# Patient Record
Sex: Female | Born: 1960 | Race: Black or African American | Hispanic: No | Marital: Single | Smoking: Never smoker
Health system: Southern US, Community
[De-identification: ages and names within clinical notes are randomized; demographics above are authoritative.]

## PROBLEM LIST (undated history)

## (undated) DIAGNOSIS — T4145XA Adverse effect of unspecified anesthetic, initial encounter: Secondary | ICD-10-CM

## (undated) DIAGNOSIS — R064 Hyperventilation: Secondary | ICD-10-CM

## (undated) DIAGNOSIS — J45909 Unspecified asthma, uncomplicated: Secondary | ICD-10-CM

## (undated) DIAGNOSIS — R059 Cough, unspecified: Secondary | ICD-10-CM

## (undated) DIAGNOSIS — R51 Headache: Secondary | ICD-10-CM

## (undated) DIAGNOSIS — T8859XA Other complications of anesthesia, initial encounter: Secondary | ICD-10-CM

## (undated) DIAGNOSIS — N189 Chronic kidney disease, unspecified: Secondary | ICD-10-CM

## (undated) DIAGNOSIS — R05 Cough: Secondary | ICD-10-CM

## (undated) DIAGNOSIS — R519 Headache, unspecified: Secondary | ICD-10-CM

## (undated) DIAGNOSIS — N390 Urinary tract infection, site not specified: Secondary | ICD-10-CM

## (undated) DIAGNOSIS — R091 Pleurisy: Secondary | ICD-10-CM

## (undated) DIAGNOSIS — G4700 Insomnia, unspecified: Secondary | ICD-10-CM

## (undated) DIAGNOSIS — F419 Anxiety disorder, unspecified: Secondary | ICD-10-CM

## (undated) HISTORY — PX: UTERINE FIBROID SURGERY: SHX826

## (undated) HISTORY — PX: VESICOVAGINAL FISTULA CLOSURE W/ TAH: SUR271

## (undated) HISTORY — DX: Cough: R05

## (undated) HISTORY — PX: BREAST CYST ASPIRATION: SHX578

## (undated) HISTORY — PX: LUMBAR SPINE SURGERY: SHX701

## (undated) HISTORY — DX: Cough, unspecified: R05.9

## (undated) HISTORY — DX: Pleurisy: R09.1

## (undated) HISTORY — PX: ABDOMINAL HYSTERECTOMY: SHX81

## (undated) HISTORY — DX: Hyperventilation: R06.4

## (undated) HISTORY — DX: Insomnia, unspecified: G47.00

## (undated) HISTORY — DX: Unspecified asthma, uncomplicated: J45.909

---

## 1997-12-03 ENCOUNTER — Emergency Department (HOSPITAL_COMMUNITY): Admission: EM | Admit: 1997-12-03 | Discharge: 1997-12-03 | Payer: Self-pay | Admitting: Internal Medicine

## 1998-03-17 ENCOUNTER — Other Ambulatory Visit: Admission: RE | Admit: 1998-03-17 | Discharge: 1998-03-17 | Payer: Self-pay | Admitting: Obstetrics and Gynecology

## 1998-05-19 ENCOUNTER — Ambulatory Visit (HOSPITAL_COMMUNITY): Admission: RE | Admit: 1998-05-19 | Discharge: 1998-05-19 | Payer: Self-pay | Admitting: Internal Medicine

## 1998-05-19 ENCOUNTER — Encounter: Payer: Self-pay | Admitting: Internal Medicine

## 1998-12-24 ENCOUNTER — Encounter: Payer: Self-pay | Admitting: Obstetrics and Gynecology

## 1998-12-24 ENCOUNTER — Ambulatory Visit (HOSPITAL_COMMUNITY): Admission: RE | Admit: 1998-12-24 | Discharge: 1998-12-24 | Payer: Self-pay | Admitting: Obstetrics and Gynecology

## 1999-01-20 ENCOUNTER — Encounter (INDEPENDENT_AMBULATORY_CARE_PROVIDER_SITE_OTHER): Payer: Self-pay

## 1999-01-20 ENCOUNTER — Inpatient Hospital Stay (HOSPITAL_COMMUNITY): Admission: RE | Admit: 1999-01-20 | Discharge: 1999-01-26 | Payer: Self-pay | Admitting: Obstetrics and Gynecology

## 1999-01-23 ENCOUNTER — Encounter: Payer: Self-pay | Admitting: Neurology

## 1999-01-23 ENCOUNTER — Encounter: Payer: Self-pay | Admitting: Obstetrics and Gynecology

## 1999-03-25 ENCOUNTER — Other Ambulatory Visit: Admission: RE | Admit: 1999-03-25 | Discharge: 1999-03-25 | Payer: Self-pay | Admitting: Obstetrics and Gynecology

## 2000-03-17 ENCOUNTER — Encounter (INDEPENDENT_AMBULATORY_CARE_PROVIDER_SITE_OTHER): Payer: Self-pay | Admitting: Specialist

## 2000-03-17 ENCOUNTER — Encounter: Payer: Self-pay | Admitting: *Deleted

## 2000-03-17 ENCOUNTER — Encounter: Admission: RE | Admit: 2000-03-17 | Discharge: 2000-03-17 | Payer: Self-pay | Admitting: *Deleted

## 2000-03-17 ENCOUNTER — Other Ambulatory Visit: Admission: RE | Admit: 2000-03-17 | Discharge: 2000-03-17 | Payer: Self-pay | Admitting: *Deleted

## 2000-08-05 ENCOUNTER — Emergency Department (HOSPITAL_COMMUNITY): Admission: EM | Admit: 2000-08-05 | Discharge: 2000-08-06 | Payer: Self-pay | Admitting: Emergency Medicine

## 2000-12-25 ENCOUNTER — Emergency Department (HOSPITAL_COMMUNITY): Admission: EM | Admit: 2000-12-25 | Discharge: 2000-12-25 | Payer: Self-pay | Admitting: Emergency Medicine

## 2000-12-25 ENCOUNTER — Encounter: Payer: Self-pay | Admitting: Emergency Medicine

## 2001-09-03 ENCOUNTER — Emergency Department (HOSPITAL_COMMUNITY): Admission: EM | Admit: 2001-09-03 | Discharge: 2001-09-03 | Payer: Self-pay | Admitting: Emergency Medicine

## 2002-12-05 ENCOUNTER — Encounter: Payer: Self-pay | Admitting: Emergency Medicine

## 2002-12-05 ENCOUNTER — Emergency Department (HOSPITAL_COMMUNITY): Admission: EM | Admit: 2002-12-05 | Discharge: 2002-12-05 | Payer: Self-pay | Admitting: Emergency Medicine

## 2003-05-10 ENCOUNTER — Encounter: Admission: RE | Admit: 2003-05-10 | Discharge: 2003-05-10 | Payer: Self-pay | Admitting: Obstetrics and Gynecology

## 2004-10-08 ENCOUNTER — Ambulatory Visit: Payer: Self-pay | Admitting: Internal Medicine

## 2004-11-09 ENCOUNTER — Ambulatory Visit: Payer: Self-pay | Admitting: Internal Medicine

## 2004-12-10 ENCOUNTER — Ambulatory Visit: Payer: Self-pay | Admitting: Internal Medicine

## 2004-12-30 ENCOUNTER — Ambulatory Visit: Payer: Self-pay | Admitting: Internal Medicine

## 2005-04-14 ENCOUNTER — Encounter (INDEPENDENT_AMBULATORY_CARE_PROVIDER_SITE_OTHER): Payer: Self-pay | Admitting: *Deleted

## 2005-04-14 ENCOUNTER — Inpatient Hospital Stay (HOSPITAL_COMMUNITY): Admission: RE | Admit: 2005-04-14 | Discharge: 2005-04-17 | Payer: Self-pay | Admitting: Obstetrics and Gynecology

## 2005-05-24 ENCOUNTER — Ambulatory Visit: Payer: Self-pay | Admitting: Internal Medicine

## 2005-06-04 ENCOUNTER — Ambulatory Visit: Payer: Self-pay | Admitting: Internal Medicine

## 2005-07-01 ENCOUNTER — Ambulatory Visit: Payer: Self-pay | Admitting: Internal Medicine

## 2005-07-13 ENCOUNTER — Ambulatory Visit: Payer: Self-pay | Admitting: Internal Medicine

## 2005-07-22 ENCOUNTER — Encounter (HOSPITAL_COMMUNITY): Admission: RE | Admit: 2005-07-22 | Discharge: 2005-10-20 | Payer: Self-pay | Admitting: Internal Medicine

## 2005-08-04 ENCOUNTER — Ambulatory Visit (HOSPITAL_COMMUNITY): Admission: RE | Admit: 2005-08-04 | Discharge: 2005-08-04 | Payer: Self-pay | Admitting: Obstetrics and Gynecology

## 2005-08-31 ENCOUNTER — Ambulatory Visit: Payer: Self-pay | Admitting: Internal Medicine

## 2005-09-29 ENCOUNTER — Encounter: Admission: RE | Admit: 2005-09-29 | Discharge: 2005-09-29 | Payer: Self-pay | Admitting: Obstetrics and Gynecology

## 2005-11-08 ENCOUNTER — Ambulatory Visit: Payer: Self-pay | Admitting: Internal Medicine

## 2005-11-09 ENCOUNTER — Ambulatory Visit: Admission: RE | Admit: 2005-11-09 | Discharge: 2005-11-09 | Payer: Self-pay | Admitting: Gynecologic Oncology

## 2005-11-23 ENCOUNTER — Encounter (INDEPENDENT_AMBULATORY_CARE_PROVIDER_SITE_OTHER): Payer: Self-pay | Admitting: *Deleted

## 2005-11-23 ENCOUNTER — Inpatient Hospital Stay (HOSPITAL_COMMUNITY): Admission: RE | Admit: 2005-11-23 | Discharge: 2005-11-26 | Payer: Self-pay | Admitting: Obstetrics and Gynecology

## 2005-12-15 ENCOUNTER — Encounter: Admission: RE | Admit: 2005-12-15 | Discharge: 2005-12-15 | Payer: Self-pay | Admitting: Obstetrics and Gynecology

## 2006-01-04 ENCOUNTER — Ambulatory Visit: Admission: RE | Admit: 2006-01-04 | Discharge: 2006-01-04 | Payer: Self-pay | Admitting: Gynecologic Oncology

## 2007-04-20 ENCOUNTER — Ambulatory Visit: Payer: Self-pay | Admitting: Pulmonary Disease

## 2007-04-27 ENCOUNTER — Encounter: Admission: RE | Admit: 2007-04-27 | Discharge: 2007-04-27 | Payer: Self-pay | Admitting: Obstetrics and Gynecology

## 2007-05-15 DIAGNOSIS — R091 Pleurisy: Secondary | ICD-10-CM | POA: Insufficient documentation

## 2007-05-15 DIAGNOSIS — J45909 Unspecified asthma, uncomplicated: Secondary | ICD-10-CM

## 2007-05-15 DIAGNOSIS — R05 Cough: Secondary | ICD-10-CM

## 2007-05-15 DIAGNOSIS — R064 Hyperventilation: Secondary | ICD-10-CM

## 2007-07-28 ENCOUNTER — Encounter: Payer: Self-pay | Admitting: Internal Medicine

## 2007-07-31 ENCOUNTER — Encounter: Payer: Self-pay | Admitting: Internal Medicine

## 2008-02-14 ENCOUNTER — Ambulatory Visit: Admission: RE | Admit: 2008-02-14 | Discharge: 2008-02-14 | Payer: Self-pay | Admitting: Gynecologic Oncology

## 2008-02-14 ENCOUNTER — Ambulatory Visit (HOSPITAL_COMMUNITY): Admission: RE | Admit: 2008-02-14 | Discharge: 2008-02-14 | Payer: Self-pay | Admitting: Gynecology

## 2008-05-14 ENCOUNTER — Ambulatory Visit: Payer: Self-pay | Admitting: Internal Medicine

## 2008-05-14 ENCOUNTER — Inpatient Hospital Stay (HOSPITAL_COMMUNITY): Admission: EM | Admit: 2008-05-14 | Discharge: 2008-05-17 | Payer: Self-pay | Admitting: Emergency Medicine

## 2008-05-20 ENCOUNTER — Ambulatory Visit: Payer: Self-pay | Admitting: Internal Medicine

## 2008-05-20 DIAGNOSIS — G4700 Insomnia, unspecified: Secondary | ICD-10-CM | POA: Insufficient documentation

## 2008-05-24 ENCOUNTER — Telehealth (INDEPENDENT_AMBULATORY_CARE_PROVIDER_SITE_OTHER): Payer: Self-pay | Admitting: *Deleted

## 2008-07-20 ENCOUNTER — Emergency Department (HOSPITAL_COMMUNITY): Admission: EM | Admit: 2008-07-20 | Discharge: 2008-07-20 | Payer: Self-pay | Admitting: Emergency Medicine

## 2009-02-20 ENCOUNTER — Ambulatory Visit: Payer: Self-pay | Admitting: Internal Medicine

## 2009-03-12 ENCOUNTER — Encounter: Admission: RE | Admit: 2009-03-12 | Discharge: 2009-03-12 | Payer: Self-pay | Admitting: Obstetrics and Gynecology

## 2009-12-23 ENCOUNTER — Telehealth: Payer: Self-pay | Admitting: Internal Medicine

## 2009-12-23 ENCOUNTER — Ambulatory Visit: Payer: Self-pay | Admitting: Internal Medicine

## 2010-04-09 ENCOUNTER — Encounter: Admission: RE | Admit: 2010-04-09 | Discharge: 2010-04-09 | Payer: Self-pay | Admitting: Obstetrics and Gynecology

## 2010-07-19 ENCOUNTER — Encounter: Payer: Self-pay | Admitting: Family Medicine

## 2010-07-19 ENCOUNTER — Encounter: Payer: Self-pay | Admitting: Obstetrics and Gynecology

## 2010-07-20 ENCOUNTER — Encounter: Payer: Self-pay | Admitting: Obstetrics and Gynecology

## 2010-07-20 ENCOUNTER — Encounter: Payer: Self-pay | Admitting: Gynecology

## 2010-07-28 NOTE — Progress Notes (Signed)
Summary: appt  Phone Note Call from Patient Call back at Work Phone [phone]   Caller: Patient Call For: Marca Gadsby Reason for Call: Talk to Nurse Summary of Call: bronchial asthma, sob,cough chest pain, head congestion x 1 week.  Pt would like to see CDY today. Initial call taken by: Eugene Gavia,  December 23, 2009 9:03 AM  Follow-up for Phone Call        called and spoke with pt.  pt c/o headache, increased sob, tightness in chest, coughing up yellow sputum with occ streaks of bright red blood, fever yesterday of 102, sore throat and hoarseness.  symptoms started 1 week ago.  Pt would like to be seen today by CY. No appts avail.  Please advise.  Thanks.  allergies:  reglan, doxycycline, compazine, phenothiazine  ******* SICK   ***********   SICK ****************   SICK ***************  Additional Follow-up for Phone Call Additional follow up Details #1::        Please have pt come in today at 345pm.Katie [facility] CMA  December 23, 2009 11:04 AM    called and spoke with pt and she is aware per CY to come in today at 3:45.  pt voiced her understanding Randell Loop CMA  December 23, 2009 11:21 AM

## 2010-07-28 NOTE — Assessment & Plan Note (Signed)
Summary: cough/congestion/fever/pt here at 3:45 per CY/la   Primary Provider/Referring Provider:  none  CC:  ACCUTE:headache; cough-blood x 1 week; pain in chest; sore throat and hoarseness;feverish..  History of Present Illness: 04/20/07- HISTORY OF PRESENT ILLNESS:  The patient is a 50 year old African- American female patient of Dr. Maple Hudson who has a known history of asthma versus recurrent cough with vocal cord dysfunction syndrome who presents today for an acute office visit.  The patient complains that over the last two weeks, she has had cough, congestion, thick mucous, fever initially at TMAX of 103, now resolved over the last several days, hoarseness, and wheezing.  The patient denies any hemoptysis, orthopnea, PND, exertional chest pain, calf pain, or tenderness.  No recent travel or antibiotic use.  The patient has not been seen in the office in greater than a year and a half.  Reports that she has been doing exceptionally well up until recently.  CXR 04/20/07- NAD.  05/20/08- Asthma, VCD/ cough Hosp at [facility] last week for asthma after flu syndrome. C/O headache from sleep loss, chest sore, dry cough. Cough worse supine. Finished abx, Tamiflu, pred. Asks sleep aid. No flu vax yet.  17-Mar-2009- asthma, VCD/ cough 4 days acutely ill with fever, cough, nausea/ vomiting, hoarseness- no sick exposure. Says getting sicker daily. Using her albuterol twice daily, no other meds for this. Limited intake, mostly liquids. Thinks she coughed up some dark red blood 3-4 days ago, which she has done before with very hard cough.  December 23, 2009- Asthma, VCD/ cough Acute visit- 1 week of harsh cough- her typical pattern. Denies cold, heart burn or much time outdoors. She doesn't know what started it. Rescue inhaler does help. Went to urgent care- temp 103. Given Zpak, tessalon and depo inj.  CXR- told it was clear.Finished Zpak 4 days ago., Cough mostly dry. Has coughed some blood streaks in mucus a  few days. Tussive soreness mid anterior chest. Had itching rash on arm 2 weeks ago, now resolved. Onset was before meds. She has had epoisodes of itching in past with no rash.   Asthma History    Initial Asthma Severity Rating:    Age range: 12+ years    Symptoms: throughout the day    Nighttime Awakenings: 0-2/month    Interferes w/ normal activity: no limitations    SABA use (not for EIB): 0-2 days/week    Asthma Severity Assessment: Severe Persistent   Preventive Screening-Counseling & Management  Alcohol-Tobacco     Smoking Status: never  Current Medications (verified): 1)  Albuterol Sulfate (2.5 Mg/23ml) 0.083% Nebu (Albuterol Sulfate) .Marland Kitchen.. 1 Four Times A Day As Needed 2)  Temazepam 15 Mg Caps (Temazepam) .Marland Kitchen.. 1 or 2 For Sleep Occasionally, If Needed 3)  Proair Hfa 108 (90 Base) Mcg/act Aers (Albuterol Sulfate) .... 2 Puffs Four Times A Day As Needed  Allergies (verified): 1)  ! Reglan 2)  ! Doxycycline 3)  ! Compazine 4)  ! 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   Physical Exam  Additional Exam:  General: A/Ox3; pleasant and cooperative, NAD, heavy wig SKIN: no rash, lesions NODES: no lymphadenopathy HEENT: [place]/AT, EOM- WNL, Conjuctivae- clear, PERRLA, TM-WNL, Nose- clear, Throat- clear and wnl, very hoarse, , no drainage or exudate NECK: Supple w/ fair ROM, JVD- none, normal carotid impulses w/o bruits Thyroid- , no stridor CHEST: harsh paroxysmal cough HEART: RRR, no m/g/r heard ABDOMEN: Soft and nl; EAV:WUJW, nl pulses, no edema  NEURO: Grossly intact to observation      Impression & Recommendations:  Problem # 1:  COUGH (ICD-786.2)  Acute tracheobronchitis. These episodes start with fever and exertional dyspnea, suggesting a viral infection then an asthmatic bronchitis type progression. We will give tussionex and prednisone taper which she says work usually. We had gone through an aggressive workup 7-8 years ago with bronchoscopy and eval for GERD, which had not resulted in better options. for management.  Medications Added to Medication List This Visit: 1)  Tussionex Pennkinetic Er 8-10 Mg/44ml Lqcr (Chlorpheniramine-hydrocodone) .Marland Kitchen.. 1 teaspoon two times a day as needed cough 2)  Prednisone 10 Mg Tabs (Prednisone) .Marland Kitchen.. 1 tab four times daily x 2 days, 3 times daily x 2 days, 2 times daily x 2 days, 1 time daily x 2 days  Other Orders: Est. Patient Level III (11914)  Patient Instructions: 1)  Keep appointment in August 2)  scripts for prednisone taper and tussionex Prescriptions: PREDNISONE 10 MG TABS (PREDNISONE) 1 tab four times daily x 2 days, 3 times daily x 2 days, 2 times daily x 2 days, 1 time daily x 2 days  #20 x 0   Entered and Authorized by:   Waymon Budge MD   Signed by:   Waymon Budge MD on 12/23/2009   Method used:   Print then Give to Patient   RxID:   [id number] TUSSIONEX PENNKINETIC ER 8-10 MG/5ML LQCR (CHLORPHENIRAMINE-HYDROCODONE) 1 teaspoon  two times a day as needed cough  #200 ml x 1   Entered and Authorized by:   Waymon Budge MD   Signed by:   Waymon Budge MD on 12/23/2009   Method used:   Print then Give to Patient   RxID:   [id number]

## 2010-08-07 ENCOUNTER — Other Ambulatory Visit: Payer: Self-pay | Admitting: Family Medicine

## 2010-08-07 DIAGNOSIS — M79671 Pain in right foot: Secondary | ICD-10-CM

## 2010-08-15 ENCOUNTER — Other Ambulatory Visit: Payer: Self-pay

## 2010-11-10 NOTE — Assessment & Plan Note (Signed)
[facility]                             PULMONARY OFFICE NOTE   NAME:[name], [name]                       MRN:          [id number]  DATE:04/20/2007                            DOB:          1961-02-21    HISTORY OF PRESENT ILLNESS:  The patient is a 50 year old African-  American female patient of Dr. Maple Hudson who has a known history of asthma  versus recurrent cough with vocal cord dysfunction syndrome who presents  today for an acute office visit.  The patient complains that over the  last two weeks, she has had cough, congestion, thick mucous, fever  initially at TMAX of 103, now resolved over the last several days,  hoarseness, and wheezing.  The patient denies any hemoptysis, orthopnea,  PND, exertional chest pain, calf pain, or tenderness.  No recent travel  or antibiotic use.  The patient has not been seen in the office in  greater than a year and a half.  Reports that she has been doing  exceptionally well up until recently.   PAST MEDICAL HISTORY:  Reviewed.   CURRENT MEDICATIONS:  Reviewed.   PHYSICAL EXAMINATION:  GENERAL:  The patient is a pleasant female in no  acute distress.  VITAL SIGNS:  She is afebrile with stable vital signs.  O2 saturations  100% on room air.  HEENT:  Nasal mucosa with some mild erythema.  Nontender sinuses.  Posterior pharynx is clear.  NECK:  Supple without cervical adenopathy.  No JVD.  Negative nuchal  rigidity.  LUNGS:  Coarse breath sounds bilaterally with a few expiratory wheezes.  HEART:  Regular rate.  ABDOMEN:  Soft and nontender.  EXTREMITIES:  Warm without any calf cyanosis, clubbing, or edema.   IMPRESSION:  Asthmatic bronchitic exacerbation.  Chest x-ray is pending  at the time of dictation.  The patient is begin Levaquin 750 mg x5 days,  Mucinex DM twice a day, Tussionex 8 ounces one teaspoon every 12 hours  as needed for cough control.  Prednisone taper over the next week.  The  patient will  return back with Dr. Maple Hudson in one month or sooner if  needed.      Rubye Oaks, NP  Electronically Signed      Clinton D. Maple Hudson, MD, Tonny Bollman, FACP  Electronically Signed   TP/MedQ  DD: 04/20/2007  DT: 04/21/2007  Job #: [id number]

## 2010-11-10 NOTE — Consult Note (Signed)
NAME:  [name], [name]                ACCOUNT NO.:  [id number]   MEDICAL RECORD NO.:  [id number]          PATIENT TYPE:  OUT   LOCATION:  GYN                          FACILITY:  [facility]   PHYSICIAN:  John T. Kyla Balzarine, M.D.    DATE OF BIRTH:  [birth year]   DATE OF CONSULTATION:  DATE OF DISCHARGE:                                 CONSULTATION   CHIEF COMPLAINT:  Persistent abdominal pain presenting over the past 6  weeks.   HISTORY OF PRESENT ILLNESS:  This patient underwent an exploratory  laparotomy performed by Dr. Ambrose Mantle and myself in May 2007, approximately  6 months after TAH and BSO.  She was found to have extensive adhesions  and underwent excision of a likely left pelvic ovarian remnant.  During  that surgery, we explored the right side wall and at that time there was  no indication of residual ovarian tissue.  The patient states that she  was doing well until approximately 6 weeks ago when she presented for  routine annual examination with complaints of fever and tender right  lower abdomen.  She was found to have urinary tract infection, treated  with antibiotics.  This was initially Macrobid, but she had an allergic  reaction and was changed to Cipro.  A course of Cipro was repeated one  month later for persistent bacteruria, though culture returned mixed  flora with low colony count.  The patient notes that since treatment for  urinary tract infection, she has had bilateral lower quadrant  throbbing abdominal pain which increases with activities such as  walking and intercourse.  She has had two days off of work because of  her pain.  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  She denies leg swelling or other symptoms.  She  denies obstructive bowel symptoms or change in bowel function.   PAST MEDICAL HISTORY:  Migraine headaches, borderline asthma with  environmental  allergies, prior postoperative pneumonia.  Remote history  of endometriosis, TAH/BSO and exploration for ovarian remnant as above.  Prior back surgery.   CURRENT MEDICATIONS:  1. Estradiol with tizanidine p.r.n. for migraine headaches.  2. She is using Tylenol over-the-counter for her pain.   ALLERGIES:  ANAPHYLAXIS WITH COMPAZINE, REGLAN, PHENOTHIAZINES AND  ERYTHROMYCIN.  QUESTIONABLE ALLERGY TO DOXYCYCLINE, AND SOME TYPE OF  REACTION TO MACROBID, UNKNOWN TYPE.   PERSONAL AND SOCIAL HISTORY:  Single and denies tobacco or significant  ethanol use.   FAMILY HISTORY:  Father with prostate cancer, but no known breast,  gynecologic or colon malignancies.   REVIEW OF SYSTEMS:  Otherwise, essentially negative comprehensive 10-  point review.   PHYSICAL EXAMINATION:  VITAL SIGNS:  Stable and afebrile with weight 160  pounds.  The patient is anxious, alert and oriented x3 in no acute  distress.  There is no supraclavicular or inguinal lymphadenopathy.  Back:  No spinous tenderness, but minimal tenderness to percussion in  the right CVA.  ABDOMEN:  Soft and benign with no distention,  ascites, mass or hernias  palpable.  Transverse incision is well-healed.  There is some fullness  in the right lower quadrant with slight tenderness.  No peritoneal  signs.  EXTREMITIES:  Full strength and range of motion without cords, Homans'  or edema.  PELVIC:  Vaginal vault, bladder and urethra and vulvar clear.  Bimanual  and rectovaginal examinations reveals minimally tender fullness in the  right adnexal region.  Because of voluntary guarding, no distinct mass  is palpable.   ASSESSMENT:  Recurrent lower abdominal/pelvic pain.  Prior history of  ovarian remnant cyst.   PLAN AND RECOMMENDATIONS:  We will obtain a pelvic ultrasound.  Urine  culture is repeated along with CBC and Chem panel.  Disposition will  depend on these findings.      John T. Kyla Balzarine, M.D.  Electronically Signed      JTS/MEDQ  D:  02/14/2008  T:  02/14/2008  Job:  [id number]   cc:   Telford Nab, R.N.  [address]  [place], [place] [address]   Malachi Pro. Ambrose Mantle, M.D.  Fax: [phone]

## 2010-11-10 NOTE — Discharge Summary (Signed)
[name], [name]                ACCOUNT NO.:  [id number]   MEDICAL RECORD NO.:  [id number]          PATIENT TYPE:  INP   LOCATION:  3740                         FACILITY:  [facility]   PHYSICIAN:  Madaline Guthrie, M.D.    DATE OF BIRTH:  [birth year]   DATE OF ADMISSION:  05/14/2008  DATE OF DISCHARGE:  05/17/2008                               DISCHARGE SUMMARY   DISCHARGE DIAGNOSES:  1. Acute asthma exacerbation.  2. Migraine headache.   DISCHARGE MEDICATIONS:  1. Tamiflu 75 mg 1 tablet twice a day for 2 more days.  2. Avelox 400 mg, take 1 tablet daily for 4 more days.  3. Tizanidine 4 mg, take 1 tablet every 6 hours as needed for migraine      headache.  4. Albuterol inhaler 1 puff every 4 hours as needed for shortness of      breath.  5. Prednisone, take 50 mg today; decreased by 10 mg daily for the next      5 days then stop.   The patient was instructed to stop taking the following medication:  Advair.   DISPOSITION AND FOLLOWUP:  [name] is being discharged in stable  condition.  She will see Dr. Maple Hudson of [facility] on May 20, 2008, at 3 p.m. to follow up on asthma management.  She will follow  up with Dr. Anne Fu [facility] on May 29, 2008, at 10:30 to follow up on management of her migraine headache.  She will follow up with Dr. Elizebeth Koller at the [facility] on June 10, 2008, at 3 p.m. for primary care followup.   PROCEDURES PERFORMED:  1. Chest x-ray, May 14, 2008, shows minimal atelectasis at lung      bases.  Negative for pneumonia.  No acute process.  2. Chest x-ray, May 15, 2008, shows improved lung volumes and      ventilation.  No significant change from previous.  3. Acute abdominal series, May 15, 2008, shows nonobstructive      bowel gas pattern.   CONSULTATIONS:  None.   HISTORY AND PHYSICAL:  [name] is a 50 year old female with past  medical history significant for  asthma, migraine headache, and recent  surgery for abdominal adhesion excision, who presents to the emergency  department after several days of increasing shortness of breath and  cough.   HOSPITAL COURSE:  1. Asthma exacerbation.  [name] is treated with continuous      albuterol nebulizer in the emergency department and was initially      admitted to the step-down unit.  She was started on IV Solu-Medrol,      continued on albuterol nebulizer treatments every 6 hours.  She was      started on Tamiflu and Avelox to cover possible underlying      influenza or pneumonia, which may have triggered this asthma      exacerbation.  She was switched to oral prednisone on day 3 of      hospitalization and was discharged on day 4 with 5-day  taper.  At      that time, her breathing was much improved.  Her Advair was      discontinued while she was in hospital as there has known in some      cases to trigger asthma exacerbation, and indeed she complained      that her Advair Diskus often made her cough.  2. Migraine headache.  In hospital, [name] reported a retro-      orbital headache consistent with her usual migraine headaches.  [facility]      [facility] and in the past has not      responded to sumatriptan.  She reported she had tried many      different medications for migraine headache and her pain is only      relieved by tizanidine and indeed upon receiving tizanidine in the      hospital, her symptoms resolved.   DISCHARGED LABS AND VITALS:  Temperature 98.5, blood pressure 118/69,  pulse 54, respirations 18, oxygen saturation 98% on room air.  Sodium  141, potassium 3.5, chloride 114, bicarb 20, BUN 11, creatinine 0.93,  glucose 106.  White blood cells 12.0, hemoglobin 10.1, hematocrit 29.5,  platelets 172.      Elby Showers, MD  Electronically Signed      Madaline Guthrie, M.D.  Electronically Signed    CW/MEDQ  D:  07/13/2008  T:  07/13/2008   Job:  [id number]

## 2010-11-13 NOTE — H&P (Signed)
NAME:  [name], STUEVE NO.:  [id number]   MEDICAL RECORD NO.:  [id number]          PATIENT TYPE:  INP   LOCATION:  NA                           FACILITY:  [facility]   PHYSICIAN:  Malachi Pro. Ambrose Mantle, M.D. DATE OF BIRTH:  1961-01-07   DATE OF ADMISSION:  04/14/2005  DATE OF DISCHARGE:                                HISTORY & PHYSICAL   HISTORY OF PRESENT ILLNESS:  This is a 50 year old, black, single female, P0  who is admitted to the hospital for hysterectomy with possible bilateral  salpingo-oophorectomy because of severe menorrhagia, dysmenorrhea, history  of myomectomies, pelvic pain, dyspareunia and history of pelvic  endometriosis.  Last menstrual period April 08, 2005, to the present.  The  patient states she has been having periods approximately every 2-3 weeks  lasting for more than a week at a time.  In September 2006, I attempted to  do a biopsy, but was unable to pass the sound through the uterine cervix.  Therefore, a D&C will be done prior to the hysterectomy.  When the patient  was seen in September 2006, the patient reported that she had with her  menstrual period 8-10 pads a day and rated her pain 7-8/10 and occasionally  10/10.  She also reported having dyspareunia and stated that she was using 6-  8 pain pills per day.  Examination was somewhat difficult, but I could not  be sure the uterus was enlarged, but I did offer her Lupron, but she wanted  to proceed with hysterectomy.   ALLERGIES:  REGLAN, COMPAZINE, DOXYCYCLINE AND PHENOTHIAZINES.   CURRENT MEDICATIONS:  She states she is taking no medications.   SOCIAL HISTORY:  She does not drink.  She does not smoke.   PAST MEDICAL HISTORY:  1.  She has had no major adult illness, but she has had a long history of      headaches thought to be migraine.  2.  She has no known heart problems.  3.  She had multiple laparoscopies in 1981, 1987 and 1995, all of which were      associated with laser  ablation of endometriosis.   PAST SURGICAL HISTORY:  1.  Laparoscopies on at least three occasions.  2.  Laparotomy with removal of 14 fibroids and a left ovarian cyst in 2000.  3.  Laminectomy.   FAMILY HISTORY:  Mother is 36 with diabetes, glaucoma, high blood pressure  and headaches.  Father died at 70 of prostate cancer.  One sister living and  well.  Four brothers apparently living and well.   PHYSICAL EXAMINATION:  GENERAL:  Well-developed, well-nourished, black  female.  VITAL SIGNS:  Height 5 feet 5 inches tall.  Weight 190 pounds.  Blood  pressure 140/80, pulse 60.  HEENT:  No cranial abnormalities.  Extraocular movements intact.  Nose and  pharynx clear.  NECK:  Supple without thyromegaly.  HEART:  Normal size and sounds.  No murmurs.  LUNGS:  Clear to auscultation.  Breaths soft without masses lying down or  sitting up.  ABDOMEN:  Soft.  There is some  lower quadrant direct tenderness with no  rebound.  It is difficult to tell whether there is a lower abdominal mass.  PELVIC:  The vulva and vagina are clean.  There is some menstrual blood  still present.  Pap smear on February 18, 2005, was within normal limits.  The  cervix is clean.  The uterus is of uncertain size, somewhat tender to  motion.  Adnexa with no masses palpable.  RECTAL:  Exam done in September without masses.   IMPRESSION:  1.  Pelvic pain.  2.  Severe dysmenorrhea and menorrhagia.  3.  Dyspareunia.  4.  History of pelvic endometriosis treated on three occasions with      laparoscopic laser ablation and a small amount of endometriosis noted at      the time of her multiple myomectomies and one of the implants on the      right uterosacral ligament at that time was cauterized.   PLAN:  The patient is admitted for [facility] and hysterectomy.  Removal of the  tubes and ovaries will be done if the patient's pelvic evaluation reveals a  process in which leaving the ovaries would work to her disadvantage such as   extensive endometriosis.  The patient, because of her prior myomectomies,  has had a bowel prep in case dissection of the bowel is necessary to reduce  the likelihood of colostomy in case large bowel injury is encountered.  The  patient has been informed of the risks of surgery including, but not limited  to heart attack, stroke, pulmonary embolus, wound disruption, hemorrhage  with need for reoperation and/or transfusion, fistula formation, nerve  injury, intestinal obstruction.  She understands and agrees to proceed.  She  states that her sex drive at age 56 was approximately once a month.  Now she  claims a sex drive of 3-4 times per week.  She has been informed that no  guarantee can be given as to the maintenance of her sex drive that she will  have to be prepared if the sex drive could diminished and that there is no  FDA approved medication for stimulating a woman's sex drive.  She  understands and agrees to proceed.      Malachi Pro. Ambrose Mantle, M.D.  Electronically Signed     TFH/MEDQ  D:  04/13/2005  T:  04/13/2005  Job:  [id number]

## 2010-11-13 NOTE — Discharge Summary (Signed)
NAMEMIDORI, DADO NO.:  [id number]   MEDICAL RECORD NO.:  [id number]          PATIENT TYPE:  INP   LOCATION:  1606                         FACILITY:  [facility]   PHYSICIAN:  Malachi Pro. Ambrose Mantle, M.D. 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  The patient underwent abdominal hysterectomy with BSO.  Findings at the time of surgery were multiple adhesions in the pelvis with  omental adhesions to the abdominal wall, adhesions of the omentum and the  bowel, to the uterus, adhesions of the ovaries to the pelvic wall, adhesions  of the right tube to the omentum.  The patient underwent D&C, laparotomy,  division of adhesions, abdominal hysterectomy and bilateral salpingo-  oophorectomy.  Postoperatively the patient's main complaint initially with  migraines.  She has been bothered with migraines for many years.  On the  second postoperative day, she did not want to go home.  She was ready to go  home on the third postoperative day and was discharged.  Initial hemoglobin  11.4, hematocrit 33.6, white count 4600, platelet count 261,000.  Followup  hematocrits were 27.9 and 25.1.  Differential was normal.  Comprehensive  metabolic profile was normal.  Urinalysis negative.  EKG and chest were not  done.  Pathology report is available.  The D&C showed benign fragments of  squamous mucosa and scant fragments of endocervical glands, benign fragments  of lower uterine segment endometrium, negative for hyperplasia or  malignancy, weakly reactive benign endometrium.  No evidence  of hyperplasia  malignancy or definitive features of polyp.  Uterus with focal cervical  squamous metaplasia, proliferative endometrium.  No evidence of hyperplasia,  malignancy or definitive reachers of polyp.  Myometrium with extensive  adenomyosis and leiomyomata measuring up to 2 cm.  Serosal surfaces abundant  hemorrhagic adhesions and focal glandular inclusions most consistent with  endometriosis.  Smaller ovary and fallopian tube, histologically  unremarkable tube with tubo-ovarian adhesions, corpus luteum and cystic  follicle on the ovary.  Larger ovary associated with a fallopian tube,  hemorrhagic corpus luteum and a cystic follicle with associated fallopian  tube.   FINAL DIAGNOSES:  1.  Menorrhagia.  2.  Dysmenorrhea.  3.  Abnormal bleeding.  4.  Dyspareunia.  5.  Pelvic pain.  6.  Adenomyosis.  7.  Leiomyomata uteri.  8.  Dense pelvic adhesions.   OPERATION:  Dilatation and curettage, laparotomy, abdominal hysterectomy,  bilateral salpingo-oophorectomy and division of adhesions.   FINAL CONDITION:  Improved.   DISCHARGE INSTRUCTIONS:  Instructions include our regular discharge  instructions.  No vaginal entrance, no heavy lifting or strenuous activity.  Call with any fever above 100.4 degrees.  Call with any unusual problems.  Return to the office within a week for followup  examination.      Malachi Pro. Ambrose Mantle, M.D.  Electronically Signed     TFH/MEDQ  D:  04/23/2005  T:  04/23/2005  Job:  [id number]

## 2010-11-13 NOTE — Op Note (Signed)
[name], [name] NO.:  [id number]   MEDICAL RECORD NO.:  [id number]          PATIENT TYPE:  INP   LOCATION:  0004                         FACILITY:  [facility]   PHYSICIAN:  Malachi Pro. Ambrose Mantle, M.D. DATE OF BIRTH:  [birth year]   DATE OF PROCEDURE:  04/14/2005  DATE OF DISCHARGE:                                 OPERATIVE REPORT   PREOPERATIVE DIAGNOSES:  1.  Menorrhagia.  2.  Dysmenorrhea.  3.  Dyspareunia.  4.  History of multiple myomectomies.  5.  History of laser ablation of pelvic endometriosis.   POSTOPERATIVE DIAGNOSES:  1.  Menorrhagia.  2.  Dysmenorrhea.  3.  Dyspareunia.  4.  History of multiple myomectomies.  5.  History of laser ablation of pelvic endometriosis.  6.  Innumerable adhesions involving the omentum to the abdominal wall, the      omentum to the uterus, flimsy adhesions in the cul-de-sac, adhesions of      the bowel to the uterus, dense adherence of both ovaries to the pelvic      walls, adhesions of the right tube to the omentum.   OPERATION:  Fractional D&C, laparotomy, division of multiple adhesions,  abdominal hysterectomy bilateral salpingo-oophorectomy.   OPERATOR:  Malachi Pro. Ambrose Mantle, M.D.   ASSISTANT:  Zenaida Niece, M.D.   ANESTHESIA:  General anesthesia.   The patient was brought to the operating room and placed under satisfactory  general anesthesia. The vulva and vagina and urethra were prepped with  Betadine solution, a Foley catheter was inserted to straight drain. The  cervix was identified, drawn into the operative field and the uterus was  sounded to about 3+ inches anteriorly. The cervical os was dilated  significantly so that I could easily insert endometrial curettes. I then did  an endocervical curettage followed by an endometrial curettage and the  specimen was sent for frozen section. The patient was placed supine, the  abdomen was prepped with Betadine solution and draped as a sterile field. A  transverse  incision was made through the old scar and carried in layers  through the skin, subcutaneous tissue and fascia. The fascia was then  separated from the rectus muscles superiorly and inferiorly. The rectus  muscle was very densely adherent and it was cut in the midline. On entering  the abdominal cavity through the peritoneum which was opened vertically, it  was apparent that there were adhesions of the omentum to the abdominal wall  and these were divided between clamps and suture ligated. Exploration of the  pelvis revealed a multinodular uterus that was very densely adherent to the  omentum, to the bowel and with also sort of peritoneal like flimsy  adhesions. The adhesions were divided by a combination of sharp dissection,  Bovie dissection and finger dissection. I could not identify the ovaries, so  I started by doing a hysterectomy, clamping across the upper pedicles,  elevating the uterus into the operative field, dividing both round  ligaments, creating a bladder flap and then dividing the upper pedicles  between clamps and doubly suture ligating them. I worked myself  down the  uterus, reached the uterosacral ligaments, clamped, cut and suture ligated  them and then entered both angles of the vagina. I removed the uterus by  transecting the upper vagina, placed. vaginal angled sutures and then closed  the central portion of the cuff with interrupted figure-of-eight sutures of  #0 Vicryl. I obtained hemostasis, sutured the uterosacral ligaments together  in the midline and then began the diligent search for the ovaries. The right  ovary was densely adherent to the right pelvic wall and it was also densely  adherent to the omentum. The only way I could begin to remove it was by  creating a window inferior to the ovary where it was densely adherent to the  broad ligament. After I created this window, I was able to dissect the ovary  up, doubly clamped the infundibulopelvic ligament and  divide it and then  double suture ligate. I was then able to separate the ovary from the  omentum. The right tube was densely adherent to the omentum and required a  tremendous amount of dissection to remove it from its omental attachments.  The right tube and ovary were then fully removed, hemostasis was adequate. I  identified the right ureter and felt it was normal. The left tube and ovary  were less difficult to remove but still required a significant amount of  dissection to remove them from the left ovary's attachment to the left  pelvic wall. A few fragments of the ovarian tissue were left even after I  had doubly ligated the left infundibulopelvic ligament and removed the major  part of the left tube and ovary. By pulling up on the fragments with an  Allis clamp, I felt like I removed the rest of the ovarian tissue.  Hemostasis was deemed adequate after some electrical current use. Liberal  irrigation of the pelvis revealed complete hemostasis. I had did not examine  the upper abdomen. I tried to establish full hemostasis, both on the omentum  and the rectus muscle and then I closed the rectus muscle with the  interrupted sutures of #0 Vicryl incorporating the peritoneum with the  rectus muscle. I then closed the fascia with two running sutures of #0  Vicryl, the subcu with a running 3-0 Vicryl and the skin was closed with  automatic staples. The patient seemed to tolerate the procedure well. Blood  loss about 750 mL. It was an extremely difficult dissection and surgery. The  patient was returned to recovery in satisfactory condition.      Malachi Pro. Ambrose Mantle, M.D.  Electronically Signed     TFH/MEDQ  D:  04/14/2005  T:  04/14/2005  Job:  [id number]

## 2010-11-13 NOTE — Discharge Summary (Signed)
NAME:  [name], [name]                ACCOUNT NO.:  [id number]   MEDICAL RECORD NO.:  [id number]          PATIENT TYPE:  INP   LOCATION:  1612                         FACILITY:  [facility]   PHYSICIAN:  Malachi Pro. Ambrose Mantle, M.D. DATE OF BIRTH:  1960-11-28   DATE OF ADMISSION:  11/23/2005  DATE OF DISCHARGE:  11/26/2005                                 DISCHARGE SUMMARY   HISTORY:  This is a 50 year old black female who was admitted to the  hospital because of longstanding pelvic pain and had abdominal hysterectomy,  BSO in October 2006, but now CT scan of the abdomen and pelvic ultrasound  revealed a cyst of the left adnexal region measuring approximately 5 cm.  The patient was admitted by Dr. Kyla Balzarine.  She underwent laparotomy with  findings of pelvic adhesions, and the operation included lysis of bowel  adhesions, omental adhesions, partial omentectomy, resection of left ovarian  remnant.  The patient did well postoperatively.  She returned to passing  flatus fairly promptly.  Her abdomen remained soft and nontender.  She was  treated with pain medication, and on the third postoperative day, she seemed  to be a candidate for discharge her initial hemoglobin was 11.0, hematocrit  32.2, white count 7900, platelet count 245,000.  Follow-up hemoglobin 10.1,  hematocrit 30.2; there were 83 segs, 13 lymphs, 4 monos, 1 eosinophil.  Comprehensive metabolic profile was normal.  The patient was B+ with a  negative antibody.  Path report showed omentum with fibroadipose tissue with  endometriosis, adherent portion of fallopian tube.  There was also soft  tissue from the left pelvis that showed residual ovarian tissue with  inclusion cysts and extensive adhesions. The chest x-ray done on May 24, 2005 showed mild cardiomegaly, mild peribronchial thickening without  focal airspace disease, the lungs were otherwise clear.  EKG done in January  of 2007 showed a normal EKG.   FINAL DIAGNOSES:  1.  Left  ovarian remnant with pelvic pain.  2.  Multiple adhesions to the bowel and to the pelvic mass.  3.  Omental adhesions to the pelvic mass; the omentum included a portion of      the fimbriated end of the fallopian tube.  4.  Endometriosis.   OPERATION:  Laparotomy with removal of part of the omentum, the left ovarian  remnant and division of adhesions and resection of a portion of the  abdominal wall.   FINAL CONDITION:  Improved.   INSTRUCTIONS:  Include regular diet, no vaginal entrance, no heavy lifting  or strenuous activity.  Call with any fever.  Call with any unusual  problems.  Otherwise return to see Telford Nab in three to four days to  have her staples removed.   MEDICATIONS AT DISCHARGE:  She is to continue the medications that she came  with and Vicodin 5/500, 30 tablets, one every 4-6 hours as needed for pain  and Zofran 4 mg 20 tablets one 3 times a day as needed for nausea.      Malachi Pro. Ambrose Mantle, M.D.  Electronically Signed     TFH/MEDQ  D:  11/26/2005  T:  11/26/2005  Job:  [id number]

## 2010-11-13 NOTE — Op Note (Signed)
NAME:  [name], [name]                ACCOUNT NO.:  [id number]   MEDICAL RECORD NO.:  [id number]          PATIENT TYPE:  INP   LOCATION:  1612                         FACILITY:  [facility]   PHYSICIAN:  John T. Kyla Balzarine, M.D.    DATE OF BIRTH:  27-Jan-1961   DATE OF PROCEDURE:  11/23/2005  DATE OF DISCHARGE:                                 OPERATIVE REPORT   SURGEON:  Ronita Hipps M.D.   ASSISTANT:  Hilbert Bible, M.D.   PREOPERATIVE DIAGNOSIS:  Left pelvic cyst, likely ovarian remnant.   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   PROCEDURE:  Exploratory laparotomy with lysis of adhesions and resection of  adherent omentum, left ureterolysis, excision of likely left pelvic ovarian  remnant.   ANESTHESIA:  General endotracheal.   DESCRIPTION OF FINDINGS AND INDICATIONS FOR SURGERY:  This patient underwent  total abdominal hysterectomy with BSO approximately 6 months ago.  She had a  persistent symptomatic left adnexal cyst.  She was intolerant of progestin  therapy and did not wish trial of Lupron.   Examination under anesthesia confirmed a cystic fullness in the left  posterior pelvis.  On entering the abdomen, there were multiple adhesions  between bowel, omentum and pelvis, with omentum adherent to the vaginal  cuff.  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  The mass was resected completely and the left  infundibulopelvic ligament was ligated high above the pelvic brim.   The mass  was adherent to the muscularis of the bladder and obliterated the left  ovarian vascular pedicle, requiring electrocautery and suture ligation for  hemostasis.   DESCRIPTION OF PROCEDURE:  The patient was prepped and draped in the low  lithotomy position and was explored through a high transverse incision,  which was carried to the fascia with scalpel and electrocautery for  hemostasis.  We resected the skin bridge between the high skin incision and  her prior low Pfannenstiel incision using sharp dissection with  electrocautery for hemostasis.  The anterior rectus fascia was incised, to  the lateral margin of the rectus muscles and curved cephalad bilaterally.  The bellies of the rectus muscle were separated, peritoneum and posterior  sheath elevated and entered with a scalpel.  Adhesions between small  intestine and anterior abdominal wall were lysed using sharp and blunt  dissection.  A large pedicle of dependent omentum was densely adherent to  the anterior abdominal wall in the pelvis, bladder and vaginal cuff.  To  gain operative exposure, the omentum was divided with electrocautery.  After  meticulous sharp dissection, all the small bowel was freed out of  pelvis and  a Bookwalter retractor was placed.  The residual omentum was sharply  dissected off of the bladder flap and vaginal cuff, using electrocautery for  hemostasis.  The sigmoid colon was found to be densely adherent to the high  left pelvic sidewall, up to the region of the left round ligament stump and  to the vaginal cuff.  These adhesions were taken down with sharp and blunt  dissection.  The retroperitoneal dissection continued medially after  identifying the left psoas muscle, and as the retroperitoneal reactive  fibrosis was freed from the sidewall vessels, a cystic mass was found to  arise from the distal left infundibulopelvic ligament.  This was smooth and  multi-loculated, being densely adherent to the  left pelvic sidewall and the  mesentery of the sigmoid colon.  The proximal pelvic ureter was identified  and mobilized out of reactive fibrosis, away from the infundibulopelvic  ligament, which was isolated retroperitoneally, crossclamped, divided and  ligated with a free tie and suture ligature of 2-0 Vicryl. The pararectal  space and paravesical space on the left side were developed with sharp and  blunt dissection, working through dense reactive pelvic fibrosis.  The left  ureter was freed from its attachments medially to the broad ligament, where  it was intimately applied to the lateral surface of the cystic pelvic mass.  Using sharp and blunt dissection with electrocautery for hemostasis, the  cystic mass was dissected from the adhesions that were attaching it to the  mesentery of the sigmoid colon, and the colon was likewise freed from its  adhesive attachments to the left round ligament remnant and the bladder  flap.  Finally, the mass was found to be adherent to the bladder flap and  vaginal cuff, and the obliterated left uterine vascular pedicle.  Using  sharp and blunt dissection, with electrocautery for hemostasis, the cyst  capsule was dissected off of the serosa of the bladder.  This resulted in a  roughened muscularis, which oozed throughout the remainder of the case.  We  finally triangulated the attachments of the cystic mass to the obliterated  uterine vascular pedicle, which was divided with electrocautery.  There was  residual oozing and bleeding from the bladder flap and the uterine vascular  pedicle.  The left ureter was visualized and mobilized laterally, and  hemostasis was achieved with interrupted figure-of-eight sutures and locked  running sutures of 2-0 Vicryl along with judicious electrocautery.  It  should be noted that the cyst was entered during dissection, but all visible  capsular material was excised.  The pelvis was copiously irrigated, final structures  noted to be intact and additional hemostasis achieved where  necessary with electrocautery.  All packs and retractors were removed and  the patient's abdomen was closed in layers, irrigating between layers and  using electrocautery for hemostasis.  A 0 Vicryl was used in a running  fashion to reapproximate rectus posterior sheath along with peritoneum and  rectus muscles in the midline.  The rectus fascia was closed with running 0  Vicryl, subcutaneous adipose tissue with interrupted 3-0 Vicryl and skin  with skin clips.  The patient tolerated the procedure well and was returned  to the recovery room in stable condition.   ESTIMATED BLOOD LOSS:  200 mL.   TRANSFUSIONS:  None.   DRAINS AND PACKS, ETC:  Foley to dependent drainage.   COUNTS:  Sponge and instrument counts correct x2.   PATHOLOGY SPECIMEN:  Left pelvic cyst and omental  remnant.  Jonny Ruiz T. Kyla Balzarine, M.D.  Electronically Signed     JTS/MEDQ  D:  11/25/2005  T:  11/26/2005  Job:  [id number]   cc:   Malachi Pro. Ambrose Mantle, M.D.  Fax: [phone]   Telford Nab, R.N.  [address]  [place], [place] [address]

## 2010-11-13 NOTE — Consult Note (Signed)
NAME:  [name], [name]                ACCOUNT NO.:  [id number]   MEDICAL RECORD NO.:  [id number]          PATIENT TYPE:  OUT   LOCATION:  GYN                          FACILITY:  [facility]   PHYSICIAN:  Paola A. Duard Brady, MD    DATE OF BIRTH:  March 18, 1961   DATE OF CONSULTATION:  01/04/2006  DATE OF DISCHARGE:                                   CONSULTATION   [name] is a 50 year old patient with a long history of chronic pelvic  pain who underwent an abdominal hysterectomy approximately 6 months ago.  She has persistent symptomatic left adnexal mass and was evaluated by Dr.  Kyla Mcdonald.  On Nov 23, 2005, she went to the operating room and underwent  exploratory laparotomy, lysis of adhesions, resection of adherent omentum,  left ureterolysis, excision of what was felt to be a left pelvic ovarian  remnant. Final pathology was consistent with the omentum showing  fibroadipose tissue was endometriosis and an adherent portion of fallopian  tube.  Within the left pelvis, there was residual ovarian tissue with  inclusion cyst and extensive adhesions.  She comes in today for her  postoperative check.  She was seen on June 4 for staple removal.  At that  time she was complaining of soreness. She was eating okay.  She has not had  a bowel movement but had not taken a laxative at that time, and she comes in  today for followup. Since her last visit, she has lost approximately 11  pounds.  She states last week she had progressively increasing abdominal  pain which started last week. She did not have any pain medications at home.  She had been taking Tylenol without any benefit. She has complained of  nausea and vomiting about three times yesterday and typically only about  once a day.  She is tolerating only to soup, and that often times causes her  to throw up.  She is complaining of constipation.  She believes that she is  passing flatus but is not sure. She, as stated above, has lost about 11  pounds  since she was last seen. She states she has been taking her  temperatures at home, and they range from 100-105.  She is voiding well,  though she complains of dysuria.   PHYSICAL EXAMINATION:  VITAL SIGNS: Weight 139 pounds, blood pressure 90/64.  GENERAL:  Well-nourished, well-developed female in no acute distress.  ABDOMEN:  She has a well-healed transverse skin incision.  Abdomen is  diffusely tender.  There is no rebound or guarding. Abdominal pain does  appear to be somewhat distractible.  BIMANUAL EXAMINATION: She does have  vaginal cuff discomfort which does not appear to be as distractible as or  abdominal discomfort.  At the top of the vagina on her left side, there  appears to be an oblong 3-4 cm mass which is tender to palpation.   ASSESSMENT:  A 50 year old status post exploratory laparotomy and extensive  lysis of adhesions for left-sided ovarian remnant and pelvic pain.  I have  not examined her, but it does appear to me  that there is a 3-4 cm fullness  at the top of the vaginal cuff. Rectovaginal confirmed, and this is not  stool. We will have her get a transvaginal  ultrasound today for evaluation.  Will also check a urinalysis as she does  complain of dysuria.  She will return to clinic after these studies have  been done today so a disposition can be determined.  An addendum will be  dictated at that time.      Paola A. Duard Brady, MD  Electronically Signed     PAG/MEDQ  D:  01/04/2006  T:  01/04/2006  Job:  [id number]   cc:   Telford Nab, R.N.  [address]  [place], [place] [address]   Malachi Pro. Ambrose Mantle, M.D.  Fax: [phone]

## 2010-11-13 NOTE — Consult Note (Signed)
NAME:  [name], [name]                ACCOUNT NO.:  [id number]   MEDICAL RECORD NO.:  [id number]          PATIENT TYPE:  OUT   LOCATION:  GYN                          [facility]ACILITY[facility]   PHYSICIAN:  Paola A. Duard Brady, MD    DATE OF BIRTH:  27-Jul-1960   DATE OF CONSULTATION:  01/04/2006  DATE OF DISCHARGE:                                   CONSULTATION   ADDENDUM:  On examination, she had a 3-4 cm induration versus a mass on the  left side of the vaginal cuff.  She did undergo a transvaginal ultrasound  which was negative.  There was no fluid collection or masses noted.  Urinalysis is still pending.  The patient was given a prescription for  Percocet #60 of 5/325 one to two q.4-6h. p.r.n. pain.  Will follow up on the  results for urinalysis tomorrow and treat accordingly.      Paola A. Duard Brady, MD  Electronically Signed     PAG/MEDQ  D:  01/04/2006  T:  01/04/2006  Job:  [id number]   cc:   Malachi Pro. Ambrose Mantle, M.D.  Fax: [phone]   Telford Nab, R.N.  [address]  [place], [place] [address]

## 2010-11-13 NOTE — Consult Note (Signed)
NAME:  [name], [name]                ACCOUNT NO.:  [id number]   MEDICAL RECORD NO.:  [id number]          PATIENT TYPE:  OUT   LOCATION:  GYN                          FACILITY:  [facility]   PHYSICIAN:  John T. Kyla Balzarine, M.D.    DATE OF BIRTH:  Mar 24, 1961   DATE OF CONSULTATION:  11/09/2005  DATE OF DISCHARGE:                                   CONSULTATION   CHIEF COMPLAINT:  Left ovarian remnant.   HISTORY OF PRESENT ILLNESS:  The patient has longstanding pelvic pain and  dysmenorrhea, being treated by Dr. Ambrose Mantle with total abdominal hysterectomy  and BSO in October 2006.  Final pathology revealed two tubes and ovaries.  Surgery was apparently complicated by extensive adhesive disease  and she  developed a postoperative pneumonia.  The patient notes aching left lower  quadrant pain which has persisted almost unabated since surgery.  In  February, CT scan of abdomen and pelvis and pelvic ultrasound revealed a  cyst of the left adnexal region without free fluid, measuring approximately  5 cm.  On ultrasound this was characterized as having several thin  septations within the lumen with no color flow.  A smaller fluid level was  seen in the right adnexal region and appeared avascular.  The patient did  not respond to high-dose Megace and did not wish Lupron.  She describes her  pain is varying between 4 and 6 and increasing in intensity during the day,  without radiation to the back or down her leg.  She has noted increasing  urinary frequency.  She has been using largely Tylenol for analgesics.   PAST MEDICAL HISTORY:  1.  Migraine headache.  2.  Borderline asthma with a environmental allergies.  3.  Postoperative pneumonia as above.  4.  The patient apparently had a remote history of endometriosis but no      definitive histologic evidence of endometriosis at the time of her last      surgery.   CURRENT MEDICATIONS:  Tizanidine p.r.n. migraine headaches, ibuprofen,  Advair.  She uses a  humidifier at home, [employer], and is under the  care of Dr. Jetty Duhamel for her pulmonary disease.   ALLERGIES:  Anaphylaxis apparently with COMPAZINE, REGLAN, PHENOTHIAZINES  and ERYTHROMYCIN.   PERSONAL AND SOCIAL HISTORY:  The patient is single, not sexually active,  and denies tobacco or significant ethanol use.   FAMILY HISTORY:  Father with prostate cancer and multiple family members  with adult onset diabetes but no known breast, gynecological or colon  malignancies.   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  She notes urinary urgency and frequency without  dysuria or incontinence.  She denies change in bowel function.  Otherwise,  comprehensive review of systems negative in 10 systems.   PHYSICAL EXAMINATION:  VITAL SIGNS:  Weight 150 pounds, blood pressure  118/80, pulse 68 , respirations 18.  GENERAL:  The patient is alert and oriented x3, in no acute distress.  ENT:  Benign with clear oropharynx.  There is  no scleral icterus.  Extraocular movements are normal.  NECK:  Supple without goiter.  LUNGS:  Lung fields clear with no active wheezing.  HEART:  Regular rate rhythm without JVD.  LYMPHATIC SURVEY:  Negative for pathologic lymphadenopathy.  BACK:  There is no back or CVA tenderness.  ABDOMEN:  Soft and benign with no ascites, mass or organomegaly.  There is  no tenderness, and a prior Pfannenstiel incision is well-healed without  hernia.  EXTREMITIES:  Full strength and range of motion without cyanosis, clubbing  or edema.  Peripheral pulses intact.  NEUROLOGIC:  Neurologic screen intact.  SKIN:  No suspicious lesions.  PELVIC:  External genitalia and BUS normal to inspection and palpation.  Bladder and urethra are normal.  The vagina is clear with no mucosal  lesions.  Bimanual and rectovaginal examinations reveal tender fullness in  the left posterior pelvis adjacent to the cuff, less than 6  cm.   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   PLAN:  The patient is scheduled for exploration in the near future.  We will  examine her under anesthesia and make a decision regarding exploration  through Pfannenstiel or midline incision.  We would search for any residual  remnant of the right ovary as well.  The etiology of ovarian remnant was  discussed with the patient, and I answered multiple questions.  We will  perform surgery jointly with Dr. Ambrose Mantle and be available for ureterolysis  and radical resection of the pelvic sidewall, which are often needed in  these surgeries.      John T. Kyla Balzarine, M.D.  Electronically Signed     JTS/MEDQ  D:  11/09/2005  T:  11/10/2005  Job:  [id number]   cc:   Joni Fears D. Maple Hudson, M.D.  [facility]  [address]  [place]  [place] [address]   Malachi Pro. Ambrose Mantle, M.D.  Fax: [phone]   Telford Nab, R.N.  [address]  [place], [place] [address]

## 2011-03-31 LAB — COMPREHENSIVE METABOLIC PANEL
ALT: 22
ALT: <8
AST: 39 — ABNORMAL HIGH
AST: 44 — ABNORMAL HIGH
Albumin: 3.7
Albumin: 4
Albumin: 4.4
Alkaline Phosphatase: 62
CO2: 22
Calcium: 8.9
Calcium: 9.6
Chloride: 109
Chloride: 111
Creatinine, Ser: 1.06
GFR calc Af Amer: >60
GFR calc Af Amer: >60
GFR calc non Af Amer: 55 — ABNORMAL LOW
Glucose, Bld: 243 — ABNORMAL HIGH
Potassium: 2.9 — ABNORMAL LOW
Sodium: 141
Sodium: 142
Total Bilirubin: 0.8
Total Protein: 7.7

## 2011-03-31 LAB — POCT I-STAT, CHEM 8
Calcium, Ion: 1.16
Creatinine, Ser: 1.1
Glucose, Bld: 148 — ABNORMAL HIGH
HCT: 38
Hemoglobin: 12.9
Potassium: 3 — ABNORMAL LOW
TCO2: 18

## 2011-03-31 LAB — CULTURE, BLOOD (ROUTINE X 2): Culture: NO GROWTH

## 2011-03-31 LAB — URINALYSIS, ROUTINE W REFLEX MICROSCOPIC
Bilirubin Urine: NEGATIVE
Nitrite: NEGATIVE
Specific Gravity, Urine: 1.011
Urobilinogen, UA: 0.2

## 2011-03-31 LAB — GLUCOSE, CAPILLARY
Glucose-Capillary: 103 — ABNORMAL HIGH
Glucose-Capillary: 122 — ABNORMAL HIGH
Glucose-Capillary: 126 — ABNORMAL HIGH
Glucose-Capillary: 129 — ABNORMAL HIGH
Glucose-Capillary: 146 — ABNORMAL HIGH

## 2011-03-31 LAB — POCT I-STAT 3, ART BLOOD GAS (G3+)
Acid-base deficit: 7 — ABNORMAL HIGH
O2 Saturation: 98
Patient temperature: 98.8
pO2, Arterial: 113 — ABNORMAL HIGH

## 2011-03-31 LAB — DIFFERENTIAL
Eosinophils Absolute: 0
Lymphocytes Relative: 8 — ABNORMAL LOW
Monocytes Absolute: 0.1
Monocytes Relative: 1 — ABNORMAL LOW
Neutro Abs: 10.6 — ABNORMAL HIGH
Neutrophils Relative %: 91 — ABNORMAL HIGH

## 2011-03-31 LAB — CARDIAC PANEL(CRET KIN+CKTOT+MB+TROPI)
CK, MB: 13.5 — ABNORMAL HIGH
CK, MB: 13.5 — ABNORMAL HIGH
Relative Index: 2
Total CK: 803 — ABNORMAL HIGH
Total CK: 869 — ABNORMAL HIGH
Troponin I: 0.01
Troponin I: <0.01

## 2011-03-31 LAB — CBC
MCHC: 32.7
MCHC: 34.3
MCHC: 34.4
MCV: 86.9
Platelets: 170
Platelets: 172
Platelets: 177
Platelets: 210
RBC: 3.83 — ABNORMAL LOW
RBC: 4.11
RDW: 13.8
RDW: 14
WBC: 21.7 — ABNORMAL HIGH

## 2011-03-31 LAB — BLOOD GAS, ARTERIAL
Bicarbonate: 17.1 — ABNORMAL LOW
O2 Saturation: 99
pO2, Arterial: 154 — ABNORMAL HIGH

## 2011-03-31 LAB — LACTIC ACID, PLASMA
Lactic Acid, Venous: 10.4 — ABNORMAL HIGH
Lactic Acid, Venous: 3.1 — ABNORMAL HIGH

## 2011-03-31 LAB — BASIC METABOLIC PANEL
BUN: 11
CO2: 17 — ABNORMAL LOW
CO2: 20
Calcium: 8.7
Chloride: 110
Creatinine, Ser: 0.93
GFR calc non Af Amer: >60
Glucose, Bld: 106 — ABNORMAL HIGH
Glucose, Bld: 207 — ABNORMAL HIGH
Potassium: 3.7
Sodium: 141
Sodium: 142

## 2011-03-31 LAB — URINE MICROSCOPIC-ADD ON

## 2011-03-31 LAB — RAPID URINE DRUG SCREEN, HOSP PERFORMED
Barbiturates: NOT DETECTED
Benzodiazepines: NOT DETECTED

## 2011-03-31 LAB — URINE CULTURE

## 2011-03-31 LAB — ETHANOL: Alcohol, Ethyl (B): <5

## 2011-03-31 LAB — KETONES, QUALITATIVE: Acetone, Bld: NEGATIVE

## 2012-07-13 ENCOUNTER — Encounter: Payer: Self-pay | Admitting: Internal Medicine

## 2012-07-13 ENCOUNTER — Ambulatory Visit (INDEPENDENT_AMBULATORY_CARE_PROVIDER_SITE_OTHER): Payer: PRIVATE HEALTH INSURANCE | Admitting: Internal Medicine

## 2012-07-13 ENCOUNTER — Telehealth: Payer: Self-pay | Admitting: Internal Medicine

## 2012-07-13 ENCOUNTER — Encounter: Payer: Self-pay | Admitting: *Deleted

## 2012-07-13 VITALS — BP 130/76 | HR 94 | Temp 96.8°F | Ht 65.0 in | Wt 197.8 lb

## 2012-07-13 DIAGNOSIS — J45909 Unspecified asthma, uncomplicated: Secondary | ICD-10-CM

## 2012-07-13 MED ORDER — HYDROCOD POLST-CHLORPHEN POLST 10-8 MG/5ML PO LQCR: 5.0000 mL | Freq: Two times a day (BID) | ORAL | Status: DC | PRN

## 2012-07-13 MED ORDER — PREDNISONE 10 MG PO TABS: ORAL_TABLET | ORAL | Status: DC

## 2012-07-13 NOTE — Progress Notes (Signed)
Pt last seen 11-2009. Pt c/o cough, hoarseness and is requesting an appt today. Per CY ok to add on at 4:15. Carron Curie, CMA  07/13/12- 51 yoF never smoker with hx recurrent intervals of chronic cough ACUTE VISIT: last seen 2011; fevers,cough-non productive, chest tightness, wheezing, SOB, slight bit of a voice. For 2 weeks has been hoarse with dry cough chest tightness and wheeze, shortness of breath. No recognized fever or sore throat, purulent sputum sinus drainage or reflux. She is using her nebulizer with albuterol. She also has rescue inhaler. I have seen her several times over the years at long intervals for sustained episodes of bronchitic cough but usually seem to be a viral triggered tracheobronchitis but slow to clear.  ROS-see HPI Constitutional:   No-   weight loss, night sweats, fevers, chills, fatigue, lassitude. HEENT:   No-  headaches, difficulty swallowing, tooth/dental problems, sore throat,       No-  sneezing, itching, ear ache, nasal congestion, post nasal drip,  CV:  No-   chest pain, orthopnea, PND, swelling in lower extremities, anasarca,                                  dizziness, palpitations Resp: No-   shortness of breath with exertion or at rest.              No-   productive cough,  +non-productive cough,  No- coughing up of blood.              No-   change in color of mucus.  No- wheezing.   Skin: No-   rash or lesions. GI:  No-   heartburn, indigestion, abdominal pain, nausea, vomiting, GU:  MS:  No-   joint pain or swelling. . Neuro-     nothing unusual Psych:  No- change in mood or affect. No depression or anxiety.  No memory loss.  OBJ- Physical Exam General- Alert, Oriented, Affect-appropriate, Distress- none acute Skin- rash-none, lesions- none, excoriation- none Lymphadenopathy- none Head- atraumatic            Eyes- Gross vision intact, PERRLA, conjunctivae and secretions clear            Ears- Hearing, canals-normal            Nose- Clear,  no-Septal dev, mucus, polyps, erosion, perforation             Throat- Mallampati II , mucosa clear , drainage- none, tonsils- atrophic. Hoarse  Neck- flexible , trachea midline, no stridor , thyroid nl, carotid no bruit Chest - symmetrical excursion , unlabored           Heart/CV- RRR , no murmur , no gallop  , no rub, nl s1 s2                           - JVD- none , edema- none, stasis changes- none, varices- none           Lung- clear to P&A, wheeze- none, + dry cough , dullness-none, rub- none           Chest wall-  Abd-  Br/ Gen/ Rectal- Not done, not indicated Extrem- cyanosis- none, clubbing, none, atrophy- none, strength- nl Neuro- grossly intact to observation

## 2012-07-13 NOTE — Telephone Encounter (Signed)
Pt last seen 11-2009. Pt c/o cough, hoarseness and is requesting an appt today. Per CY ok to add on at 4:15. Carron Curie, CMA

## 2012-07-13 NOTE — Patient Instructions (Addendum)
Scripts for tussionex cough syrup and for prednisone taper  Flu vax  Throat lozenges, sips of liquids, voice rest  Please call as needed

## 2012-07-18 ENCOUNTER — Telehealth: Payer: Self-pay | Admitting: Internal Medicine

## 2012-07-18 ENCOUNTER — Encounter: Payer: Self-pay | Admitting: *Deleted

## 2012-07-18 NOTE — Telephone Encounter (Signed)
Spoke with pt.  She would like to be out of work the remainder of this week and return on Monday, Jul 24, 2012.  Advised letter completed and placed at front for pick up.  She verbalized understanding and voiced no further questions or concerns at this time.

## 2012-07-18 NOTE — Telephone Encounter (Signed)
Per CY- okay to give note

## 2012-07-18 NOTE — Telephone Encounter (Signed)
Spoke with pt.  She was seen on 07/13/12 by Dr. Maple Hudson.  States she is still having a nonprod cough and can barely talk d/t no voice.  States she takes tussionex when at home with relief but cannot take when at work.  She is currently at work and requesting letter to leave work early today and be off the next couple of days d/t her symptoms.  She would like to pick letter up if ok on her way home today.  Dr. Maple Hudson, pls advise if this is ok.  Thank you.

## 2012-07-18 NOTE — Telephone Encounter (Signed)
lmomtcb x1 

## 2012-07-18 NOTE — Telephone Encounter (Signed)
Per Florentina Addison, we can find out when pt wants to return to work and write letter out for this day.  Letter can be written through the end of the week  lmomtcb for pt

## 2012-07-25 NOTE — Assessment & Plan Note (Signed)
Nonspecific post viral pattern. Aggressive workup in the past but never demonstrated significant reflux. Plan-Tussionex, prednisone taper, flu vaccine, fluids and voice rest

## 2012-08-01 ENCOUNTER — Telehealth: Payer: Self-pay | Admitting: Internal Medicine

## 2012-08-01 DIAGNOSIS — J040 Acute laryngitis: Secondary | ICD-10-CM

## 2012-08-01 NOTE — Telephone Encounter (Signed)
Called spoke with patient, advised of CY's recs as stated below.  Pt okay with these recommendations and verbalized her understanding.  Order placed for ASAP appt with [facility].  Pt stated she prefers a late appt but is aware that a work-in ov may not be available at that time.  Pt aware to call back if her symptoms worsen or she does not hear back about the ENT ov.  ENT referral placed.

## 2012-08-01 NOTE — Telephone Encounter (Signed)
I spoke with pt. C/o dry cough, wheezing, chest tx, throat feels tx, pt very hoarse (can barely talk), still running fever (unsure how high but feels hot), SOB is worse. She finished prednisone, she tried resting her voice, used lozenges, and the tussionex cough syrup. Pt is requesting further recs. Please advise Dr. Maple Hudson thanks Last OV 07/13/12 Pending 07/13/13 Allergies  Allergen Reactions  . Doxycycline   . Metoclopramide Hcl   . Prochlorperazine Edisylate

## 2012-08-01 NOTE — Telephone Encounter (Signed)
See if we can get her an early appointment with an ENT at [facility], With Dr Haroldine Laws, or with Dr Ezzard Standing for dx severe laryngitis.

## 2013-03-27 ENCOUNTER — Ambulatory Visit (INDEPENDENT_AMBULATORY_CARE_PROVIDER_SITE_OTHER): Payer: PRIVATE HEALTH INSURANCE | Admitting: Internal Medicine

## 2013-03-27 ENCOUNTER — Encounter: Payer: Self-pay | Admitting: Internal Medicine

## 2013-03-27 ENCOUNTER — Telehealth: Payer: Self-pay | Admitting: Internal Medicine

## 2013-03-27 ENCOUNTER — Encounter: Payer: Self-pay | Admitting: *Deleted

## 2013-03-27 VITALS — BP 108/64 | HR 73 | Temp 98.0°F | Ht 65.0 in | Wt 193.0 lb

## 2013-03-27 DIAGNOSIS — J4531 Mild persistent asthma with (acute) exacerbation: Secondary | ICD-10-CM

## 2013-03-27 DIAGNOSIS — J45901 Unspecified asthma with (acute) exacerbation: Secondary | ICD-10-CM

## 2013-03-27 MED ORDER — AZITHROMYCIN 250 MG PO TABS: ORAL_TABLET | ORAL | Status: DC

## 2013-03-27 MED ORDER — HYDROCOD POLST-CHLORPHEN POLST 10-8 MG/5ML PO LQCR: 5.0000 mL | Freq: Two times a day (BID) | ORAL | Status: DC | PRN

## 2013-03-27 MED ORDER — PREDNISONE 10 MG PO TABS: ORAL_TABLET | ORAL | Status: DC

## 2013-03-27 NOTE — Patient Instructions (Signed)
Scripts for cough syrup and prednisone taper, and for antibitotic to use if needed  Get plenty of fluids, heating pad and stretching for your back

## 2013-03-27 NOTE — Progress Notes (Signed)
Pt last seen 11-2009. Pt c/o cough, hoarseness and is requesting an appt today. Per CY ok to add on at 4:15. Carron Curie, CMA  07/13/12- 51 yoF never smoker with hx recurrent intervals of chronic cough ACUTE VISIT: last seen 2011; fevers,cough-non productive, chest tightness, wheezing, SOB, slight bit of a voice. For 2 weeks has been hoarse with dry cough chest tightness and wheeze, shortness of breath. No recognized fever or sore throat, purulent sputum sinus drainage or reflux. She is using her nebulizer with albuterol. She also has rescue inhaler. I have seen her several times over the years at long intervals for sustained episodes of bronchitic cough but usually seem to be a viral triggered tracheobronchitis but slow to clear.  03/27/13- 52 yoF never smoker with hx recurrent intervals of chronic cough Injured back 2-3 wks. ago,out of work x 1 wk.,sob with exertion x4days,cough-dry,wheezing,chest pain & tightness mid chest,temp yest 103 Acute illness began yesterday with fever and cough but no fever today. Cough is dry. A coworker had been sick with similar illness. Now has sore neck and headache from coughing.  ROS-see HPI Constitutional:   No-   weight loss, night sweats, +fevers, chills, fatigue, lassitude. HEENT:   +  headaches, difficulty swallowing, tooth/dental problems, sore throat,       No-  sneezing, itching, ear ache, nasal congestion, post nasal drip,  CV: + chest pain, orthopnea, PND, swelling in lower extremities, anasarca, dizziness, palpitations Resp: No-   shortness of breath with exertion or at rest.              No-   productive cough,  +non-productive cough,  No- coughing up of blood.              No-   change in color of mucus.  + wheezing.   Skin: No-   rash or lesions. GI:  No-   heartburn, indigestion, abdominal pain, nausea, vomiting, GU:  MS:  No-   joint pain or swelling. . Neuro-     nothing unusual Psych:  No- change in mood or affect. No depression or  anxiety.  No memory loss.  OBJ- Physical Exam General- Alert, Oriented, Affect-appropriate, Distress + diaphoretic. + standing due to back pain Skin- rash-none, lesions- none, excoriation- none Lymphadenopathy- none Head- atraumatic            Eyes- Gross vision intact, PERRLA, conjunctivae and secretions clear            Ears- Hearing, canals-normal            Nose- Clear, no-Septal dev, mucus, polyps, erosion, perforation             Throat- Mallampati II , mucosa clear , drainage- none, tonsils- atrophic. +Hoarse  Neck- flexible , trachea midline, no stridor , thyroid nl, carotid no bruit Chest - symmetrical excursion , unlabored           Heart/CV- RRR , no murmur , no gallop  , no rub, nl s1 s2                           - JVD- none , edema- none, stasis changes- none, varices- none           Lung- clear to P&A, wheeze- none, + harsh dry cough , dullness-none, rub- none           Chest wall-  Abd-  Br/ Gen/ Rectal- Not done, not indicated  Extrem- cyanosis- none, clubbing, none, atrophy- none, strength- nl Neuro- grossly intact to observation

## 2013-03-27 NOTE — Telephone Encounter (Signed)
[name] spoke with pt who c/o increased SOB, HA, and still neck.   Symptoms started this morning.   O2 sat 99-100% ra.   We have added pt to CDY's schedule for this morning at 9:30 am. Pt aware.

## 2013-04-07 ENCOUNTER — Encounter: Payer: Self-pay | Admitting: Internal Medicine

## 2013-04-07 NOTE — Assessment & Plan Note (Signed)
Acute illness, likely viral. She has had this pattern in the past. Headache and back pain are musculoskeletal, more likely due to cough. Plan-work note for the rest of this week. Prednisone taper, Tussionex, Z-Pak to hold with discussion

## 2013-05-14 ENCOUNTER — Encounter: Payer: Self-pay | Admitting: Internal Medicine

## 2013-05-14 ENCOUNTER — Telehealth: Payer: Self-pay | Admitting: Internal Medicine

## 2013-05-14 MED ORDER — ALBUTEROL SULFATE HFA 108 (90 BASE) MCG/ACT IN AERS: 2.0000 | INHALATION_SPRAY | RESPIRATORY_TRACT | Status: DC | PRN

## 2013-05-14 NOTE — Telephone Encounter (Signed)
We received a message through MyChart for a refill on albuterol inhaler. Do not see in her chart where we have rx'd this for her.   Allergies  Allergen Reactions  . Doxycycline   . Metoclopramide Hcl   . Prochlorperazine Edisylate    Current Outpatient Prescriptions on File Prior to Visit  Medication Sig Dispense Refill  . azithromycin (ZITHROMAX) 250 MG tablet 2 today then one daily  6 tablet  0  . chlorpheniramine-HYDROcodone (TUSSIONEX PENNKINETIC ER) 10-8 MG/5ML LQCR Take 5 mLs by mouth every 12 (twelve) hours as needed.  200 mL  1  . cyclobenzaprine (FLEXERIL) 10 MG tablet Take 10 mg by mouth 3 (three) times daily as needed for muscle spasms.      Marland Kitchen HYDROcodone-acetaminophen (NORCO) 10-325 MG per tablet Take 1 tablet by mouth every 6 (six) hours as needed for pain.      . predniSONE (DELTASONE) 10 MG tablet 4 X 2 DAYS, 3 X 2 DAYS, 2 X 2 DAYS, 1 X 2 DAYS  20 tablet  0   No current facility-administered medications on file prior to visit.   CY - please advise. Thanks.

## 2013-05-14 NOTE — Telephone Encounter (Signed)
Per CY- Rx Albuterol HFA( which ever her insurance will cover) #1 2 puffs every 4 hours prn with prn refills.

## 2013-07-13 ENCOUNTER — Ambulatory Visit (INDEPENDENT_AMBULATORY_CARE_PROVIDER_SITE_OTHER)
Admission: RE | Admit: 2013-07-13 | Discharge: 2013-07-13 | Disposition: A | Discharge status: Home / Self Care | Payer: PRIVATE HEALTH INSURANCE | Source: Ambulatory Visit | Attending: Internal Medicine | Admitting: Internal Medicine

## 2013-07-13 ENCOUNTER — Encounter: Payer: Self-pay | Admitting: Internal Medicine

## 2013-07-13 ENCOUNTER — Ambulatory Visit (INDEPENDENT_AMBULATORY_CARE_PROVIDER_SITE_OTHER): Payer: PRIVATE HEALTH INSURANCE | Admitting: Internal Medicine

## 2013-07-13 VITALS — BP 114/86 | HR 86 | Ht 65.0 in | Wt 198.0 lb

## 2013-07-13 DIAGNOSIS — J45909 Unspecified asthma, uncomplicated: Secondary | ICD-10-CM

## 2013-07-13 DIAGNOSIS — J209 Acute bronchitis, unspecified: Secondary | ICD-10-CM

## 2013-07-13 MED ORDER — PREDNISONE 10 MG PO TABS: ORAL_TABLET | ORAL | Status: DC

## 2013-07-13 MED ORDER — AZITHROMYCIN 250 MG PO TABS: ORAL_TABLET | ORAL | Status: DC

## 2013-07-13 MED ORDER — HYDROCOD POLST-CHLORPHEN POLST 10-8 MG/5ML PO LQCR: 5.0000 mL | Freq: Two times a day (BID) | ORAL | Status: DC | PRN

## 2013-07-13 MED ORDER — LEVALBUTEROL HCL 0.63 MG/3ML IN NEBU
0.6300 mg | INHALATION_SOLUTION | Freq: Once | RESPIRATORY_TRACT | Status: AC
  Administered 2013-07-13: 0.63 mg via RESPIRATORY_TRACT

## 2013-07-13 MED ORDER — METHYLPREDNISOLONE ACETATE 80 MG/ML IJ SUSP
80.0000 mg | Freq: Once | INTRAMUSCULAR | Status: AC
  Administered 2013-07-13: 80 mg via INTRAMUSCULAR

## 2013-07-13 NOTE — Progress Notes (Signed)
Pt last seen 11-2009. Pt c/o cough, hoarseness and is requesting an appt today. Per CY ok to add on at 4:15. Carron CurieJennifer Castillo, CMA  07/13/12- 51 yoF never smoker with hx recurrent intervals of chronic cough ACUTE VISIT: last seen 2011; fevers,cough-non productive, chest tightness, wheezing, SOB, slight bit of a voice. For 2 weeks has been hoarse with dry cough chest tightness and wheeze, shortness of breath. No recognized fever or sore throat, purulent sputum sinus drainage or reflux. She is using her nebulizer with albuterol. She also has rescue inhaler. I have seen her several times over the years at long intervals for sustained episodes of bronchitic cough but usually seem to be a viral triggered tracheobronchitis but slow to clear.  03/27/13- 52 yoF never smoker with hx recurrent intervals of chronic cough Injured back 2-3 wks. ago,out of work x 1 wk.,sob with exertion x4days,cough-dry,wheezing,chest pain & tightness mid chest,temp yest 103 Acute illness began yesterday with fever and cough but no fever today. Cough is dry. A coworker had been sick with similar illness. Now has sore neck and headache from coughing.  07/13/13- 52 yoF never smoker with hx recurrent intervals of chronic cough FOLLOWS FOR: Breathing has gotten worse since last OV. Reports severe SOB, coughing, chest tightness and wheezing. Cough had cleared in September but flared again 2 or 3 weeks ago after a distinct viral illness with fever. She is again left with her chronic persistent dry bronchitic cough which is exhausting and disrupted. She may recognize a little reflux but no significant postnasal drip or heartburn.  ROS-see HPI Constitutional:   No-   weight loss, night sweats, +fevers, chills, fatigue, lassitude. HEENT:   +  headaches, difficulty swallowing, tooth/dental problems, sore throat,       No-  sneezing, itching, ear ache, nasal congestion, post nasal drip,  CV: + chest pain, orthopnea, PND, swelling in lower  extremities, anasarca, dizziness, palpitations Resp: No-   shortness of breath with exertion or at rest.              No-   productive cough,  +non-productive cough,  No- coughing up of blood.              No-   change in color of mucus.  + wheezing.   Skin: No-   rash or lesions. GI:  No-   heartburn, indigestion, abdominal pain, nausea, vomiting, GU:  MS:  No-   joint pain or swelling. . Neuro-     nothing unusual Psych:  No- change in mood or affect. No depression or anxiety.  No memory loss.  OBJ- Physical Exam General- Alert, Oriented, Affect-appropriate, Distress + diaphoretic. + standing due to back pain Skin- rash-none, lesions- none, excoriation- none Lymphadenopathy- none Head- atraumatic            Eyes- Gross vision intact, PERRLA, conjunctivae and secretions clear            Ears- Hearing, canals-normal            Nose- Clear, no-Septal dev, mucus, polyps, erosion, perforation             Throat- Mallampati II , mucosa clear , drainage- none, tonsils- atrophic. +Hoarse  Neck- flexible , trachea midline, no stridor , thyroid nl, carotid no bruit Chest - symmetrical excursion , unlabored           Heart/CV- RRR , no murmur , no gallop  , no rub, nl s1 s2                           -  JVD- none , edema- none, stasis changes- none, varices- none           Lung- clear to P&A, wheeze- none, + harsh paroxysmal cough , dullness-none, rub- none           Chest wall-  Abd-  Br/ Gen/ Rectal- Not done, not indicated Extrem- cyanosis- none, clubbing, none, atrophy- none, strength- nl Neuro- grossly intact to observation

## 2013-07-13 NOTE — Patient Instructions (Signed)
Order CXR-- dx acute bronchitis  Neb xop 0.63  Depo 80  Scripts printed for prednisone taper, tussionex, Z pak

## 2013-07-31 ENCOUNTER — Ambulatory Visit (INDEPENDENT_AMBULATORY_CARE_PROVIDER_SITE_OTHER): Payer: Worker's Compensation | Admitting: Internal Medicine

## 2013-07-31 ENCOUNTER — Encounter: Payer: Self-pay | Admitting: *Deleted

## 2013-07-31 ENCOUNTER — Encounter: Payer: Self-pay | Admitting: Internal Medicine

## 2013-07-31 VITALS — BP 138/84 | HR 118 | Ht 65.0 in | Wt 197.0 lb

## 2013-07-31 DIAGNOSIS — J45909 Unspecified asthma, uncomplicated: Secondary | ICD-10-CM | POA: Diagnosis not present

## 2013-07-31 MED ORDER — FLUTICASONE FUROATE-VILANTEROL 100-25 MCG/INH IN AEPB
1.0000 | INHALATION_SPRAY | Freq: Once | RESPIRATORY_TRACT | Status: DC
Start: 2013-07-31 — End: 2015-07-03

## 2013-07-31 MED ORDER — LEVALBUTEROL HCL 0.63 MG/3ML IN NEBU
0.6300 mg | INHALATION_SOLUTION | Freq: Once | RESPIRATORY_TRACT | Status: AC
  Administered 2013-07-31: 0.63 mg via RESPIRATORY_TRACT

## 2013-07-31 MED ORDER — PREDNISONE 10 MG PO TABS: ORAL_TABLET | ORAL | Status: DC

## 2013-07-31 MED ORDER — HYDROCOD POLST-CHLORPHEN POLST 10-8 MG/5ML PO LQCR: 5.0000 mL | Freq: Two times a day (BID) | ORAL | Status: DC | PRN

## 2013-07-31 NOTE — Progress Notes (Signed)
Pt last seen 11-2009. Pt c/o cough, hoarseness and is requesting an appt today. Per CY ok to add on at 4:15. Carron Curie, CMA  07/13/12- 51 yoF never smoker with hx recurrent intervals of chronic cough ACUTE VISIT: last seen 2011; fevers,cough-non productive, chest tightness, wheezing, SOB, slight bit of a voice. For 2 weeks has been hoarse with dry cough chest tightness and wheeze, shortness of breath. No recognized fever or sore throat, purulent sputum sinus drainage or reflux. She is using her nebulizer with albuterol. She also has rescue inhaler. I have seen her several times over the years at long intervals for sustained episodes of bronchitic cough but usually seem to be a viral triggered tracheobronchitis but slow to clear.  03/27/13- 52 yoF never smoker with hx recurrent intervals of chronic cough Injured back 2-3 wks. ago,out of work x 1 wk.,sob with exertion x4days,cough-dry,wheezing,chest pain & tightness mid chest,temp yest 103 Acute illness began yesterday with fever and cough but no fever today. Cough is dry. A coworker had been sick with similar illness. Now has sore neck and headache from coughing.  07/13/13- 52 yoF never smoker with hx recurrent intervals of chronic cough FOLLOWS FOR: Breathing has gotten worse since last OV. Reports severe SOB, coughing, chest tightness and wheezing.  07/31/13- 52 yoF never smoker with hx recurrent intervals of chronic cough Was doing better after last visit for her post viral bronchitis with bad cough. 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 She had to leave work that day about 3 PM because of severe cough. 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 Notices mild nausea and maybe some reflux. Using her home nebulizer. Finished the prednisone and Tussionex given last. Still much cough but nonproductive and without overt wheeze. CXR 07/13/13 IMPRESSION:   Limited lateral view. Study is negative.  Electronically Signed  By: Esperanza Heir M.D.  On: 07/13/2013 16:41  ROS-see HPI Constitutional:   No-   weight loss, night sweats, +fevers, chills, fatigue, lassitude. HEENT:   +  headaches, difficulty swallowing, tooth/dental problems, sore throat,       No-  sneezing, itching, ear ache, nasal congestion, post nasal drip,  CV: + chest pain, orthopnea, PND, swelling in lower extremities, anasarca, dizziness, palpitations Resp: No-   shortness of breath with exertion or at rest.              No-   productive cough,  +non-productive cough,  No- coughing up of blood.              No-   change in color of mucus.  + wheezing.   Skin: No-   rash or lesions. GI:  No-   heartburn, indigestion, abdominal pain, nausea, vomiting, GU:  MS:  No-   joint pain or swelling. . Neuro-     nothing unusual Psych:  No- change in mood or affect. No depression or anxiety.  No memory loss.  OBJ- Physical Exam General- Alert, Oriented, Affect-appropriate, Distress + diaphoretic.  Skin- rash-none, lesions- none, excoriation- none Lymphadenopathy- none Head- atraumatic            Eyes- Gross vision intact, PERRLA, conjunctivae and secretions clear            Ears- Hearing, canals-normal            Nose- Clear, no-Septal dev, mucus, polyps, erosion, perforation  Throat- Mallampati II , mucosa clear , drainage- none, tonsils- atrophic. +Hoarse  Neck- flexible , trachea midline, no stridor , thyroid nl, carotid no bruit Chest - symmetrical excursion , unlabored           Heart/CV- RRR , no murmur , no gallop  , no rub, nl s1 s2                           - JVD- none , edema- none, stasis changes- none, varices- none           Lung- clear to P&A, wheeze- none, + harsh dry cough , dullness-none, rub- none           Chest wall-  Abd-  Br/ Gen/ Rectal- Not done, not indicated Extrem- cyanosis- none, clubbing, none, atrophy- none, strength- nl Neuro- grossly  intact to observation

## 2013-07-31 NOTE — Patient Instructions (Signed)
Neb xop 0.63  Sample x 2- Breo Ellipta inhaler  1 puff then rinse mouth, once daily  Scripts for tussionex and prednisone taper  Try sips of Maalox or Mylanta to calm your stomach and keep the acid down.

## 2013-08-07 ENCOUNTER — Telehealth: Payer: Self-pay | Admitting: Internal Medicine

## 2013-08-07 DIAGNOSIS — R042 Hemoptysis: Secondary | ICD-10-CM

## 2013-08-07 NOTE — Telephone Encounter (Signed)
Pt aware of recs. Orders placed. She already has tussionex so does not need rx. Nothing further needed

## 2013-08-07 NOTE — Telephone Encounter (Signed)
Pt saw CDY 07/31/13: Pt c/o prod cough at times coughs up blood. About 3-4 times daily about dime size amount, dark red blood, wheezing, chest tx, SOB. Please advise Dr. Maple HudsonYoung thank  Allergies  Allergen Reactions  . Doxycycline   . Metoclopramide Hcl   . Prochlorperazine Edisylate     Current Outpatient Prescriptions on File Prior to Visit  Medication Sig Dispense Refill  . albuterol (PROVENTIL HFA;VENTOLIN HFA) 108 (90 BASE) MCG/ACT inhaler Inhale 2 puffs into the lungs every 4 (four) hours as needed for wheezing or shortness of breath.  1 Inhaler  6  . chlorpheniramine-HYDROcodone (TUSSIONEX PENNKINETIC ER) 10-8 MG/5ML LQCR Take 5 mLs by mouth every 12 (twelve) hours as needed.  200 mL  0  . Fluticasone Furoate-Vilanterol (BREO ELLIPTA) 100-25 MCG/INH AEPB Inhale 1 puff into the lungs once.  1 each  0  . predniSONE (DELTASONE) 10 MG tablet 4 X 2 DAYS, 3 X 2 DAYS, 2 X 2 DAYS, 1 X 2 DAYS  20 tablet  0   No current facility-administered medications on file prior to visit.

## 2013-08-07 NOTE — Telephone Encounter (Signed)
Order- CT chest with contrast dx hemoptysis  Order- CBC w/ diff, PT/INR, PTT  Ok to refill tussionex if she needs.

## 2013-08-08 ENCOUNTER — Encounter: Payer: Self-pay | Admitting: Internal Medicine

## 2013-08-08 ENCOUNTER — Ambulatory Visit (INDEPENDENT_AMBULATORY_CARE_PROVIDER_SITE_OTHER)
Admission: RE | Admit: 2013-08-08 | Discharge: 2013-08-08 | Disposition: A | Discharge status: Home / Self Care | Payer: Worker's Compensation | Source: Ambulatory Visit | Attending: Internal Medicine | Admitting: Internal Medicine

## 2013-08-08 ENCOUNTER — Ambulatory Visit (INDEPENDENT_AMBULATORY_CARE_PROVIDER_SITE_OTHER): Payer: Worker's Compensation | Admitting: Internal Medicine

## 2013-08-08 VITALS — BP 150/94 | HR 128 | Ht 65.0 in | Wt 197.0 lb

## 2013-08-08 DIAGNOSIS — J45909 Unspecified asthma, uncomplicated: Secondary | ICD-10-CM | POA: Diagnosis not present

## 2013-08-08 DIAGNOSIS — F411 Generalized anxiety disorder: Secondary | ICD-10-CM

## 2013-08-08 DIAGNOSIS — R042 Hemoptysis: Secondary | ICD-10-CM

## 2013-08-08 DIAGNOSIS — J309 Allergic rhinitis, unspecified: Secondary | ICD-10-CM | POA: Diagnosis not present

## 2013-08-08 DIAGNOSIS — R059 Cough, unspecified: Secondary | ICD-10-CM | POA: Diagnosis not present

## 2013-08-08 DIAGNOSIS — R05 Cough: Secondary | ICD-10-CM

## 2013-08-08 MED ORDER — FORMOTEROL FUMARATE 20 MCG/2ML IN NEBU
20.0000 ug | INHALATION_SOLUTION | Freq: Once | RESPIRATORY_TRACT | Status: AC
  Administered 2013-08-08: 20 ug via RESPIRATORY_TRACT

## 2013-08-08 MED ORDER — IOHEXOL 300 MG/ML  SOLN
80.0000 mL | Freq: Once | INTRAMUSCULAR | Status: AC | PRN
  Administered 2013-08-08: 80 mL via INTRAVENOUS

## 2013-08-08 MED ORDER — METHYLPREDNISOLONE ACETATE 80 MG/ML IJ SUSP
80.0000 mg | Freq: Once | INTRAMUSCULAR | Status: AC
  Administered 2013-08-08: 80 mg via INTRAMUSCULAR

## 2013-08-08 NOTE — Progress Notes (Signed)
Pt last seen 11-2009. Pt c/o cough, hoarseness and is requesting an appt today. Per CY ok to add on at 4:15. [name], CMA  07/13/12- 51 yoF never smoker with hx recurrent intervals of chronic cough ACUTE VISIT: last seen 2011; fevers,cough-non productive, chest tightness, wheezing, SOB, slight bit of a voice. For 2 weeks has been hoarse with dry cough chest tightness and wheeze, shortness of breath. No recognized fever or sore throat, purulent sputum sinus drainage or reflux. She is using her nebulizer with albuterol. She also has rescue inhaler. I have seen her several times over the years at long intervals for sustained episodes of bronchitic cough but usually seem to be a viral triggered tracheobronchitis but slow to clear.  03/27/13- 52 yoF never smoker with hx recurrent intervals of chronic cough Injured back 2-3 wks. ago,out of work x 1 wk.,sob with exertion x4days,cough-dry,wheezing,chest pain & tightness mid chest,temp yest 103 Acute illness began yesterday with fever and cough but no fever today. Cough is dry. A coworker had been sick with similar illness. Now has sore neck and headache from coughing.  07/13/13- 52 yoF never smoker with hx recurrent intervals of chronic cough FOLLOWS FOR: Breathing has gotten worse since last OV. Reports severe SOB, coughing, chest tightness and wheezing.  07/31/13- 52 yoF never smoker with hx recurrent intervals of chronic cough ACUTE VISIT:  Pt walked in -- increased cough and chest pain with chills and sweats.  Has had CT, CXR and labs done today CXR 07/13/13 IMPRESSION:  Limited lateral view. Study is negative.  Electronically Signed  By: Esperanza Heir M.D.  On: 07/13/2013 16:41  08/08/13-07/31/13- 52 yoF never smoker with hx recurrent intervals of chronic cough ACUTE VISIT:  Pt walked in -- increased cough and chest pain with chills and sweats.  Has had CT, CXR and labs done today Just finished prednisone taper which has aggravated her  emotional stress. She is exhausted by continued dry cough. Admits she might recognize a little sense of fluid shifting in mid chest but no overt heartburn or aspiration. Denies postnasal drip. Since last here she had called reporting teaspoon hemoptysis times once or twice. We had ordered chest CT with contrast. CT chest 08/08/13 IMPRESSION:  1. No findings to explain the patient's given symptoms.  2. Triangular-shaped soft tissue in the prevascular space is  presumably representative of thymus, although unexpected for age.  Additionally, it lacks fatty stippling and may be somewhat nodular  superiorly. Consider additional followup CT chest without contrast  in 3-6 months in further evaluation, as clinically indicated, as a  mass cannot be definitively excluded.  Electronically Signed  By: Leanna Battles M.D.  On: 08/08/2013 10:23    ROS-see HPI Constitutional:   No-   weight loss, night sweats, +fevers, chills, fatigue, lassitude. HEENT:   +  headaches, difficulty swallowing, tooth/dental problems, sore throat,       No-  sneezing, itching, ear ache, nasal congestion, post nasal drip,  CV: + chest pain, orthopnea, PND, swelling in lower extremities, anasarca, dizziness, palpitations Resp: + shortness of breath with exertion or at rest.              No-   productive cough,  +non-productive cough,  No- coughing up of blood.              No-   change in color of mucus.  + wheezing.   Skin: No-   rash or lesions. GI:  No-   heartburn, indigestion, abdominal  pain, nausea, vomiting, GU:  MS:  No-   joint pain or swelling. . Neuro-     nothing unusual Psych:  No- change in mood or affect. No depression or anxiety.  No memory loss.  OBJ- Physical Exam General- Alert, Oriented, Affect+ tearful, Distress + diaphoretic, anxious Skin- rash-none, lesions- none, excoriation- none Lymphadenopathy- none Head- atraumatic            Eyes- Gross vision intact, PERRLA, conjunctivae and secretions  clear            Ears- Hearing, canals-normal            Nose- Clear, no-Septal dev, mucus, polyps, erosion, perforation             Throat- Mallampati II , mucosa clear , drainage- none, tonsils- atrophic. +Hoarse  Neck- flexible , trachea midline, no stridor , thyroid nl, carotid no bruit Chest - symmetrical excursion , unlabored           Heart/CV- RRR , no murmur , no gallop  , no rub, nl s1 s2                           - JVD- none , edema- none, stasis changes- none, varices- none           Lung- clear to P&A, wheeze- none, + harsh dry cough , dullness-none, rub- none           Chest wall-  Abd-  Br/ Gen/ Rectal- Not done, not indicated Extrem- cyanosis- none, clubbing, none, atrophy- none, strength- nl Neuro- grossly intact to observation

## 2013-08-08 NOTE — Patient Instructions (Signed)
Neb perforomist \\depo  80  Script for Xanax 0.5 mg, # 30, 1 tid prn  Omeprazole otc twice daily. Reflux precautions.

## 2013-08-08 NOTE — Assessment & Plan Note (Signed)
Situational. This will clear as her cough resolves. Plan-Xanax

## 2013-08-08 NOTE — Assessment & Plan Note (Signed)
Typical pattern for which I have seen her several times over the past years. I think she gets a viral pattern tracheobronchitis but cough is protracted. There may be a reflux component although this is unclear. My review of the chest CT shows air through much of the esophagus, suggesting easier reflux. Anxiety is now significant as we try to wait this process out. Plan Depo-Medrol, neb perforomist, xanax, omeprazole

## 2013-08-11 NOTE — Assessment & Plan Note (Signed)
She has had this pattern several times over the years that I have known her. Based on past experience, it will eventually clear. Plan-nebulizer Xopenex, Depo-Medrol, Tussionex, Z-Pak, prednisone taper, chest x-ray

## 2013-08-13 ENCOUNTER — Encounter: Payer: Self-pay | Admitting: *Deleted

## 2013-08-13 ENCOUNTER — Telehealth: Payer: Self-pay | Admitting: Internal Medicine

## 2013-08-13 MED ORDER — HYDROCOD POLST-CHLORPHEN POLST 10-8 MG/5ML PO LQCR: 5.0000 mL | Freq: Two times a day (BID) | ORAL | Status: DC | PRN

## 2013-08-13 NOTE — Telephone Encounter (Signed)
Spoke with pt and advised that rx for Tussionex was left at front and also letter for out of work extension was left at front for pick up.

## 2013-08-13 NOTE — Telephone Encounter (Signed)
Called and spoke with pt. She is requesting a refill on tussionex. She was asked last week for rx but thought she had enough but does not. Wants to pick this ASAP today. Also she reports she was told if she is not feeling better to call for extended work note. Please advise Dr. Maple HudsonYoung thanks  Allergies  Allergen Reactions  . Doxycycline   . Metoclopramide Hcl   . Prochlorperazine Edisylate

## 2013-08-13 NOTE — Telephone Encounter (Signed)
Ok to refill Tusionex and give letter extending leave of absence as she requests.

## 2013-08-20 ENCOUNTER — Encounter (HOSPITAL_COMMUNITY): Payer: Self-pay | Admitting: Emergency Medicine

## 2013-08-20 ENCOUNTER — Emergency Department (HOSPITAL_COMMUNITY)
Admission: EM | Admit: 2013-08-20 | Discharge: 2013-08-20 | Disposition: A | Discharge status: Home / Self Care | Payer: Worker's Compensation | Attending: Emergency Medicine | Admitting: Emergency Medicine

## 2013-08-20 DIAGNOSIS — J45901 Unspecified asthma with (acute) exacerbation: Secondary | ICD-10-CM | POA: Insufficient documentation

## 2013-08-20 DIAGNOSIS — R209 Unspecified disturbances of skin sensation: Secondary | ICD-10-CM | POA: Diagnosis not present

## 2013-08-20 DIAGNOSIS — R064 Hyperventilation: Secondary | ICD-10-CM | POA: Insufficient documentation

## 2013-08-20 DIAGNOSIS — G4700 Insomnia, unspecified: Secondary | ICD-10-CM | POA: Insufficient documentation

## 2013-08-20 DIAGNOSIS — F411 Generalized anxiety disorder: Secondary | ICD-10-CM | POA: Diagnosis not present

## 2013-08-20 DIAGNOSIS — Z79899 Other long term (current) drug therapy: Secondary | ICD-10-CM | POA: Insufficient documentation

## 2013-08-20 DIAGNOSIS — R0602 Shortness of breath: Secondary | ICD-10-CM | POA: Diagnosis present

## 2013-08-20 DIAGNOSIS — IMO0002 Reserved for concepts with insufficient information to code with codable children: Secondary | ICD-10-CM | POA: Insufficient documentation

## 2013-08-20 DIAGNOSIS — F419 Anxiety disorder, unspecified: Secondary | ICD-10-CM

## 2013-08-20 DIAGNOSIS — R05 Cough: Secondary | ICD-10-CM

## 2013-08-20 DIAGNOSIS — R059 Cough, unspecified: Secondary | ICD-10-CM

## 2013-08-20 MED ORDER — SODIUM CHLORIDE 0.9 % IN NEBU: 3.0000 mL | INHALATION_SOLUTION | RESPIRATORY_TRACT | Status: DC | PRN

## 2013-08-20 MED ORDER — LORAZEPAM 2 MG/ML IJ SOLN
1.0000 mg | INTRAMUSCULAR | Status: DC | PRN
  Administered 2013-08-20: 1 mg via INTRAVENOUS
  Filled 2013-08-20: qty 1

## 2013-08-20 MED ORDER — BENZONATATE 100 MG PO CAPS: 100.0000 mg | ORAL_CAPSULE | Freq: Three times a day (TID) | ORAL | Status: DC

## 2013-08-20 MED ORDER — HYDROCOD POLST-CHLORPHEN POLST 10-8 MG/5ML PO LQCR
5.0000 mL | Freq: Once | ORAL | Status: AC
  Administered 2013-08-20: 5 mL via ORAL
  Filled 2013-08-20: qty 5

## 2013-08-20 MED ORDER — HYDROCODONE-ACETAMINOPHEN 5-325 MG PO TABS
2.0000 | ORAL_TABLET | Freq: Once | ORAL | Status: AC
  Administered 2013-08-20: 2 via ORAL
  Filled 2013-08-20: qty 2

## 2013-08-20 MED ORDER — BENZONATATE 100 MG PO CAPS
200.0000 mg | ORAL_CAPSULE | Freq: Once | ORAL | Status: AC
  Administered 2013-08-20: 200 mg via ORAL
  Filled 2013-08-20: qty 2

## 2013-08-20 MED ORDER — LORAZEPAM 1 MG PO TABS: 1.0000 mg | ORAL_TABLET | Freq: Three times a day (TID) | ORAL | Status: DC | PRN

## 2013-08-20 MED ORDER — SODIUM CHLORIDE 0.9 % IN NEBU
3.0000 mL | INHALATION_SOLUTION | Freq: Three times a day (TID) | RESPIRATORY_TRACT | Status: DC | PRN
  Filled 2013-08-20: qty 3

## 2013-08-20 MED ORDER — HYDROCODONE-ACETAMINOPHEN 5-325 MG PO TABS: 1.0000 | ORAL_TABLET | ORAL | Status: DC | PRN

## 2013-08-20 NOTE — ED Notes (Addendum)
Pt has hx of asthma and bronchitis. Pt states she has been seeing Dr. Maple HudsonYoung for the past 2 weeks due to feeling short of breath. Pt taking shallow breathes but able to answer questions appropriately. Pt oxygen sats at 100%

## 2013-08-20 NOTE — ED Provider Notes (Signed)
CSN: [id number]   [id number]  Arrival date & time 08/20/13  0815 History   First MD Initiated Contact with Patient 08/20/13 [phone]     Chief Complaint  Patient presents with  . Shortness of Breath      HPI  Patient presents with coughing. Per her report and for her recent physician note she gets several episodes of years of bronchitis with persistent cough. Has some obvious elements of anxiety evidence in her presentation today, as well as commented on in her recent chart.  Tried albuterol home. Became worse. Anxiety and numbness in her face and hands. Hyperventilating on arrival. Denies pain, denies fever, feel short of breath  Past Medical History  Diagnosis Date  . Insomnia   . Asthma   . Hyperventilation   . Pleurisy   . Cough    Past Surgical History  Procedure Laterality Date  . Lumbar spine surgery    . Uterine fibroid surgery    . Vesicovaginal fistula closure w/ tah     Family History  Problem Relation Age of Onset  . Prostate cancer Father    History  Substance Use Topics  . Smoking status: Never Smoker   . Smokeless tobacco: Not on file  . Alcohol Use: No   OB History   Grav Para Term Preterm Abortions TAB SAB Ect Mult Living                 Review of Systems  Constitutional: Negative for fever, chills, diaphoresis, appetite change and fatigue.  HENT: Negative for mouth sores, sore throat and trouble swallowing.   Eyes: Negative for visual disturbance.  Respiratory: Positive for cough and shortness of breath. Negative for chest tightness and wheezing.   Cardiovascular: Negative for chest pain.  Gastrointestinal: Negative for nausea, vomiting, abdominal pain, diarrhea and abdominal distention.  Endocrine: Negative for polydipsia, polyphagia and polyuria.  Genitourinary: Negative for dysuria, frequency and hematuria.  Musculoskeletal: Negative for gait problem.  Skin: Negative for color change, pallor and rash.  Neurological: Negative for dizziness, syncope,  light-headedness and headaches.  Hematological: Does not bruise/bleed easily.  Psychiatric/Behavioral: Negative for behavioral problems and confusion.      Allergies  Doxycycline; Metoclopramide hcl; and Prochlorperazine edisylate  Home Medications   Current Outpatient Rx  Name  Route  Sig  Dispense  Refill  . albuterol (PROVENTIL HFA;VENTOLIN HFA) 108 (90 BASE) MCG/ACT inhaler   Inhalation   Inhale 2 puffs into the lungs every 4 (four) hours as needed for wheezing or shortness of breath.   1 Inhaler   6   . albuterol (PROVENTIL) (2.5 MG/3ML) 0.083% nebulizer solution   Nebulization   Take 2.5 mg by nebulization every 4 (four) hours as needed for wheezing or shortness of breath.         . ALPRAZolam (XANAX) 0.5 MG tablet   Oral   Take 0.5 mg by mouth 2 (two) times daily as needed for anxiety.         . chlorpheniramine-HYDROcodone (TUSSIONEX PENNKINETIC ER) 10-8 MG/5ML LQCR   Oral   Take 5 mLs by mouth every 12 (twelve) hours as needed.   200 mL   0   . Diphenhydramine-APAP (PERCOGESIC EXTRA STRENGTH) 12.5-500 MG TABS   Oral   Take 2 tablets by mouth daily.         . Fluticasone Furoate-Vilanterol (BREO ELLIPTA) 100-25 MCG/INH AEPB   Inhalation   Inhale 1 puff into the lungs once.   1 each   0   .  benzonatate (TESSALON) 100 MG capsule   Oral   Take 1 capsule (100 mg total) by mouth every 8 (eight) hours.   21 capsule   0   . HYDROcodone-acetaminophen (NORCO/VICODIN) 5-325 MG per tablet   Oral   Take 1 tablet by mouth every 4 (four) hours as needed.   10 tablet   0   . LORazepam (ATIVAN) 1 MG tablet   Oral   Take 1 tablet (1 mg total) by mouth 3 (three) times daily as needed for anxiety.   15 tablet   0   . sodium chloride 0.9 % nebulizer solution   Nebulization   Take 3 mLs by nebulization as needed (cough).   90 mL   12    BP 142/85  Pulse 60  Temp(Src) 98.5 F (36.9 C) (Axillary)  Resp 18  SpO2 100% Physical Exam  Constitutional: She  is oriented to person, place, and time. She appears well-developed and well-nourished. She appears distressed.  HENT:  Head: Normocephalic.  Eyes: Conjunctivae are normal. Pupils are equal, round, and reactive to light. No scleral icterus.  Neck: Normal range of motion. Neck supple. No thyromegaly present.  Cardiovascular: Normal rate and regular rhythm.  Exam reveals no gallop and no friction rub.   No murmur heard. Pulmonary/Chest: Breath sounds normal. Tachypnea noted. No respiratory distress. She has no decreased breath sounds. She has no wheezes. She has no rhonchi. She has no rales.  Tachypneic and hyperventilating. Clear lungs. No asymmetry.  Abdominal: Soft. Bowel sounds are normal. She exhibits no distension. There is no tenderness. There is no rebound.  Musculoskeletal: Normal range of motion.  Neurological: She is alert and oriented to person, place, and time.  Skin: Skin is warm and dry. No rash noted.  Psychiatric: Her mood appears anxious. Her speech is rapid and/or pressured. She is hyperactive.  Hyperventilating    ED Course  Procedures (including critical care time) Labs Review Labs Reviewed - No data to display Imaging Review No results found.  EKG Interpretation    Date/Time:  Monday August 20 2013 08:24:41 EST Ventricular Rate:  109 PR Interval:  156 QRS Duration: 82 QT Interval:  399 QTC Calculation: 537 R Axis:   23 Text Interpretation:  Sinus tachycardia Prolonged QT interval Baseline wander in lead(s) II III aVF V1 V3 V4 V5 V6 Confirmed by Fayrene Fearing  MD, Nishka Heide ([id number]) on 08/20/2013 8:43:54 AM            MDM   Final diagnoses:  Cough  Anxiety    Improved with anti-tussives, and nebulized saline.  Max benefit with ativan for panic/anxiety.  Plan is continued cough meds, Ativan prn, PCP or Pulmonary f/u.  Pt without wheezing on ariva., clear lungs now.    Rolland Porter, MD 08/20/13 1309

## 2013-08-20 NOTE — ED Notes (Signed)
Pt states she used nebulizer this morning.

## 2013-08-20 NOTE — Therapy (Signed)
Room [employer] A[employer]ol Set-up for humidity. Discuss with Dr. Fayrene FearingJames. Pt not in any distress, Sat is 100%.

## 2013-08-20 NOTE — Discharge Instructions (Signed)
Cough, Adult  A cough is a reflex that helps clear your throat and airways. It can help heal the body or may be a reaction to an irritated airway. A cough may only last 2 or 3 weeks (acute) or may last more than 8 weeks (chronic).  CAUSES Acute cough:  Viral or bacterial infections. Chronic cough:  Infections.  Allergies.  Asthma.  Post-nasal drip.  Smoking.  Heartburn or acid reflux.  Some medicines.  Chronic lung problems (COPD).  Cancer. SYMPTOMS   Cough.  Fever.  Chest pain.  Increased breathing rate.  High-pitched whistling sound when breathing (wheezing).  Colored mucus that you cough up (sputum). TREATMENT   A bacterial cough may be treated with antibiotic medicine.  A viral cough must run its course and will not respond to antibiotics.  Your caregiver may recommend other treatments if you have a chronic cough. HOME CARE INSTRUCTIONS   Only take over-the-counter or prescription medicines for pain, discomfort, or fever as directed by your caregiver. Use cough suppressants only as directed by your caregiver.  Use a cold steam vaporizer or humidifier in your bedroom or home to help loosen secretions.  Sleep in a semi-upright position if your cough is worse at night.  Rest as needed.  Stop smoking if you smoke. SEEK IMMEDIATE MEDICAL CARE IF:   You have pus in your sputum.  Your cough starts to worsen.  You cannot control your cough with suppressants and are losing sleep.  You begin coughing up blood.  You have difficulty breathing.  You develop pain which is getting worse or is uncontrolled with medicine.  You have a fever. MAKE SURE YOU:   Understand these instructions.  Will watch your condition.  Will get help right away if you are not doing well or get worse. Document Released: 12/11/2010 Document Revised: 09/06/2011 Document Reviewed: 12/11/2010 [facility] Patient Information 2014 [employer] [place], [place].  Social Anxiety  Disorder Social anxiety disorder, previously called social phobia, is a mental disorder. People with social anxiety disorder frequently feel nervous, afraid, or embarrassed when around other people in social situations. They constantly worry that other people are judging or criticizing them for how they look, what they say, or how they act. They may worry that other people might reject them because of their appearance or behavior. Social anxiety disorder is more than just occasional shyness or self-consciousness. It can cause severe emotional distress. It can interfere with daily life activities. Social anxiety disorder also may lead to excessive alcohol or drug use and even suicide.  Social anxiety disorder is actually one of the most common mental disorders. It can develop at any time but usually starts in the teenage years. Women are more commonly affected than men. Social anxiety disorder is also more common in people who have family members with anxiety disorders. It also is more common in people who have physical deformities or conditions with characteristics that are obvious to others, such as stuttered speech or movement abnormalities (Parkinson disease).  SYMPTOMS  In addition to feeling anxious or fearful in social situations, people with social anxiety disorder frequently have physical symptoms. Examples include:  Red face (blushing).  Racing heart.  Sweating.  Shaky hands or voice.  Confusion.  Lightheadedness.  Upset stomach and diarrhea. DIAGNOSIS  Social anxiety disorder is diagnosed through an assessment by your caregiver. Your caregiver will ask you questions about your mood, thoughts, and reactions in social situations. Your caregiver may ask you about your medical history and use  of alcohol or drugs, including prescription medications. Certain medical conditions and the use of certain substances, including caffeine, can cause symptoms similar to social anxiety disorder. Your  caregiver may refer you to a mental health specialist for further evaluation or treatment. The criteria for diagnosis of social anxiety disorder are:  Marked fear or anxiety in one or more social situations in which you may be closely watched or studied by others. Examples of such situations include:  Interacting socially (having a conversation with others, going to a party, or meeting strangers).  Being observed (eating or drinking in public or being called on in class).  Performing in front of others (giving a speech).  The social situations of concern almost always cause fear or anxiety, not just occasionally.  People with social anxiety disorder fear that they will be viewed negatively in a way that will be embarrassing, will lead to rejection, or will offend others. This fear is out of proportion to the actual threat posed by the social situation.  Often the triggering social situations are avoided, or they are endured with intense fear or anxiety. The fear, anxiety, or avoidance is persistent and lasts for 6 months or longer.  The anxiety causes difficulty functioning in at least some parts of your daily life. TREATMENT  Several types of treatment are available for social anxiety disorder. These treatments are often used in combination and include:   Talk therapy. Group talk therapy allows you to see that you are not alone with these problems. Individual talk therapy helps you address your specific anxiety issues with a caring professional. The most effective forms of talk therapy for social anxiety disorder are cognitive behavioral therapy and exposure therapy. Cognitive behavioral therapy helps you to identify and change negative thoughts and beliefs that are at the root of the disorder. Exposure therapy allows you to gradually face the situations that you fear most.  Relaxation and coping techniques. These include deep breathing, self-talk, meditation, visual imagery, and yoga.  Relaxation techniques help to keep you calm in social situations.  Social Optician, dispensing.Social skills can be learned on your own or with the help of a talk therapist. They can help you feel more confident and comfortable in social situations.  Medication. For anxiety limited to performance situations (performance anxiety), medication called beta blockers can help by reducing or preventing the physical symptoms of social anxiety disorder. For more persistent and generalized social anxiety, antidepressant medication may be prescribed to help control symptoms. In severe cases of social anxiety disorder, strong antianxiety medication, called benzodiazepines, may be prescribed on a limited basis and for a short time. Document Released: 05/13/2005 Document Revised: 10/09/2012 Document Reviewed: 09/12/2012 [facility] Patient Information 2014 [place], [place].

## 2013-08-20 NOTE — ED Notes (Signed)
Bed: ZO10WA13 Expected date:  Expected time:  Means of arrival:  Comments: Untriaged Pt

## 2013-08-24 NOTE — Assessment & Plan Note (Addendum)
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 This may very well have been a [employer] based product which can be sensitizing. Simply a strong odor might have done this to her. We discussed cyclical cough with a reflux component, asthma, other explanations. Plan-nebulizer treatment with Xopenex, Tussionex, prednisone taper, sample Breo Ellipta. Explore possibility of a reflux component using acid blockers/liquid antacids as discussed

## 2013-08-27 ENCOUNTER — Telehealth: Payer: Self-pay | Admitting: Internal Medicine

## 2013-08-27 NOTE — Telephone Encounter (Signed)
Pt is aware of MW's response. Declined coming in to be seen. She asked about what meds OTC she could use. Advised Delsym or Robitussin. Nothing further was needed at this time.

## 2013-08-27 NOTE — Telephone Encounter (Signed)
Spoke with pt. She is not returning our call. Needs refill on Tussionex and wants her work note extended. Pt was very hoarse on the phone while speaking to her. Advised pt that CY was gone for the day and is off tomorrow and this would have to be addressed by one of the other MDs. She agreed.  Last OV 08/08/13 No pending OV Last fill of Tussionex 08/13/13  MW - please advise on refill. Thanks.

## 2013-08-27 NOTE — Telephone Encounter (Signed)
No can do I can offer her to see her 3/3 with all meds in hand

## 2013-08-29 ENCOUNTER — Encounter: Payer: Self-pay | Admitting: *Deleted

## 2013-08-29 ENCOUNTER — Telehealth: Payer: Self-pay | Admitting: Internal Medicine

## 2013-08-29 MED ORDER — HYDROCOD POLST-CHLORPHEN POLST 10-8 MG/5ML PO LQCR: 5.0000 mL | Freq: Two times a day (BID) | ORAL | Status: AC | PRN

## 2013-08-29 NOTE — Telephone Encounter (Signed)
Called spoke with patient and advised her Tussionex rx will be up front with her letter tomorrow.  Pt also aware she needs to make this rx last a month.  Nothing further needed; will sign off.

## 2013-08-29 NOTE — Telephone Encounter (Signed)
Please let patient know that Rx will be at front for pick up with letter tomorrow but she needs to make sure to make the Rx last a month.

## 2013-08-29 NOTE — Telephone Encounter (Signed)
Pt is aware and will pick letter up tomorrow. She is also asking to pick up rx for tussionex as well. Last refilled 08/13/13 #200 ml Please advise Dr. Maple HudsonYoung thanks

## 2013-08-29 NOTE — Telephone Encounter (Signed)
Per CY-okay to extend note to 09-03-13.

## 2013-08-29 NOTE — Telephone Encounter (Signed)
I called and spoke with pt. She is wanting her work note extended to 09/03/13. Original note was given on 08/13/13. She reprots she is still not feeling well and pt was very hoarse while speaking w/ her on the phone.  Pt called 08/27/13 and was offered OV 08/28/13 but declined. Pt last OV 08/08/13. Please advise Dr. Maple HudsonYoung thanks

## 2013-09-18 ENCOUNTER — Telehealth: Payer: Self-pay | Admitting: Internal Medicine

## 2013-09-18 NOTE — Telephone Encounter (Signed)
Called spoke with Danna. She reports she does not need any information from our office any longer. Will sign off message

## 2013-12-13 ENCOUNTER — Other Ambulatory Visit: Payer: Self-pay

## 2013-12-13 DIAGNOSIS — Z1231 Encounter for screening mammogram for malignant neoplasm of breast: Secondary | ICD-10-CM

## 2013-12-20 ENCOUNTER — Ambulatory Visit
Admission: RE | Admit: 2013-12-20 | Discharge: 2013-12-20 | Disposition: A | Discharge status: Home / Self Care | Payer: PRIVATE HEALTH INSURANCE | Source: Ambulatory Visit

## 2013-12-20 DIAGNOSIS — Z1231 Encounter for screening mammogram for malignant neoplasm of breast: Secondary | ICD-10-CM

## 2014-05-21 ENCOUNTER — Other Ambulatory Visit: Payer: Self-pay | Admitting: Internal Medicine

## 2015-03-11 ENCOUNTER — Other Ambulatory Visit: Payer: Self-pay

## 2015-03-11 DIAGNOSIS — Z1231 Encounter for screening mammogram for malignant neoplasm of breast: Secondary | ICD-10-CM

## 2015-04-08 ENCOUNTER — Ambulatory Visit
Admission: RE | Admit: 2015-04-08 | Discharge: 2015-04-08 | Disposition: A | Discharge status: Home / Self Care | Payer: PRIVATE HEALTH INSURANCE | Source: Ambulatory Visit

## 2015-04-08 DIAGNOSIS — Z1231 Encounter for screening mammogram for malignant neoplasm of breast: Secondary | ICD-10-CM

## 2015-06-08 ENCOUNTER — Other Ambulatory Visit: Payer: Self-pay | Admitting: Internal Medicine

## 2015-06-09 NOTE — Telephone Encounter (Signed)
Rx request received from [facility] on [address] Pt. Last ov with dr. Maple HudsonYoung was 07/2013. Called and spoke with pt. Regarding request made appointment for 08/2015. Pt. Aware to keep app. For refills. 1 refill sent to [facility].

## 2015-06-09 NOTE — Telephone Encounter (Deleted)
Received rx refill request for Proventil inhaler. Called and spoke with pt. Last ov 07/2013 made an appointment 08/2015

## 2015-06-25 ENCOUNTER — Other Ambulatory Visit: Payer: Self-pay | Admitting: Orthopaedic Surgery

## 2015-06-25 DIAGNOSIS — M545 Low back pain: Secondary | ICD-10-CM

## 2015-06-27 ENCOUNTER — Ambulatory Visit
Admission: RE | Admit: 2015-06-27 | Discharge: 2015-06-27 | Disposition: A | Discharge status: Home / Self Care | Payer: PRIVATE HEALTH INSURANCE | Source: Ambulatory Visit | Attending: Orthopaedic Surgery | Admitting: Orthopaedic Surgery

## 2015-06-27 DIAGNOSIS — M545 Low back pain: Secondary | ICD-10-CM

## 2015-07-03 ENCOUNTER — Other Ambulatory Visit: Payer: Self-pay | Admitting: Specialist

## 2015-07-03 ENCOUNTER — Other Ambulatory Visit: Payer: Self-pay

## 2015-07-03 ENCOUNTER — Other Ambulatory Visit (HOSPITAL_COMMUNITY): Payer: Self-pay | Admitting: Specialist

## 2015-07-03 DIAGNOSIS — M79662 Pain in left lower leg: Secondary | ICD-10-CM

## 2015-07-03 NOTE — Progress Notes (Signed)
Unable to reach pt for pre-op call. Left pre-op instructions according to pre-op call checklist on pt's voicemail. Instructed pt to arrive at 10:00 AM and NPO after MN. She is on 3 pain medications, I instructed her that she may take one of those with a sip of water.

## 2015-07-04 ENCOUNTER — Observation Stay (HOSPITAL_BASED_OUTPATIENT_CLINIC_OR_DEPARTMENT_OTHER): Payer: PRIVATE HEALTH INSURANCE

## 2015-07-04 ENCOUNTER — Ambulatory Visit (HOSPITAL_COMMUNITY): Payer: PRIVATE HEALTH INSURANCE

## 2015-07-04 ENCOUNTER — Ambulatory Visit (HOSPITAL_COMMUNITY): Payer: PRIVATE HEALTH INSURANCE | Admitting: Certified Registered Nurse Anesthetist

## 2015-07-04 ENCOUNTER — Encounter (HOSPITAL_COMMUNITY)
Admission: RE | Disposition: A | Discharge status: Home / Self Care | Payer: PRIVATE HEALTH INSURANCE | Source: Ambulatory Visit | Attending: Specialist

## 2015-07-04 ENCOUNTER — Observation Stay (HOSPITAL_COMMUNITY)
Admission: RE | Admit: 2015-07-04 | Discharge: 2015-07-04 | Disposition: A | Discharge status: Home / Self Care | Payer: PRIVATE HEALTH INSURANCE | Source: Ambulatory Visit | Attending: Specialist | Admitting: Specialist

## 2015-07-04 ENCOUNTER — Encounter (HOSPITAL_COMMUNITY): Payer: Self-pay | Admitting: *Deleted

## 2015-07-04 DIAGNOSIS — M79602 Pain in left arm: Secondary | ICD-10-CM | POA: Diagnosis not present

## 2015-07-04 DIAGNOSIS — N189 Chronic kidney disease, unspecified: Secondary | ICD-10-CM | POA: Diagnosis not present

## 2015-07-04 DIAGNOSIS — M79605 Pain in left leg: Secondary | ICD-10-CM

## 2015-07-04 DIAGNOSIS — M5127 Other intervertebral disc displacement, lumbosacral region: Secondary | ICD-10-CM | POA: Diagnosis present

## 2015-07-04 DIAGNOSIS — Z419 Encounter for procedure for purposes other than remedying health state, unspecified: Secondary | ICD-10-CM

## 2015-07-04 DIAGNOSIS — M5126 Other intervertebral disc displacement, lumbar region: Secondary | ICD-10-CM | POA: Diagnosis present

## 2015-07-04 DIAGNOSIS — J45909 Unspecified asthma, uncomplicated: Secondary | ICD-10-CM | POA: Diagnosis not present

## 2015-07-04 HISTORY — DX: Other complications of anesthesia, initial encounter: T88.59XA

## 2015-07-04 HISTORY — PX: LUMBAR LAMINECTOMY: SHX95

## 2015-07-04 HISTORY — DX: Headache, unspecified: R51.9

## 2015-07-04 HISTORY — DX: Anxiety disorder, unspecified: F41.9

## 2015-07-04 HISTORY — DX: Adverse effect of unspecified anesthetic, initial encounter: T41.45XA

## 2015-07-04 HISTORY — DX: Headache: R51

## 2015-07-04 HISTORY — DX: Chronic kidney disease, unspecified: N18.9

## 2015-07-04 LAB — COMPREHENSIVE METABOLIC PANEL
ALBUMIN: 4.1 g/dL (ref 3.5–5.0)
ALT: 24 U/L (ref 14–54)
AST: 30 U/L (ref 15–41)
Alkaline Phosphatase: 91 U/L (ref 38–126)
Anion gap: 11 (ref 5–15)
BUN: 10 mg/dL (ref 6–20)
CHLORIDE: 110 mmol/L (ref 101–111)
CO2: 24 mmol/L (ref 22–32)
CREATININE: 1.01 mg/dL — AB (ref 0.44–1.00)
Calcium: 9.7 mg/dL (ref 8.9–10.3)
GFR calc Af Amer: >60 mL/min (ref >60)
GFR calc non Af Amer: >60 mL/min (ref >60)
GLUCOSE: 99 mg/dL (ref 65–99)
POTASSIUM: 4 mmol/L (ref 3.5–5.1)
Sodium: 145 mmol/L (ref 135–145)
Total Bilirubin: 0.7 mg/dL (ref 0.3–1.2)
Total Protein: 7.5 g/dL (ref 6.5–8.1)

## 2015-07-04 LAB — CBC
HEMATOCRIT: 35.6 % — AB (ref 36.0–46.0)
Hemoglobin: 12 g/dL (ref 12.0–15.0)
MCH: 28.8 pg (ref 26.0–34.0)
MCHC: 33.7 g/dL (ref 30.0–36.0)
MCV: 85.4 fL (ref 78.0–100.0)
PLATELETS: 227 10*3/uL (ref 150–400)
RBC: 4.17 MIL/uL (ref 3.87–5.11)
RDW: 13 % (ref 11.5–15.5)
WBC: 5 10*3/uL (ref 4.0–10.5)

## 2015-07-04 LAB — SURGICAL PCR SCREEN
MRSA, PCR: NEGATIVE
Staphylococcus aureus: NEGATIVE

## 2015-07-04 SURGERY — MICRODISCECTOMY LUMBAR LAMINECTOMY
Anesthesia: General

## 2015-07-04 MED ORDER — HYDROMORPHONE HCL 1 MG/ML IJ SOLN
0.2500 mg | INTRAMUSCULAR | Status: DC | PRN
  Administered 2015-07-04 (×2): 0.5 mg via INTRAVENOUS

## 2015-07-04 MED ORDER — PHENYLEPHRINE 40 MCG/ML (10ML) SYRINGE FOR IV PUSH (FOR BLOOD PRESSURE SUPPORT)
PREFILLED_SYRINGE | INTRAVENOUS | Status: AC
  Filled 2015-07-04: qty 10

## 2015-07-04 MED ORDER — ASPIRIN EC 325 MG PO TBEC: 325.0000 mg | DELAYED_RELEASE_TABLET | Freq: Two times a day (BID) | ORAL | Status: DC

## 2015-07-04 MED ORDER — BUPIVACAINE LIPOSOME 1.3 % IJ SUSP
INTRAMUSCULAR | Status: DC | PRN
  Administered 2015-07-04: 13 mL

## 2015-07-04 MED ORDER — MORPHINE SULFATE (PF) 2 MG/ML IV SOLN: 1.0000 mg | INTRAVENOUS | Status: DC | PRN

## 2015-07-04 MED ORDER — CHLORHEXIDINE GLUCONATE 4 % EX LIQD: 60.0000 mL | Freq: Once | CUTANEOUS | Status: DC

## 2015-07-04 MED ORDER — LIDOCAINE HCL (PF) 2 % IJ SOLN
INTRAMUSCULAR | Status: DC | PRN
  Administered 2015-07-04: 80 mg

## 2015-07-04 MED ORDER — MUPIROCIN 2 % EX OINT: 1.0000 "application " | TOPICAL_OINTMENT | Freq: Once | CUTANEOUS | Status: DC

## 2015-07-04 MED ORDER — BUPIVACAINE HCL (PF) 0.5 % IJ SOLN
INTRAMUSCULAR | Status: AC
  Filled 2015-07-04: qty 30

## 2015-07-04 MED ORDER — THROMBIN 20000 UNITS EX SOLR
CUTANEOUS | Status: AC
  Filled 2015-07-04: qty 20000

## 2015-07-04 MED ORDER — METHOCARBAMOL 1000 MG/10ML IJ SOLN: 500.0000 mg | Freq: Four times a day (QID) | INTRAVENOUS | Status: DC | PRN

## 2015-07-04 MED ORDER — ONDANSETRON HCL 4 MG/2ML IJ SOLN
INTRAMUSCULAR | Status: DC | PRN
  Administered 2015-07-04: 4 mg via INTRAVENOUS

## 2015-07-04 MED ORDER — PHENYLEPHRINE HCL 10 MG/ML IJ SOLN
INTRAMUSCULAR | Status: DC | PRN
  Administered 2015-07-04: 80 ug via INTRAVENOUS
  Administered 2015-07-04: 120 ug via INTRAVENOUS
  Administered 2015-07-04 (×3): 80 ug via INTRAVENOUS
  Administered 2015-07-04: 120 ug via INTRAVENOUS
  Administered 2015-07-04: 80 ug via INTRAVENOUS

## 2015-07-04 MED ORDER — DEXTROSE 5 % IV SOLN
10.0000 mg | INTRAVENOUS | Status: DC | PRN
  Administered 2015-07-04: 15 ug/min via INTRAVENOUS

## 2015-07-04 MED ORDER — FENTANYL CITRATE (PF) 250 MCG/5ML IJ SOLN
INTRAMUSCULAR | Status: AC
  Filled 2015-07-04: qty 5

## 2015-07-04 MED ORDER — ARTIFICIAL TEARS OP OINT
TOPICAL_OINTMENT | OPHTHALMIC | Status: DC | PRN
  Administered 2015-07-04: 1 via OPHTHALMIC

## 2015-07-04 MED ORDER — ONDANSETRON HCL 4 MG/2ML IJ SOLN: 4.0000 mg | Freq: Once | INTRAMUSCULAR | Status: DC | PRN

## 2015-07-04 MED ORDER — ONDANSETRON HCL 4 MG/2ML IJ SOLN
INTRAMUSCULAR | Status: AC
  Filled 2015-07-04: qty 2

## 2015-07-04 MED ORDER — METHOCARBAMOL 500 MG PO TABS
500.0000 mg | ORAL_TABLET | Freq: Four times a day (QID) | ORAL | Status: DC | PRN
  Administered 2015-07-04: 500 mg via ORAL

## 2015-07-04 MED ORDER — KETOROLAC TROMETHAMINE 30 MG/ML IJ SOLN
INTRAMUSCULAR | Status: AC
  Filled 2015-07-04: qty 1

## 2015-07-04 MED ORDER — OXYCODONE-ACETAMINOPHEN 5-325 MG PO TABS: 1.0000 | ORAL_TABLET | ORAL | Status: DC | PRN

## 2015-07-04 MED ORDER — KETOROLAC TROMETHAMINE 30 MG/ML IJ SOLN: 30.0000 mg | Freq: Once | INTRAMUSCULAR | Status: DC

## 2015-07-04 MED ORDER — PROPOFOL 10 MG/ML IV BOLUS
INTRAVENOUS | Status: AC
  Filled 2015-07-04: qty 20

## 2015-07-04 MED ORDER — LIDOCAINE HCL (CARDIAC) 20 MG/ML IV SOLN
INTRAVENOUS | Status: DC | PRN
  Administered 2015-07-04: 60 mg via INTRAVENOUS

## 2015-07-04 MED ORDER — MIDAZOLAM HCL 2 MG/2ML IJ SOLN
INTRAMUSCULAR | Status: AC
  Filled 2015-07-04: qty 2

## 2015-07-04 MED ORDER — CEFAZOLIN SODIUM-DEXTROSE 2-3 GM-% IV SOLR
2.0000 g | INTRAVENOUS | Status: AC
  Administered 2015-07-04: 2 g via INTRAVENOUS

## 2015-07-04 MED ORDER — METHOCARBAMOL 500 MG PO TABS: 500.0000 mg | ORAL_TABLET | Freq: Three times a day (TID) | ORAL | Status: DC | PRN

## 2015-07-04 MED ORDER — 0.9 % SODIUM CHLORIDE (POUR BTL) OPTIME
TOPICAL | Status: DC | PRN
  Administered 2015-07-04: 1000 mL

## 2015-07-04 MED ORDER — PROPOFOL 10 MG/ML IV BOLUS
INTRAVENOUS | Status: DC | PRN
  Administered 2015-07-04: 150 mg via INTRAVENOUS

## 2015-07-04 MED ORDER — OXYCODONE HCL 5 MG/5ML PO SOLN: 5.0000 mg | Freq: Once | ORAL | Status: DC | PRN

## 2015-07-04 MED ORDER — LIDOCAINE HCL (CARDIAC) 20 MG/ML IV SOLN
INTRAVENOUS | Status: AC
  Filled 2015-07-04: qty 5

## 2015-07-04 MED ORDER — OXYCODONE HCL 5 MG PO TABS: 5.0000 mg | ORAL_TABLET | Freq: Once | ORAL | Status: DC | PRN

## 2015-07-04 MED ORDER — SUCCINYLCHOLINE CHLORIDE 20 MG/ML IJ SOLN
INTRAMUSCULAR | Status: DC | PRN
  Administered 2015-07-04: 100 mg via INTRAVENOUS

## 2015-07-04 MED ORDER — THROMBIN 20000 UNITS EX KIT
PACK | CUTANEOUS | Status: DC | PRN
  Administered 2015-07-04: 14:00:00 via TOPICAL

## 2015-07-04 MED ORDER — FENTANYL CITRATE (PF) 100 MCG/2ML IJ SOLN
INTRAMUSCULAR | Status: DC | PRN
  Administered 2015-07-04 (×2): 25 ug via INTRAVENOUS
  Administered 2015-07-04: 100 ug via INTRAVENOUS
  Administered 2015-07-04: 25 ug via INTRAVENOUS
  Administered 2015-07-04: 50 ug via INTRAVENOUS
  Administered 2015-07-04: 25 ug via INTRAVENOUS

## 2015-07-04 MED ORDER — METHOCARBAMOL 500 MG PO TABS
ORAL_TABLET | ORAL | Status: AC
  Filled 2015-07-04: qty 1

## 2015-07-04 MED ORDER — ARTIFICIAL TEARS OP OINT
TOPICAL_OINTMENT | OPHTHALMIC | Status: AC
  Filled 2015-07-04: qty 3.5

## 2015-07-04 MED ORDER — MIDAZOLAM HCL 5 MG/5ML IJ SOLN
INTRAMUSCULAR | Status: DC | PRN
  Administered 2015-07-04: 2 mg via INTRAVENOUS

## 2015-07-04 MED ORDER — BUPIVACAINE HCL 0.5 % IJ SOLN
INTRAMUSCULAR | Status: DC | PRN
  Administered 2015-07-04: 13 mL

## 2015-07-04 MED ORDER — CEFAZOLIN SODIUM-DEXTROSE 2-3 GM-% IV SOLR
INTRAVENOUS | Status: AC
  Filled 2015-07-04: qty 50

## 2015-07-04 MED ORDER — BUPIVACAINE LIPOSOME 1.3 % IJ SUSP
20.0000 mL | Freq: Once | INTRAMUSCULAR | Status: DC
  Filled 2015-07-04: qty 20

## 2015-07-04 MED ORDER — SUCCINYLCHOLINE CHLORIDE 20 MG/ML IJ SOLN
INTRAMUSCULAR | Status: AC
  Filled 2015-07-04: qty 1

## 2015-07-04 MED ORDER — HYDROMORPHONE HCL 1 MG/ML IJ SOLN
INTRAMUSCULAR | Status: AC
  Administered 2015-07-04: 1 mg
  Filled 2015-07-04: qty 2

## 2015-07-04 MED ORDER — LACTATED RINGERS IV SOLN: INTRAVENOUS | Status: DC

## 2015-07-04 MED ORDER — ROCURONIUM BROMIDE 50 MG/5ML IV SOLN
INTRAVENOUS | Status: AC
  Filled 2015-07-04: qty 1

## 2015-07-04 MED ORDER — LACTATED RINGERS IV SOLN
INTRAVENOUS | Status: DC
  Administered 2015-07-04 (×2): via INTRAVENOUS

## 2015-07-04 SURGICAL SUPPLY — 52 items
BUR ROUND FLUTED 4 SOFT TCH (BURR) ×2 IMPLANT
BUR ROUND FLUTED 4MM SOFT TCH (BURR) ×1
BUR SABER RD CUTTING 3.0 (BURR) ×2 IMPLANT
BUR SABER RD CUTTING 3.0MM (BURR) ×1
CANISTER SUCTION 2500CC (MISCELLANEOUS) ×3 IMPLANT
COVER MAYO STAND STRL (DRAPES) ×3 IMPLANT
COVER SURGICAL LIGHT HANDLE (MISCELLANEOUS) ×3 IMPLANT
DERMABOND ADVANCED (GAUZE/BANDAGES/DRESSINGS) ×2
DERMABOND ADVANCED .7 DNX12 (GAUZE/BANDAGES/DRESSINGS) ×1 IMPLANT
DRAPE C-ARM 42X72 X-RAY (DRAPES) IMPLANT
DRAPE MICROSCOPE LEICA (MISCELLANEOUS) ×3 IMPLANT
DRAPE PROXIMA HALF (DRAPES) IMPLANT
DRAPE SURG 17X23 STRL (DRAPES) ×12 IMPLANT
DRSG MEPILEX BORDER 4X4 (GAUZE/BANDAGES/DRESSINGS) ×3 IMPLANT
DRSG MEPILEX BORDER 4X8 (GAUZE/BANDAGES/DRESSINGS) IMPLANT
DURAPREP 26ML APPLICATOR (WOUND CARE) ×3 IMPLANT
ELECT BLADE 4.0 EZ CLEAN MEGAD (MISCELLANEOUS) ×3
ELECT CAUTERY BLADE 6.4 (BLADE) ×3 IMPLANT
ELECT REM PT RETURN 9FT ADLT (ELECTROSURGICAL) ×3
ELECTRODE BLDE 4.0 EZ CLN MEGD (MISCELLANEOUS) ×1 IMPLANT
ELECTRODE REM PT RTRN 9FT ADLT (ELECTROSURGICAL) ×1 IMPLANT
GLOVE BIOGEL PI IND STRL 8 (GLOVE) ×2 IMPLANT
GLOVE BIOGEL PI INDICATOR 8 (GLOVE) ×4
GLOVE ECLIPSE 8.5 STRL (GLOVE) ×6 IMPLANT
GLOVE ORTHO TXT STRL SZ7.5 (GLOVE) ×6 IMPLANT
GLOVE SURG 8.5 LATEX PF (GLOVE) ×6 IMPLANT
GOWN STRL REUS W/ TWL LRG LVL3 (GOWN DISPOSABLE) ×1 IMPLANT
GOWN STRL REUS W/TWL 2XL LVL3 (GOWN DISPOSABLE) ×6 IMPLANT
GOWN STRL REUS W/TWL LRG LVL3 (GOWN DISPOSABLE) ×2
KIT BASIN OR (CUSTOM PROCEDURE TRAY) ×3 IMPLANT
KIT ROOM TURNOVER OR (KITS) ×3 IMPLANT
NEEDLE SPNL 18GX3.5 QUINCKE PK (NEEDLE) ×6 IMPLANT
NS IRRIG 1000ML POUR BTL (IV SOLUTION) ×3 IMPLANT
PACK LAMINECTOMY ORTHO (CUSTOM PROCEDURE TRAY) ×3 IMPLANT
PAD ARMBOARD 7.5X6 YLW CONV (MISCELLANEOUS) ×6 IMPLANT
PATTIES SURGICAL .5 X.5 (GAUZE/BANDAGES/DRESSINGS) IMPLANT
PATTIES SURGICAL .75X.75 (GAUZE/BANDAGES/DRESSINGS) ×3 IMPLANT
SPONGE LAP 4X18 X RAY DECT (DISPOSABLE) IMPLANT
SPONGE SURGIFOAM ABS GEL 100 (HEMOSTASIS) ×3 IMPLANT
STAPLER VISISTAT 35W (STAPLE) ×3 IMPLANT
SUT VIC AB 1 CTX 36 (SUTURE) ×2
SUT VIC AB 1 CTX36XBRD ANBCTR (SUTURE) ×1 IMPLANT
SUT VIC AB 2-0 CT1 27 (SUTURE) ×2
SUT VIC AB 2-0 CT1 TAPERPNT 27 (SUTURE) ×1 IMPLANT
SUT VIC AB 3-0 X1 27 (SUTURE) ×3 IMPLANT
SUT VICRYL 0 UR6 27IN ABS (SUTURE) ×3 IMPLANT
SYR 20CC LL (SYRINGE) ×3 IMPLANT
SYR CONTROL 10ML LL (SYRINGE) ×3 IMPLANT
TOWEL OR 17X24 6PK STRL BLUE (TOWEL DISPOSABLE) ×3 IMPLANT
TOWEL OR 17X26 10 PK STRL BLUE (TOWEL DISPOSABLE) ×3 IMPLANT
TRAY FOLEY CATH 16FRSI W/METER (SET/KITS/TRAYS/PACK) IMPLANT
WATER STERILE IRR 1000ML POUR (IV SOLUTION) ×3 IMPLANT

## 2015-07-04 NOTE — Interval H&P Note (Signed)
History and Physical Interval Note:  07/04/2015 12:52 PM  [name]  has presented today for surgery, with the diagnosis of left L5-S1 herniated nucleus pulposus  The various methods of treatment have been discussed with the patient and family. After consideration of risks, benefits and other options for treatment, the patient has consented to  Procedure(s): Left L5-S1 MICRODISCECTOMY (N/A) as a surgical intervention .  The patient's history has been reviewed, patient examined, no change in status, stable for surgery.  I have reviewed the patient's chart and labs.  Questions were answered to the patient's satisfaction.     Barney Russomanno E

## 2015-07-04 NOTE — Op Note (Signed)
07/04/2015  2:38 PM  PATIENT:  [name]  55 y.o. female  MRN: [id number]  OPERATIVE REPORT  PRE-OPERATIVE DIAGNOSIS:  left L5-S1 herniated nucleus pulposus  POST-OPERATIVE DIAGNOSIS:  left L5-S1 herniated nucleus pulposus  PROCEDURE:  Procedure(s): Left L5-S1 MICRODISCECTOMY    SURGEON:  Jessy Oto, MD     ASSISTANT:  Benjiman Core, PA-C  (Present throughout the entire procedure and necessary for completion of procedure in a timely manner)     ANESTHESIA:  General, supplemented with local anesthetic marcaine 0.5% 1:1 exparel 1.3% total 30cc.  [name].     COMPLICATIONS:  None.     COMPONENTS: None  EBL: <50cc  DRAINS: None.  PROCEDURE:The patient was met in the holding area, and the appropriate Left Lumbar level L5-S1 identified and marked with "x" and my initials.The patient was then transported to OR and was placed under general anesthesia without difficulty. The patient received appropriate preoperative antibiotic prophylaxis. The patient after intubation atraumatically was transferred to the operating room table, prone position, Wilson frame, sliding OR table. All pressure points were well padded. The arms in 90-90 well-padded at the elbows. Standard prep with DuraPrep solution lower dorsal spine to the mid sacral segment. Draped in the usual manner iodine Vi-Drape was used. Time-out procedure was called and correct. 1 x 18-gauge spinal needle was then inserted at the expected L5-S1 level.Initial cross table lateral radiograph showed the needle at the L3-4 level, the needle was redirected and repeat cross table lateral radiograph showed needle directed At the L5-S1 level. The needle was at the upper aspect of the lamina of L5. Skin inferior to this was then infiltrated with Marcaine half percent 1:1 exparel 1.3% total of 20 cc used. An incision approximately an inch inch and a half in length was then made through skin and subcutaneous layers in line with the  expected midline just at the spinal needle entry point. An incision made into the left lumbosacral fascia approximately an inch in length .   Cobb used to perform subperiosteal movement of the left paralumbar muscles off of the posterior lamina of the expected L5-S1 level.  The depth measured off of the dilators at about 70 mm and 70 mm retractors and placed on the scaffolding for the Insight retractor equipment docking on the posterior aspect of the lamina at the expected L5-S1 level. This was sterilely attached to the articulating arm and it's up right which had been attached the OR table sterilely.Cross table lateral radiograph showed the retractors at the appropriate level L5-S1 and a Penfield #4 in the facet joint at the L5-S1 level. The operating room microscope sterilely draped brought into the field. Under the operating room microscope, the L5-S1 interspace carefully debrided the small amount of muscle attachment here and high-speed bur used to drill the medial aspect of the inferior articular process of L5 approximately 25%.  2 mm Kerrison then used to enter the spinal canal over the superior aspect of the S1 lamina carefully using the Kerrison to debris the attachment as a curet. Foraminotomy was then performed over the left S1 nerve root. The medial 10% superior articular process of S1 and then resected using 2 mm Kerrison. This allowed for identification of the thecal sac. Penfield 4 was then used to carefully mobilize the thecal sac medially and the S1 nerve root identified within the lateral recess flattened over the posterior aspect of the herniated disc. Carefully the lateral aspect of the S1 nerve root was identified  and a Penfield 4 was used to mobilize the nerve medially such that the herniated disc was visible with microscope. Using a Penfield 4 for retraction and a 15 blade scalpel was used to incise the posterior longitudinal ligament within the lateral recess on the left side longitudinally.  Disc material immediately extruded and this was removed using micropituitary rongeurs and nerve hook nerve root and then more easily able to be mobilized medially and retracted using a love retractor. Further foraminotomies was performed over the L5 nerve root the nerve root was noted to be decompressed without further compression. The nerve root able to be retracted along the medial aspect of the S1pedicle and disc material found to be subligamentous at this level was further resected current pituitary rongeurs. Ligamentum flavum was further debrided superiorly to the level L5-S1 disc. Had a moderate amount of further resection of the S1 lamina inferiorly was performed. With this then the disc space at L5-S1 was easily visualized and entry into the disc at the sided disc herniation was possible using a Penfield 4 intraoperative Lateral radiograph was used to identify the L5-S1 disc with the Penfield 4 In place just below the disc space.  Micropituitary was used to further debride this material superficially from the posterior aspect of the intervertebral disc is posterior lateral aspect of the disc. Small amount of further disc material was found subligamentous extending inferiorly from the disc this was removed using micropituitary rongeurs. Ligamentum flavum was debrided and lateral recess along the medial aspect L5-S1 facet no further decompression was necessary. Ball tip nerve probe was then able to carefully palpate the neuroforamen for L5and S1 finding these to be well decompressed. Bleeding was then controlled using thrombin-soaked Gelfoam small cottonoids.  Small amount of bleeding within the soft tissue mass the laminotomy area was controlled using bipolar electrocautery. Irrigation was carried out using copious amounts of irrigant solution. All Gelfoam  were then removed. No significant active bleeding present at the time of removal. All instruments sponge counts were correct traction system was then  carefully removed carefully rotating retractors with this withdrawal and only bipolar electrocautery of any small bleeders. The lumbar fascia infiltrated with local anesthetic for a total of 35 cc used. Lumbodorsal fascia was then carefully approximated with interrupted 0 Vicryl sutures, UR 6 needle deep subcutaneous layers were approximated with interrupted 0 Vicryl sutures on UR 6 the appear subcutaneous layers approximated with interrupted 2-0 Vicryl sutures and the skin closed with a running subcutaneous stitch of 4-0 Vicryl. Dermabond was applied allowed to dry and then Mepilex bandage applied. Patient was then carefully returned to supine position on a stretcher, reactivated and extubated. He was then returned to recovery room in satisfactory condition.  Benjiman Core, PA-C perform the duties of assistant surgeon during this case. he was present from the beginning of the case to the end of the case assisting in transfer the patient from his stretcher to the OR table and back to the stretcher at the end of the case. Assisted in careful retraction and suction of the laminectomy site delicate neural structures operating under the operating room microscope. He performed closure of the incision from the fascia to the skin applying the dressing.     Toby Breithaupt E 07/04/2015, 2:38 PM

## 2015-07-04 NOTE — Discharge Instructions (Signed)
    No lifting greater than 10 lbs. Avoid bending, stooping and twisting. Walk in house for first week them may start to get out slowly increasing distance up to one mile by 3 weeks post op. Keep incision dry for 3 days, may use tegaderm or similar water impervious dressing.  

## 2015-07-04 NOTE — Progress Notes (Addendum)
Awake, alert and oriented x 3 in recovery room.Mild left leg pain post op. First degree relative with fatal PE 2 weeks ago during post op period, 2 days out. Try to obtain venous doppler prior to discharge but noninvasive vascular study Not answering phone 4 PM Friday, inclimate weather expected this evening. Will check D dimer and a antiphospholipid syndrome panel. Recheck these lab prior to discharge. She is having some mild back pain in PACU post op and  Legs are NV normal. Will start on Aspirin post op BID If she is walking well and transitioning sitting to standing and walking then she can be Discharged home tonight. Please call me  [phone]  If her D dimer or antiphospholipid syndrome studies return positive then we would want To keep and have doppler studies prior to discharge.

## 2015-07-04 NOTE — Transfer of Care (Signed)
Immediate Anesthesia Transfer of Care Note  Patient: [name]  Procedure(s) Performed: Procedure(s): Left L5-S1 MICRODISCECTOMY (N/A)  Patient Location: PACU  Anesthesia Type:General  Level of Consciousness: awake, alert , oriented and patient cooperative  Airway & Oxygen Therapy: Patient Spontanous Breathing and Patient connected to nasal cannula oxygen  Post-op Assessment: Report given to RN and Post -op Vital signs reviewed and stable  Post vital signs: Reviewed and stable  Last Vitals:  Filed Vitals:   07/04/15 1003  BP: 136/72  Pulse: 78  Temp: 36.5 C  Resp: 18    Complications: No apparent anesthesia complications

## 2015-07-04 NOTE — Anesthesia Procedure Notes (Signed)
Procedure Name: Intubation Date/Time: 07/04/2015 1:02 PM Performed by: Salli Quarry Stanislaw Acton Pre-anesthesia Checklist: Patient identified, Emergency Drugs available, Suction available and Patient being monitored Patient Re-evaluated:Patient Re-evaluated prior to inductionOxygen Delivery Method: Circle system utilized Preoxygenation: Pre-oxygenation with 100% oxygen Intubation Type: IV induction Ventilation: Mask ventilation without difficulty Laryngoscope Size: Mac and 3 Grade View: Grade I Tube type: Oral Tube size: 7.5 mm Number of attempts: 1 Airway Equipment and Method: Stylet and LTA kit utilized Placement Confirmation: ETT inserted through vocal cords under direct vision,  positive ETCO2 and breath sounds checked- equal and bilateral Secured at: 23 cm Tube secured with: Tape Dental Injury: Teeth and Oropharynx as per pre-operative assessment

## 2015-07-04 NOTE — Brief Op Note (Signed)
07/04/2015  2:35 PM  PATIENT:  [name]  55 y.o. female  PRE-OPERATIVE DIAGNOSIS:  left L5-S1 herniated nucleus pulposus  POST-OPERATIVE DIAGNOSIS:  left L5-S1 herniated nucleus pulposus  PROCEDURE:  Procedure(s): Left L5-S1 MICRODISCECTOMY (N/A)  SURGEON:  Surgeon(s) and Role: Kerrin ChampagneJames E Nitka, MD - Primary  PHYSICIAN ASSISTANT:James Owens,PA-C   ANESTHESIA:   local and general Marcene Duosobert Fitzgerald, MD.  EBL:  Total I/O In: 1000 [I.V.:1000] Out: -   BLOOD ADMINISTERED:none  DRAINS: none   LOCAL MEDICATIONS USED:  MARCAINE 0.5% 1:1 EXPAREL 1.3% Amount: 30 ml  SPECIMEN:  No Specimen  DISPOSITION OF SPECIMEN:  N/A  COUNTS:  YES  TOURNIQUET:  * No tourniquets in log *  DICTATION: .Dragon Dictation  PLAN OF CARE: Admit for overnight observation  PATIENT DISPOSITION:  PACU - hemodynamically stable.   Delay start of Pharmacological VTE agent (>24hrs) due to surgical blood loss or risk of bleeding: yes

## 2015-07-04 NOTE — Progress Notes (Signed)
VASCULAR LAB PRELIMINARY  PRELIMINARY  PRELIMINARY  PRELIMINARY  Bilateral lower extremity venous duplex  completed.    Preliminary report:  Bilateral:  No evidence of DVT, superficial thrombosis, or Baker's Cyst.    Deionte Spivack, RVT 07/04/2015, 4:54 PM

## 2015-07-04 NOTE — H&P (Signed)
Carol ChessmanMari L Mcdonald is an 55 y.o. female.   Chief Complaint:  Worsening left leg pain HPI:  55 yo. Bf with worsening left LE radiculopathy x 2 months. No injury.  S/p previous surgery mid 1990's on lumbar spine.  She's had recent lumbar spine mri which showed a moderated left L5-S1 HNP.  Denies bowel/bladder incontinence.  No right leg symptoms.  States that she had brother that passed away last month from a PE.    Past Medical History  Diagnosis Date  . Insomnia   . Asthma   . Hyperventilation   . Pleurisy   . Cough   . Anxiety   . Chronic kidney disease   . Headache     hx. migraines  . Complication of anesthesia     was slow to wake up with one of her surgeries    Past Surgical History  Procedure Laterality Date  . Lumbar spine surgery    . Uterine fibroid surgery    . Vesicovaginal fistula closure w/ tah      Family History  Problem Relation Age of Onset  . Prostate cancer Father    Social History:  reports that she has never smoked. She has never used smokeless tobacco. She reports that she does not drink alcohol or use illicit drugs.  Allergies:  Allergies  Allergen Reactions  . Doxycycline Anaphylaxis  . Metoclopramide Hcl Shortness Of Breath and Swelling  . Prochlorperazine Edisylate Anaphylaxis    Medications Prior to Admission  Medication Sig Dispense Refill  . albuterol (PROVENTIL) (2.5 MG/3ML) 0.083% nebulizer solution Take 2.5 mg by nebulization every 4 (four) hours as needed for wheezing or shortness of breath.    . Calcium Carbonate-Vitamin D (CALCIUM-D) 600-400 MG-UNIT TABS Take 1 tablet by mouth daily.    Marland Kitchen. gabapentin (NEURONTIN) 300 MG capsule Take 300 mg by mouth 2 (two) times daily.    Marland Kitchen. HYDROcodone-acetaminophen (NORCO/VICODIN) 5-325 MG per tablet Take 1 tablet by mouth every 4 (four) hours as needed. 10 tablet 0  . HYDROmorphone (DILAUDID) 2 MG tablet Take 1 tablet by mouth 2 (two) times daily as needed.  0  . methocarbamol (ROBAXIN) 500 MG tablet Take  500 mg by mouth every 8 (eight) hours as needed for muscle spasms.    Marland Kitchen. oxyCODONE-acetaminophen (PERCOCET/ROXICET) 5-325 MG tablet Take 1 tablet by mouth every 4 (four) hours as needed.  0  . PROVENTIL HFA 108 (90 BASE) MCG/ACT inhaler INHALE 2 PUFFS BY MOUTH INTO THE LUNGS EVERY 4 HOURS AS NEEDED FOR WHEEZING OR SHORTNESS OF BREATH 6.7 g 0    Results for orders placed or performed during the hospital encounter of 07/04/15 (from the past 48 hour(s))  CBC     Status: Abnormal   Collection Time: 07/04/15 10:08 AM  Result Value Ref Range   WBC 5.0 4.0 - 10.5 K/uL   RBC 4.17 3.87 - 5.11 MIL/uL   Hemoglobin 12.0 12.0 - 15.0 g/dL   HCT 16.135.6 (L) 09.636.0 - 04.546.0 %   MCV 85.4 78.0 - 100.0 fL   MCH 28.8 26.0 - 34.0 pg   MCHC 33.7 30.0 - 36.0 g/dL   RDW 40.913.0 81.111.5 - 91.415.5 %   Platelets 227 150 - 400 K/uL   No results found.  Review of Systems  Constitutional: Negative.   HENT: Negative.   Eyes: Negative.   Respiratory: Negative.   Cardiovascular: Negative.   Gastrointestinal: Negative.   Genitourinary: Negative.   Musculoskeletal: Positive for back pain.  Skin: Negative.  Neurological: Positive for tingling and focal weakness. Negative for dizziness and seizures.  Psychiatric/Behavioral: Negative.     Blood pressure 136/72, pulse 78, temperature 97.7 F (36.5 C), temperature source Oral, resp. rate 18, height 5\' 5"  (1.651 m), weight 77.111 kg (170 lb), SpO2 100 %. Physical Exam  Constitutional: She is oriented to person, place, and time. She appears distressed.  HENT:  Head: Atraumatic.  Eyes: EOM are normal. Pupils are equal, round, and reactive to light.  Neck: Normal range of motion.  Respiratory: Effort normal.  GI: She exhibits no distension.  Musculoskeletal: She exhibits tenderness.  Gait antalgic. Positive left SLR.  Lumbar paraspinal ttp. Left sciatic notch ttp. Trace left gastroc weakness.  Posterior left knee and prox calf ttp.    Neurological: She is alert and oriented to  person, place, and time.     Assessment/Plan Left L5-S1 HNP.  Will proceed with microdiscectomy procedure.  Surgical procedure along with possible risks and complications discussed including DVT/PE.  With recent family hx and leg pain we will go ahead and get a preop venous doppler. All questions answered.    OWENS,JAMES M 07/04/2015, 10:35 AM

## 2015-07-04 NOTE — Anesthesia Preprocedure Evaluation (Addendum)
Anesthesia Evaluation  Patient identified by MRN, date of birth, ID band Patient awake    Reviewed: Allergy & Precautions, NPO status , Patient's Chart, lab work & pertinent test results  Airway Mallampati: II  TM Distance: >3 FB Neck ROM: Full    Dental  (+) Dental Advisory Given   Pulmonary asthma ,    breath sounds clear to auscultation       Cardiovascular negative cardio ROS   Rhythm:Regular Rate:Normal     Neuro/Psych negative neurological ROS     GI/Hepatic negative GI ROS, Neg liver ROS,   Endo/Other  negative endocrine ROS  Renal/GU Renal InsufficiencyRenal disease     Musculoskeletal   Abdominal   Peds  Hematology negative hematology ROS (+)   Anesthesia Other Findings   Reproductive/Obstetrics                            Lab Results  Component Value Date   WBC 5.0 07/04/2015   HGB 12.0 07/04/2015   HCT 35.6* 07/04/2015   MCV 85.4 07/04/2015   PLT 227 07/04/2015   Lab Results  Component Value Date   CREATININE 1.01* 07/04/2015   BUN 10 07/04/2015   NA 145 07/04/2015   K 4.0 07/04/2015   CL 110 07/04/2015   CO2 24 07/04/2015    Anesthesia Physical Anesthesia Plan  ASA: II  Anesthesia Plan: General   Post-op Pain Management:    Induction: Intravenous  Airway Management Planned: Oral ETT  Additional Equipment:   Intra-op Plan:   Post-operative Plan: Extubation in OR  Informed Consent: I have reviewed the patients History and Physical, chart, labs and discussed the procedure including the risks, benefits and alternatives for the proposed anesthesia with the patient or authorized representative who has indicated his/her understanding and acceptance.   Dental advisory given  Plan Discussed with: CRNA  Anesthesia Plan Comments:         Anesthesia Quick Evaluation

## 2015-07-04 NOTE — Progress Notes (Signed)
Doppler imaging at patient's bedside to check venous study to r/o DVT. Will discontinue order for D dimer and antiphospholipid panel. Doppler studies should be adequate to assess for any risk of preop DVT.  I appreciate Non invasive Doppler study team for their help.

## 2015-07-07 ENCOUNTER — Encounter (HOSPITAL_COMMUNITY): Payer: Self-pay | Admitting: Specialist

## 2015-07-07 NOTE — Discharge Summary (Signed)
Physician Discharge Summary      Patient ID: [name] MRN: [id number] DOB/AGE: January 06, 1961 55 y.o.  Admit date: 07/04/2015 Discharge date: 07/04/2015  Admission Diagnoses:  Principal Problem:   HNP (herniated nucleus pulposus), lumbar   Discharge Diagnoses:  Same  Past Medical History  Diagnosis Date  . Insomnia   . Asthma   . Hyperventilation   . Pleurisy   . Cough   . Anxiety   . Chronic kidney disease   . Headache     hx. migraines  . Complication of anesthesia     was slow to wake up with one of her surgeries    Surgeries: Procedure(s): Left L5-S1 MICRODISCECTOMY on 07/04/2015   Consultants:    Discharged Condition: Improved  Hospital Course: [name] is an 55 y.o. female who was admitted 07/04/2015 with a chief complaint of No chief complaint on file. , and found to have a diagnosis of HNP (herniated nucleus pulposus), lumbar.  She was brought to the operating room on 07/04/2015 and underwent the above named procedures.    She was given perioperative antibiotics:  Anti-infectives    Start     Dose/Rate Route Frequency Ordered Stop   07/04/15 1000  ceFAZolin (ANCEF) IVPB 2 g/50 mL premix     2 g 100 mL/hr over 30 Minutes Intravenous To ShortStay Surgical 07/04/15 0952 07/04/15 1322   07/04/15 0745  ceFAZolin (ANCEF) 2-3 GM-% IVPB SOLR  Status:  Discontinued    Comments:  Edwina BarthHawks, Pamela   : cabinet override      07/04/15 0745 07/04/15 2025    She has a 3 week history of intractable sciatica, recent first degree relative death due to pulmonary embolus 2 weeks prior to her surgery today. Due to risk of hereditary hypercoagulation Disease a duplex venous doppler study was ordered. If fact one had been ordered on the day previous to her surgery and not carried out. In the recovery room orders were placed however the Venous noninvasive doppler lab was not able to answer their phone and it was not clear that her Study was to be done. Hypercoagulation lab were  ordered but then cancelled when the lab was Able to perform the bedside evaluation for DVT of the LEs. The doppler test was negative for DVT. She was able to demonstrate transition to standing and walking in the recovery room, She was tolerating the post operative discomfort with oral narcotics. She was discharged home from the Recovery room. All questions were answered. She is advised to take full strength aspirin 325 mg Twice a day with meal and start walking as soon as possible, up to 3 times a day to prevent DVT.  She given sequential compression devices, early ambulation, and chemoprophylaxis for DVT prophylaxis.  She benefited maximally from their hospital stay and there were no complications.    Recent vital signs:  Filed Vitals:   07/04/15 1645 07/04/15 1700  BP: 119/71 109/68  Pulse: 60 65  Temp: 98 F (36.7 C)   Resp: 11 8    Recent laboratory studies:  Results for orders placed or performed during the hospital encounter of 07/04/15  Surgical pcr screen  Result Value Ref Range   MRSA, PCR NEGATIVE NEGATIVE   Staphylococcus aureus NEGATIVE NEGATIVE  CBC  Result Value Ref Range   WBC 5.0 4.0 - 10.5 K/uL   RBC 4.17 3.87 - 5.11 MIL/uL   Hemoglobin 12.0 12.0 - 15.0 g/dL   HCT 78.435.6 (L) 69.636.0 - 29.546.0 %  MCV 85.4 78.0 - 100.0 fL   MCH 28.8 26.0 - 34.0 pg   MCHC 33.7 30.0 - 36.0 g/dL   RDW 16.1 09.6 - 04.5 %   Platelets 227 150 - 400 K/uL  Comprehensive metabolic panel  Result Value Ref Range   Sodium 145 135 - 145 mmol/L   Potassium 4.0 3.5 - 5.1 mmol/L   Chloride 110 101 - 111 mmol/L   CO2 24 22 - 32 mmol/L   Glucose, Bld 99 65 - 99 mg/dL   BUN 10 6 - 20 mg/dL   Creatinine, Ser 4.09 (H) 0.44 - 1.00 mg/dL   Calcium 9.7 8.9 - 81.1 mg/dL   Total Protein 7.5 6.5 - 8.1 g/dL   Albumin 4.1 3.5 - 5.0 g/dL   AST 30 15 - 41 U/L   ALT 24 14 - 54 U/L   Alkaline Phosphatase 91 38 - 126 U/L   Total Bilirubin 0.7 0.3 - 1.2 mg/dL   GFR calc non Af Amer >60 >60 mL/min   GFR  calc Af Amer >60 >60 mL/min   Anion gap 11 5 - 15    Discharge Medications:     Medication List    TAKE these medications        albuterol (2.5 MG/3ML) 0.083% nebulizer solution  Commonly known as:  PROVENTIL  Take 2.5 mg by nebulization every 4 (four) hours as needed for wheezing or shortness of breath.     PROVENTIL HFA 108 (90 Base) MCG/ACT inhaler  Generic drug:  albuterol  INHALE 2 PUFFS BY MOUTH INTO THE LUNGS EVERY 4 HOURS AS NEEDED FOR WHEEZING OR SHORTNESS OF BREATH     aspirin EC 325 MG tablet  Take 1 tablet (325 mg total) by mouth 2 (two) times daily after a meal.     Calcium-D 600-400 MG-UNIT Tabs  Take 1 tablet by mouth daily.     gabapentin 300 MG capsule  Commonly known as:  NEURONTIN  Take 300 mg by mouth 2 (two) times daily.     HYDROcodone-acetaminophen 5-325 MG tablet  Commonly known as:  NORCO/VICODIN  Take 1 tablet by mouth every 4 (four) hours as needed.     HYDROmorphone 2 MG tablet  Commonly known as:  DILAUDID  Take 1 tablet by mouth 2 (two) times daily as needed.     methocarbamol 500 MG tablet  Commonly known as:  ROBAXIN  Take 1 tablet (500 mg total) by mouth every 8 (eight) hours as needed for muscle spasms.     oxyCODONE-acetaminophen 5-325 MG tablet  Commonly known as:  PERCOCET/ROXICET  Take 1-2 tablets by mouth every 4 (four) hours as needed for severe pain.        Diagnostic Studies: Dg Lumbar Spine 2-3 Views  07/04/2015  CLINICAL DATA:  L5-S1 microdiskectomy EXAM: LUMBAR SPINE - 2-3 VIEW COMPARISON:  None. FINDINGS: Intraoperative localization x-rays. Image number 1: Metallic needle in the posterior soft tissues just posterior to the L5 spinous process. Image number 2: Metallic instrument posteriorly projecting between the L4-5 spinous processes. Image number 3: Posterior tissue retractors. Metallic probe with the tip projecting along the inferior aspect of the L5-S1 facet. IMPRESSION: Intraoperative localization x-rays as described  above. Electronically Signed   By: Elige Ko   On: 07/04/2015 15:34   Mr Lumbar Spine Wo Contrast  06/27/2015  CLINICAL DATA:  Two month history of low back and left leg pain worsened since a fall [place] [employer] on 06/08/2015. History of  prior lumbar surgery. EXAM: MRI LUMBAR SPINE WITHOUT CONTRAST TECHNIQUE: Multiplanar, multisequence MR imaging of the lumbar spine was performed. No intravenous contrast was administered. COMPARISON:  None. FINDINGS: Normal alignment of the lumbar vertebral bodies. They demonstrate normal marrow signal except for a benign L3 hemangioma and minimal endplate reactive changes at L4-5 the last full intervertebral disc space is labeled L5-S1 and the conus medullaris terminates at L1-2. The facets are normally aligned. No pars defects. No significant paraspinal or retroperitoneal findings. L1-2: No significant findings. L2-3:  No significant findings. L3-4: Mild annular bulge and moderate facet disease but no significant spinal, lateral recess or foraminal stenosis. L4-5: Focal central disc protrusion with mild mass effect on the ventral thecal sac and mild bilateral lateral recess encroachment. No foraminal stenosis. Moderate facet disease. L5-S1: Moderate-sized left paracentral disc protrusion with mass effect on the left side of thecal sac and on the left S1 nerve root. The exiting L5 nerve roots appear normal. Moderate facet disease. IMPRESSION: 1. Moderate-sized left paracentral disc protrusion at L5-S1 with mass effect on the left side of thecal sac and on the left S1 nerve root. This likely accounts for patient's left lower extremity radiculopathy. 2. Shallow broad-based disc protrusion at L4-5 with mild flattening of the ventral thecal sac and mild bilateral lateral recess encroachment. Electronically Signed   By: Rudie Meyer M.D.   On: 06/27/2015 16:34    Disposition: 01-Home or Self Care      Discharge Instructions    Call MD / Call 911    Complete  by:  As directed   If you experience chest pain or shortness of breath, CALL 911 and be transported to the hospital emergency room.  If you develope a fever above 101 F, pus (white drainage) or increased drainage or redness at the wound, or calf pain, call your surgeon's office.     Constipation Prevention    Complete by:  As directed   Drink plenty of fluids.  Prune juice may be helpful.  You may use a stool softener, such as Colace (over the counter) 100 mg twice a day.  Use MiraLax (over the counter) for constipation as needed.     Diet - low sodium heart healthy    Complete by:  As directed      Discharge instructions    Complete by:  As directed   No lifting greater than 10 lbs. Avoid bending, stooping and twisting. Walk in house for first week them may start to get out slowly increasing distance up to one mile by 3 weeks post op. Keep incision dry for 3 days, may use tegaderm or similar water impervious dressing.     Driving restrictions    Complete by:  As directed   No driving for 3 weeks     Increase activity slowly as tolerated    Complete by:  As directed      Lifting restrictions    Complete by:  As directed   No lifting for 6 weeks           Follow-up Information    Follow up with Kayman Snuffer E, MD In 4 weeks.   Specialty:  Orthopedic Surgery   Why:  For wound re-check   Contact information:   [address] [place] [address] [phone]        Signed: Kerrin Champagne 07/07/2015, 11:22 AM

## 2015-07-08 MED FILL — Thrombin For Soln 20000 Unit: CUTANEOUS | Qty: 1 | Status: AC

## 2015-07-09 NOTE — Anesthesia Postprocedure Evaluation (Signed)
Anesthesia Post Note  Patient: [name]  Procedure(s) Performed: Procedure(s) (LRB): Left L5-S1 MICRODISCECTOMY (N/A)  Patient location during evaluation: PACU Anesthesia Type: General Level of consciousness: awake and alert Pain management: pain level controlled Vital Signs Assessment: post-procedure vital signs reviewed and stable Respiratory status: spontaneous breathing Cardiovascular status: blood pressure returned to baseline Anesthetic complications: no    Last Vitals:  Filed Vitals:   07/04/15 1645 07/04/15 1700  BP: 119/71 109/68  Pulse: 60 65  Temp: 36.7 C   Resp: 11 8    Last Pain:  Filed Vitals:   07/07/15 1527  PainSc: 3                  Kennieth RadFitzgerald, Odel Schmid E

## 2015-08-26 ENCOUNTER — Other Ambulatory Visit: Payer: Self-pay | Admitting: Specialist

## 2015-08-26 DIAGNOSIS — M545 Low back pain: Secondary | ICD-10-CM

## 2015-08-29 ENCOUNTER — Ambulatory Visit
Admission: RE | Admit: 2015-08-29 | Discharge: 2015-08-29 | Disposition: A | Discharge status: Home / Self Care | Payer: PRIVATE HEALTH INSURANCE | Source: Ambulatory Visit | Attending: Specialist | Admitting: Specialist

## 2015-08-29 DIAGNOSIS — M545 Low back pain: Secondary | ICD-10-CM

## 2015-08-29 MED ORDER — GADOBENATE DIMEGLUMINE 529 MG/ML IV SOLN
15.0000 mL | Freq: Once | INTRAVENOUS | Status: AC | PRN
  Administered 2015-08-29: 15 mL via INTRAVENOUS

## 2015-09-04 ENCOUNTER — Other Ambulatory Visit: Payer: Self-pay

## 2015-09-16 ENCOUNTER — Ambulatory Visit: Payer: Self-pay | Admitting: Internal Medicine

## 2016-02-14 ENCOUNTER — Encounter (INDEPENDENT_AMBULATORY_CARE_PROVIDER_SITE_OTHER): Payer: Self-pay

## 2016-04-09 ENCOUNTER — Emergency Department (HOSPITAL_COMMUNITY)
Admission: EM | Admit: 2016-04-09 | Discharge: 2016-04-10 | Disposition: A | Discharge status: Home / Self Care | Payer: PRIVATE HEALTH INSURANCE | Attending: Emergency Medicine | Admitting: Emergency Medicine

## 2016-04-09 ENCOUNTER — Encounter (HOSPITAL_COMMUNITY): Payer: Self-pay | Admitting: Emergency Medicine

## 2016-04-09 DIAGNOSIS — Z7982 Long term (current) use of aspirin: Secondary | ICD-10-CM | POA: Insufficient documentation

## 2016-04-09 DIAGNOSIS — R6883 Chills (without fever): Secondary | ICD-10-CM | POA: Diagnosis present

## 2016-04-09 DIAGNOSIS — N3000 Acute cystitis without hematuria: Secondary | ICD-10-CM

## 2016-04-09 DIAGNOSIS — J45909 Unspecified asthma, uncomplicated: Secondary | ICD-10-CM | POA: Insufficient documentation

## 2016-04-09 DIAGNOSIS — N189 Chronic kidney disease, unspecified: Secondary | ICD-10-CM | POA: Insufficient documentation

## 2016-04-09 HISTORY — DX: Urinary tract infection, site not specified: N39.0

## 2016-04-09 LAB — COMPREHENSIVE METABOLIC PANEL
ALT: 93 U/L — ABNORMAL HIGH (ref 14–54)
ANION GAP: 9 (ref 5–15)
AST: 114 U/L — ABNORMAL HIGH (ref 15–41)
Albumin: 3.8 g/dL (ref 3.5–5.0)
Alkaline Phosphatase: 151 U/L — ABNORMAL HIGH (ref 38–126)
BUN: 20 mg/dL (ref 6–20)
CHLORIDE: 109 mmol/L (ref 101–111)
CO2: 22 mmol/L (ref 22–32)
Calcium: 9 mg/dL (ref 8.9–10.3)
Creatinine, Ser: 1.28 mg/dL — ABNORMAL HIGH (ref 0.44–1.00)
GFR calc non Af Amer: 47 mL/min — ABNORMAL LOW (ref >60)
GFR, EST AFRICAN AMERICAN: 54 mL/min — AB (ref >60)
Glucose, Bld: 86 mg/dL (ref 65–99)
POTASSIUM: 3.3 mmol/L — AB (ref 3.5–5.1)
SODIUM: 140 mmol/L (ref 135–145)
Total Bilirubin: 1.4 mg/dL — ABNORMAL HIGH (ref 0.3–1.2)
Total Protein: 6.6 g/dL (ref 6.5–8.1)

## 2016-04-09 LAB — URINE MICROSCOPIC-ADD ON

## 2016-04-09 LAB — CBC WITH DIFFERENTIAL/PLATELET
Basophils Absolute: 0 10*3/uL (ref 0.0–0.1)
Basophils Relative: 0 %
EOS ABS: 0 10*3/uL (ref 0.0–0.7)
EOS PCT: 0 %
HCT: 32.9 % — ABNORMAL LOW (ref 36.0–46.0)
Hemoglobin: 11.5 g/dL — ABNORMAL LOW (ref 12.0–15.0)
LYMPHS ABS: 0.6 10*3/uL — AB (ref 0.7–4.0)
Lymphocytes Relative: 6 %
MCH: 28.4 pg (ref 26.0–34.0)
MCHC: 35 g/dL (ref 30.0–36.0)
MCV: 81.2 fL (ref 78.0–100.0)
MONOS PCT: 2 %
Monocytes Absolute: 0.2 10*3/uL (ref 0.1–1.0)
Neutro Abs: 9.5 10*3/uL — ABNORMAL HIGH (ref 1.7–7.7)
Neutrophils Relative %: 92 %
PLATELETS: 186 10*3/uL (ref 150–400)
RBC: 4.05 MIL/uL (ref 3.87–5.11)
RDW: 13.8 % (ref 11.5–15.5)
WBC: 10.3 10*3/uL (ref 4.0–10.5)

## 2016-04-09 LAB — POC URINE PREG, ED: PREG TEST UR: NEGATIVE

## 2016-04-09 LAB — URINALYSIS, ROUTINE W REFLEX MICROSCOPIC
BILIRUBIN URINE: NEGATIVE
GLUCOSE, UA: NEGATIVE mg/dL
Ketones, ur: NEGATIVE mg/dL
NITRITE: POSITIVE — AB
PROTEIN: 30 mg/dL — AB
SPECIFIC GRAVITY, URINE: 1.015 (ref 1.005–1.030)
pH: 6 (ref 5.0–8.0)

## 2016-04-09 NOTE — ED Triage Notes (Signed)
Pt. reports generalized body aches with chills , mild dysuria and cloudy urine this week , seen at an urgent care today advised to go to ER for further evaluation .

## 2016-04-09 NOTE — ED Provider Notes (Signed)
[facility]-EMERGENCY DEPT Provider Note   CSN: [id number] Arrival date & time: 04/09/16  2000   By signing my name below, I, [name], attest that this documentation has been prepared under the direction and in the presence of Carol Lyons, MD. Electronically signed, [name], ED Scribe. 04/09/16. 11:36 PM.   History   Chief Complaint Chief Complaint  Patient presents with  . Generalized Body Aches  . Urinary Tract Infection  The history is provided by the patient. No language interpreter was used.   HPI Comments: [name] is a 55 y.o. female with PMHx of UTI who presents to the Emergency Department complaining of gradual worsening chills and body aches x 2 days. She reports associated cloudy urine,  intermittent lower back pain, lower abdominal pain, and intermittent episodes of nausea accompanied by hot flashes.She states her current symptoms are different from her UTI symptoms. The pt denies cough or fever. She states she was previously prescribed macrobid for a previous UTI diagnosis.     Past Medical History:  Diagnosis Date  . Anxiety   . Asthma   . Chronic kidney disease   . Complication of anesthesia    was slow to wake up with one of her surgeries  . Cough   . Headache    hx. migraines  . Hyperventilation   . Insomnia   . Pleurisy   . UTI (urinary tract infection)     Patient Active Problem List   Diagnosis Date Noted  . HNP (herniated nucleus pulposus), lumbar 07/04/2015    Class: Acute  . Anxiety state, unspecified 08/08/2013  . INSOMNIA 05/20/2008  . Asthma with bronchitis 05/15/2007  . PLEURISY 05/15/2007  . HYPERVENTILATION 05/15/2007  . COUGH 05/15/2007    Past Surgical History:  Procedure Laterality Date  . LUMBAR LAMINECTOMY N/A 07/04/2015   Procedure: Left L5-S1 MICRODISCECTOMY;  Surgeon: Kerrin Champagne, MD;  Location[facility];  Service: Orthopedics;  Laterality: N/A;  . LUMBAR SPINE SURGERY    . UTERINE FIBROID SURGERY    .  VESICOVAGINAL FISTULA CLOSURE W/ TAH      OB History    No data available       Home Medications    Prior to Admission medications   Medication Sig Start Date End Date Taking? Authorizing Provider  albuterol (PROVENTIL) (2.5 MG/3ML) 0.083% nebulizer solution Take 2.5 mg by nebulization every 4 (four) hours as needed for wheezing or shortness of breath.    Historical Provider, MD  aspirin EC 325 MG tablet Take 1 tablet (325 mg total) by mouth 2 (two) times daily after a meal. 07/04/15   Kerrin Champagne, MD  Calcium Carbonate-Vitamin D (CALCIUM-D) 600-400 MG-UNIT TABS Take 1 tablet by mouth daily.    Historical Provider, MD  gabapentin (NEURONTIN) 300 MG capsule Take 300 mg by mouth 2 (two) times daily.    Historical Provider, MD  HYDROcodone-acetaminophen (NORCO/VICODIN) 5-325 MG per tablet Take 1 tablet by mouth every 4 (four) hours as needed. 08/20/13   Rolland Porter, MD  HYDROmorphone (DILAUDID) 2 MG tablet Take 1 tablet by mouth 2 (two) times daily as needed. 07/01/15   Historical Provider, MD  methocarbamol (ROBAXIN) 500 MG tablet Take 1 tablet (500 mg total) by mouth every 8 (eight) hours as needed for muscle spasms. 07/04/15   Kerrin Champagne, MD  oxyCODONE-acetaminophen (PERCOCET/ROXICET) 5-325 MG tablet Take 1-2 tablets by mouth every 4 (four) hours as needed for severe pain. 07/04/15   Kerrin Champagne, MD  PROVENTIL HFA 108 (90 BASE) MCG/ACT inhaler INHALE 2 PUFFS BY MOUTH INTO THE LUNGS EVERY 4 HOURS AS NEEDED FOR WHEEZING OR SHORTNESS OF BREATH 06/09/15   Waymon Budgelinton D Young, MD    Family History Family History  Problem Relation Age of Onset  . Prostate cancer Father     Social History Social History  Substance Use Topics  . Smoking status: Never Smoker  . Smokeless tobacco: Never Used  . Alcohol use No     Allergies   Doxycycline; Metoclopramide hcl; and Prochlorperazine edisylate   Review of Systems Review of Systems  Constitutional: Positive for chills. Negative for fever.    Gastrointestinal: Positive for nausea.  Genitourinary: Negative for difficulty urinating and dysuria.  All other systems reviewed and are negative.    Physical Exam Updated Vital Signs BP (!) 85/51 Comment: RN notified   Pulse 90   Temp 98.8 F (37.1 C) (Oral)   Resp 16   Ht 5\' 5"  (1.651 m)   Wt 172 lb (78 kg)   SpO2 98%   BMI 28.62 kg/m   Physical Exam  Constitutional: She is oriented to person, place, and time. She appears well-developed and well-nourished. No distress.  HENT:  Head: Normocephalic and atraumatic.  Eyes: EOM are normal.  Neck: Normal range of motion.  Cardiovascular: Normal rate, regular rhythm and normal heart sounds.   Pulmonary/Chest: Effort normal and breath sounds normal.  Abdominal: Soft. She exhibits no distension. There is tenderness. There is no rebound and no guarding.  Mild TTP across the lower abdomen  Musculoskeletal: Normal range of motion.  Neurological: She is alert and oriented to person, place, and time.  Skin: Skin is warm and dry.  Psychiatric: She has a normal mood and affect. Judgment normal.  Nursing note and vitals reviewed.    ED Treatments / Results  DIAGNOSTIC STUDIES: Oxygen Saturation is 98% on RA, normal by my interpretation.    COORDINATION OF CARE: 11:37 PM Will order labs and antibiotics. Discussed treatment plan with pt at bedside and pt agreed to plan.   Labs (all labs ordered are listed, but only abnormal results are displayed) Labs Reviewed  CBC WITH DIFFERENTIAL/PLATELET - Abnormal; Notable for the following:       Result Value   Hemoglobin 11.5 (*)    HCT 32.9 (*)    Neutro Abs 9.5 (*)    Lymphs Abs 0.6 (*)    All other components within normal limits  COMPREHENSIVE METABOLIC PANEL - Abnormal; Notable for the following:    Potassium 3.3 (*)    Creatinine, Ser 1.28 (*)    AST 114 (*)    ALT 93 (*)    Alkaline Phosphatase 151 (*)    Total Bilirubin 1.4 (*)    GFR calc non Af Amer 47 (*)    GFR calc  Af Amer 54 (*)    All other components within normal limits  URINALYSIS, ROUTINE W REFLEX MICROSCOPIC (NOT AT [facility]) - Abnormal; Notable for the following:    Color, Urine AMBER (*)    APPearance HAZY (*)    Hgb urine dipstick LARGE (*)    Protein, ur 30 (*)    Nitrite POSITIVE (*)    Leukocytes, UA LARGE (*)    All other components within normal limits  URINE MICROSCOPIC-ADD ON - Abnormal; Notable for the following:    Squamous Epithelial / LPF 0-5 (*)    Bacteria, UA FEW (*)    All other components within normal limits  POC URINE PREG, ED    EKG  EKG Interpretation None       Radiology No results found.  Procedures Procedures (including critical care time)  Medications Ordered in ED Medications - No data to display   Initial Impression / Assessment and Plan / ED Course  I have reviewed the triage vital signs and the nursing notes.  Pertinent labs & imaging results that were available during my care of the patient were reviewed by me and considered in my medical decision making (see chart for details).  Clinical Course    UA reveals a urinary tract infection. She will be treated with Rocephin and Keflex and discharged to home. A urine culture was obtained due to her lack of response to Macrobid. She is to return as needed if things worsen.  Final Clinical Impressions(s) / ED Diagnoses   Final diagnoses:  None    New Prescriptions New Prescriptions   No medications on file     I personally performed the services described in this documentation, which was scribed in my presence. The recorded information has been reviewed and is accurate.        Carol Lyons, MD 04/10/16 [phone]

## 2016-04-10 MED ORDER — CEFTRIAXONE SODIUM 1 G IJ SOLR
1.0000 g | Freq: Once | INTRAMUSCULAR | Status: AC
  Administered 2016-04-10: 1 g via INTRAMUSCULAR
  Filled 2016-04-10: qty 10

## 2016-04-10 MED ORDER — CEPHALEXIN 500 MG PO CAPS: 500.0000 mg | ORAL_CAPSULE | Freq: Four times a day (QID) | ORAL | 0 refills | Status: DC

## 2016-04-10 MED ORDER — LIDOCAINE HCL (PF) 1 % IJ SOLN
INTRAMUSCULAR | Status: AC
  Administered 2016-04-10: 2.1 mL
  Filled 2016-04-10: qty 5

## 2016-04-10 NOTE — Discharge Instructions (Signed)
Keflex as prescribed.  Return to the emergency department if you develop high fevers, vomiting, or other new and concerning symptoms.

## 2016-04-12 LAB — URINE CULTURE: Culture: >=100000 — AB

## 2016-04-13 ENCOUNTER — Telehealth (HOSPITAL_BASED_OUTPATIENT_CLINIC_OR_DEPARTMENT_OTHER): Payer: Self-pay | Admitting: Emergency Medicine

## 2016-04-13 NOTE — Telephone Encounter (Signed)
Post ED Visit - Positive Culture Follow-up  Culture report reviewed by antimicrobial stewardship pharmacist:  []  Enzo BiNathan Batchelder, Pharm.D. []  Celedonio MiyamotoJeremy Frens, Pharm.D., BCPS []  Garvin FilaMike Maccia, Pharm.D. []  Georgina PillionElizabeth Martin, Pharm.D., BCPS []  [place]h Pham, [address]Ph[address]D., BCPS, AAHIVP []  Estella HuskMichelle Turner, Pharm.D., BCPS, AAHIVP []  Tennis Mustassie Stewart, [address]Pharm[address]D. []  Sherle Poeob Vincent, [address]Ph[address] Mackie Paienee Ackley PharmD  Positive urine culture Treated with cephalexin, organism sensitive to the same and no further patient follow-up is required at this time.  Berle MullMiller, Alasia Enge 04/13/2016, 10:06 AM

## 2016-04-14 ENCOUNTER — Other Ambulatory Visit: Payer: Self-pay | Admitting: Internal Medicine

## 2016-04-14 ENCOUNTER — Encounter (HOSPITAL_COMMUNITY): Payer: Self-pay | Admitting: *Deleted

## 2016-04-14 ENCOUNTER — Inpatient Hospital Stay (HOSPITAL_COMMUNITY)
Admission: EM | Admit: 2016-04-14 | Discharge: 2016-04-20 | DRG: 690 | Disposition: A | Discharge status: Home / Self Care | Payer: PRIVATE HEALTH INSURANCE | Attending: Internal Medicine | Admitting: Internal Medicine

## 2016-04-14 DIAGNOSIS — N10 Acute tubulo-interstitial nephritis: Principal | ICD-10-CM | POA: Diagnosis present

## 2016-04-14 DIAGNOSIS — R0602 Shortness of breath: Secondary | ICD-10-CM

## 2016-04-14 DIAGNOSIS — R05 Cough: Secondary | ICD-10-CM

## 2016-04-14 DIAGNOSIS — J45909 Unspecified asthma, uncomplicated: Secondary | ICD-10-CM | POA: Diagnosis present

## 2016-04-14 DIAGNOSIS — R945 Abnormal results of liver function studies: Secondary | ICD-10-CM

## 2016-04-14 DIAGNOSIS — N189 Chronic kidney disease, unspecified: Secondary | ICD-10-CM | POA: Diagnosis present

## 2016-04-14 DIAGNOSIS — R109 Unspecified abdominal pain: Secondary | ICD-10-CM | POA: Diagnosis not present

## 2016-04-14 DIAGNOSIS — R7989 Other specified abnormal findings of blood chemistry: Secondary | ICD-10-CM | POA: Diagnosis present

## 2016-04-14 DIAGNOSIS — R059 Cough, unspecified: Secondary | ICD-10-CM

## 2016-04-14 DIAGNOSIS — D649 Anemia, unspecified: Secondary | ICD-10-CM | POA: Diagnosis present

## 2016-04-14 DIAGNOSIS — E860 Dehydration: Secondary | ICD-10-CM | POA: Diagnosis present

## 2016-04-14 DIAGNOSIS — N12 Tubulo-interstitial nephritis, not specified as acute or chronic: Secondary | ICD-10-CM

## 2016-04-14 LAB — URINALYSIS, ROUTINE W REFLEX MICROSCOPIC
BILIRUBIN URINE: NEGATIVE
Glucose, UA: NEGATIVE mg/dL
Ketones, ur: NEGATIVE mg/dL
NITRITE: NEGATIVE
PROTEIN: NEGATIVE mg/dL
SPECIFIC GRAVITY, URINE: 1.019 (ref 1.005–1.030)
pH: 5.5 (ref 5.0–8.0)

## 2016-04-14 LAB — BASIC METABOLIC PANEL
ANION GAP: 8 (ref 5–15)
BUN: 11 mg/dL (ref 6–20)
CHLORIDE: 109 mmol/L (ref 101–111)
CO2: 26 mmol/L (ref 22–32)
Calcium: 9.2 mg/dL (ref 8.9–10.3)
Creatinine, Ser: 1.11 mg/dL — ABNORMAL HIGH (ref 0.44–1.00)
GFR, EST NON AFRICAN AMERICAN: 55 mL/min — AB (ref >60)
Glucose, Bld: 99 mg/dL (ref 65–99)
POTASSIUM: 4 mmol/L (ref 3.5–5.1)
SODIUM: 143 mmol/L (ref 135–145)

## 2016-04-14 LAB — CBC WITH DIFFERENTIAL/PLATELET
BASOS ABS: 0 10*3/uL (ref 0.0–0.1)
Basophils Relative: 0 %
EOS ABS: 0.1 10*3/uL (ref 0.0–0.7)
Eosinophils Relative: 2 %
HCT: 31.3 % — ABNORMAL LOW (ref 36.0–46.0)
HEMOGLOBIN: 10.9 g/dL — AB (ref 12.0–15.0)
LYMPHS ABS: 2.5 10*3/uL (ref 0.7–4.0)
LYMPHS PCT: 39 %
MCH: 28 pg (ref 26.0–34.0)
MCHC: 34.8 g/dL (ref 30.0–36.0)
MCV: 80.5 fL (ref 78.0–100.0)
Monocytes Absolute: 0.7 10*3/uL (ref 0.1–1.0)
Monocytes Relative: 10 %
NEUTROS PCT: 49 %
Neutro Abs: 3.1 10*3/uL (ref 1.7–7.7)
PLATELETS: 278 10*3/uL (ref 150–400)
RBC: 3.89 MIL/uL (ref 3.87–5.11)
RDW: 14.2 % (ref 11.5–15.5)
WBC: 6.4 10*3/uL (ref 4.0–10.5)

## 2016-04-14 LAB — URINE MICROSCOPIC-ADD ON

## 2016-04-14 MED ORDER — SODIUM CHLORIDE 0.9 % IV BOLUS (SEPSIS)
500.0000 mL | Freq: Once | INTRAVENOUS | Status: AC
  Administered 2016-04-15: 500 mL via INTRAVENOUS

## 2016-04-14 MED ORDER — ONDANSETRON HCL 4 MG/2ML IJ SOLN
4.0000 mg | Freq: Once | INTRAMUSCULAR | Status: AC
  Administered 2016-04-15: 4 mg via INTRAVENOUS
  Filled 2016-04-14: qty 2

## 2016-04-14 MED ORDER — HYDROMORPHONE HCL 1 MG/ML IJ SOLN
1.0000 mg | Freq: Once | INTRAMUSCULAR | Status: AC
  Administered 2016-04-15: 1 mg via INTRAVENOUS
  Filled 2016-04-14: qty 1

## 2016-04-14 NOTE — ED Provider Notes (Signed)
[facility]-EMERGENCY DEPT Provider Note   CSN: [id number] Arrival date & time: 04/14/16  1638  By signing my name below, I, Linna DarnerRussell Turner, attest that this documentation has been prepared under the direction and in the presence of physician practitioner, Gilda Creasehristopher J Pollina, MD. Electronically Signed: Linna Darnerussell Turner, Scribe. 04/14/2016. 11:28 PM.  History   Chief Complaint Chief Complaint  Patient presents with  . Abdominal Pain    The history is provided by the patient. No language interpreter was used.     HPI Comments: [name] is a 55 y.o. female with PMHx significant for CKD and UTI who presents to the Emergency Department complaining of constant, severe, lower abdominal pain for the last 3 weeks. Pt states her pain is most significant on the right side of her lower abdomen. She notes associated hematuria. Pt reports [facility] was [facility] ago and was diagnosed with a UTI; she was prescribed Cephalexin with no relief of her symptoms. She states she saw her OB/GYN today for her symptoms.She notes a PSHx of abdominal hysterectomy. Pt reports cough and voice change (hoarse) over the last few days as well. She states it feels like her throat is closing. Pt denies back pain, flank pain, dysuria, SOB, or any other associated symptoms.  Past Medical History:  Diagnosis Date  . Anxiety   . Asthma   . Chronic kidney disease   . Complication of anesthesia    was slow to wake up with one of her surgeries  . Cough   . Headache    hx. migraines  . Hyperventilation   . Insomnia   . Pleurisy   . UTI (urinary tract infection)     Patient Active Problem List   Diagnosis Date Noted  . HNP (herniated nucleus pulposus), lumbar 07/04/2015    Class: Acute  . Anxiety state, unspecified 08/08/2013  . INSOMNIA 05/20/2008  . Asthma with bronchitis 05/15/2007  . PLEURISY 05/15/2007  . HYPERVENTILATION 05/15/2007  . COUGH 05/15/2007    Past Surgical History:    Procedure Laterality Date  . LUMBAR LAMINECTOMY N/A 07/04/2015   Procedure: Left L5-S1 MICRODISCECTOMY;  Surgeon: Kerrin ChampagneJames E Nitka, MD;  [facility];  Service: Orthopedics;  Laterality: N/A;  . LUMBAR SPINE SURGERY    . UTERINE FIBROID SURGERY    . VESICOVAGINAL FISTULA CLOSURE W/ TAH      OB History    No data available       Home Medications    Prior to Admission medications   Medication Sig Start Date End Date Taking? Authorizing Provider  albuterol (PROVENTIL) (2.5 MG/3ML) 0.083% nebulizer solution Take 2.5 mg by nebulization every 4 (four) hours as needed for wheezing or shortness of breath.   Yes Historical Provider, MD  cephALEXin (KEFLEX) 500 MG capsule Take 1 capsule (500 mg total) by mouth 4 (four) times daily. 04/10/16  Yes Geoffery Lyonsouglas Delo, MD  PROVENTIL HFA 108 (90 BASE) MCG/ACT inhaler INHALE 2 PUFFS BY MOUTH INTO THE LUNGS EVERY 4 HOURS AS NEEDED FOR WHEEZING OR SHORTNESS OF BREATH 06/09/15  Yes Waymon Budgelinton D Young, MD  aspirin EC 325 MG tablet Take 1 tablet (325 mg total) by mouth 2 (two) times daily after a meal. Patient not taking: Reported on 04/14/2016 07/04/15   Kerrin ChampagneJames E Nitka, MD  HYDROcodone-acetaminophen (NORCO/VICODIN) 5-325 MG per tablet Take 1 tablet by mouth every 4 (four) hours as needed. Patient not taking: Reported on 04/14/2016 08/20/13   Rolland PorterMark James, MD  methocarbamol (  ROBAXIN) 500 MG tablet Take 1 tablet (500 mg total) by mouth every 8 (eight) hours as needed for muscle spasms. Patient not taking: Reported on 04/14/2016 07/04/15   Kerrin Champagne, MD  oxyCODONE-acetaminophen (PERCOCET/ROXICET) 5-325 MG tablet Take 1-2 tablets by mouth every 4 (four) hours as needed for severe pain. Patient not taking: Reported on 04/14/2016 07/04/15   Kerrin Champagne, MD    Family History Family History  Problem Relation Age of Onset  . Prostate cancer Father     Social History Social History  Substance Use Topics  . Smoking status: Never Smoker  . Smokeless tobacco: Never Used   . Alcohol use No     Allergies   Doxycycline; Metoclopramide hcl; and Prochlorperazine edisylate   Review of Systems Review of Systems  HENT: Positive for voice change (hoarse).   Respiratory: Positive for cough. Negative for shortness of breath.   Gastrointestinal: Positive for abdominal pain (lower).  Genitourinary: Positive for hematuria. Negative for dysuria and flank pain.  Musculoskeletal: Negative for back pain.  All other systems reviewed and are negative.   Physical Exam Updated Vital Signs BP 123/77 (BP Location: Left Arm)   Pulse 63   Temp 97.8 F (36.6 C) (Oral)   Resp 20   Ht 5\' 5"  (1.651 m)   Wt 172 lb 6 oz (78.2 kg)   SpO2 100%   BMI 28.68 kg/m   Physical Exam  Constitutional: She is oriented to person, place, and time. She appears well-developed and well-nourished. No distress.  HENT:  Head: Normocephalic and atraumatic.  Right Ear: Hearing normal.  Left Ear: Hearing normal.  Nose: Nose normal.  Mouth/Throat: Oropharynx is clear and moist and mucous membranes are normal.  Eyes: Conjunctivae and EOM are normal. Pupils are equal, round, and reactive to light.  Neck: Normal range of motion. Neck supple.  Cardiovascular: Regular rhythm, S1 normal and S2 normal.  Exam reveals no gallop and no friction rub.   No murmur heard. Pulmonary/Chest: Effort normal and breath sounds normal. No respiratory distress. She exhibits no tenderness.  Abdominal: Soft. Normal appearance and bowel sounds are normal. There is no hepatosplenomegaly. There is tenderness. There is no rebound, no guarding, no tenderness at McBurney's point and negative Murphy's sign. No hernia.  Lower abdominal tenderness. No guarding or rebound.  Musculoskeletal: Normal range of motion.  Neurological: She is alert and oriented to person, place, and time. She has normal strength. No cranial nerve deficit or sensory deficit. Coordination normal. GCS eye subscore is 4. GCS verbal subscore is 5. GCS  motor subscore is 6.  Skin: Skin is warm, dry and intact. No rash noted. No cyanosis.  Psychiatric: She has a normal mood and affect. Her speech is normal and behavior is normal. Thought content normal.  Nursing note and vitals reviewed.   ED Treatments / Results  Labs (all labs ordered are listed, but only abnormal results are displayed) Labs Reviewed  URINALYSIS, ROUTINE W REFLEX MICROSCOPIC (NOT AT [facility]) - Abnormal; Notable for the following:       Result Value   APPearance CLOUDY (*)    Hgb urine dipstick MODERATE (*)    Leukocytes, UA SMALL (*)    All other components within normal limits  CBC WITH DIFFERENTIAL/PLATELET - Abnormal; Notable for the following:    Hemoglobin 10.9 (*)    HCT 31.3 (*)    All other components within normal limits  BASIC METABOLIC PANEL - Abnormal; Notable for the following:  Creatinine, Ser 1.11 (*)    GFR calc non Af Amer 55 (*)    All other components within normal limits  URINE MICROSCOPIC-ADD ON - Abnormal; Notable for the following:    Squamous Epithelial / LPF 6-30 (*)    Bacteria, UA MANY (*)    All other components within normal limits    EKG  EKG Interpretation None       Radiology Ct Abdomen Pelvis W Contrast  Result Date: 04/15/2016 CLINICAL DATA:  Acute onset of severe lower abdominal pain, worse on the right. Gross hematuria. Initial encounter. EXAM: CT ABDOMEN AND PELVIS WITH CONTRAST TECHNIQUE: Multidetector CT imaging of the abdomen and pelvis was performed using the standard protocol following bolus administration of intravenous contrast. CONTRAST:  ISOVUE-300 IOPAMIDOL (ISOVUE-300) INJECTION 61% COMPARISON:  None. FINDINGS: Lower chest: Minimal bibasilar atelectasis is noted. Mild bibasilar bronchiectasis is seen. Hepatobiliary: The liver is unremarkable in appearance. The gallbladder is unremarkable in appearance. The common bile duct remains normal in caliber. Pancreas: The pancreas is within normal limits. Spleen:  The spleen is unremarkable in appearance. Adrenals/Urinary Tract: The adrenal glands are unremarkable in appearance. Multiple areas of mildly decreased enhancement are noted at the right kidney, compatible with multifocal right-sided pyelonephritis. There is no evidence of hydronephrosis. No renal or ureteral stones are identified. No perinephric stranding is seen. Stomach/Bowel: The stomach is unremarkable in appearance. The small bowel is within normal limits. The appendix is normal in caliber, without evidence of appendicitis. The colon is unremarkable in appearance Scattered diverticulosis is noted along the descending and proximal sigmoid colon, without evidence of diverticulitis. Vascular/Lymphatic: The abdominal aorta is unremarkable in appearance. The inferior vena cava is grossly unremarkable. No retroperitoneal lymphadenopathy is seen. No pelvic sidewall lymphadenopathy is identified. Reproductive: The bladder is mildly distended and grossly unremarkable. The patient is status post hysterectomy. No suspicious adnexal masses are seen. Other: No additional soft tissue abnormalities are seen. Musculoskeletal: No acute osseous abnormalities are identified. Mild disc space narrowing and vacuum phenomenon are noted at L4-L5. The visualized musculature is unremarkable in appearance. IMPRESSION: 1. Multiple areas of decreased enhancement at the right kidney, suspicious for multifocal right-sided pyelonephritis. 2. Mild bibasilar bronchiectasis noted. 3. Scattered diverticulosis along the descending and proximal sigmoid colon, without evidence of diverticulitis. Electronically Signed   By: Roanna Raider M.D.   On: 04/15/2016 02:09    Procedures Procedures (including critical care time)  DIAGNOSTIC STUDIES: Oxygen Saturation is 100% on RA, normal by my interpretation.    COORDINATION OF CARE: 11:34 PM Discussed treatment plan with pt at bedside and pt agreed to plan.  Medications Ordered in  ED Medications  sodium chloride 0.9 % bolus 500 mL (500 mLs Intravenous New Bag/Given 04/15/16 0020)  HYDROmorphone (DILAUDID) injection 1 mg (1 mg Intravenous Given 04/15/16 0020)  ondansetron (ZOFRAN) injection 4 mg (4 mg Intravenous Given 04/15/16 0020)  iopamidol (ISOVUE-300) 61 % injection 100 mL (100 mLs Intravenous Contrast Given 04/15/16 0131)     Initial Impression / Assessment and Plan / ED Course  I have reviewed the triage vital signs and the nursing notes.  Pertinent labs & imaging results that were available during my care of the patient were reviewed by me and considered in my medical decision making (see chart for details).  Clinical Course   Patient presents with complaints of abdominal pain, hematuria, urinary frequency and urgency. Patient was initially diagnosed with urinary tract infection and started on Macrobid by her primary doctor. She was seen  in the ER 5 days ago for persistent symptoms, change to Keflex after being given a dose of Rocephin at that time. Patient's culture ultimately shows Escherichia coli sensitive to Rocephin and Keflex.  Despite this, however, patient continues to have symptoms. CT scan concerning for multifocal pyelonephritis. She is on appropriate outpatient therapy without improvement. Patient will require hospitalization for further management.  I personally performed the services described in this documentation, which was scribed in my presence. The recorded information has been reviewed and is accurate.   Final Clinical Impressions(s) / ED Diagnoses   Final diagnoses:  Pyelonephritis    New Prescriptions New Prescriptions   No medications on file     Gilda Crease, MD 04/15/16 0225

## 2016-04-14 NOTE — ED Triage Notes (Addendum)
Patient c/o suprapubic pain, hematuria, urinary frequency/urgency and dysuria x3 weeks.    [facility] and dx with UTI.  She saw her OB/GYN today because, although the body aches and pains are better, she still feels bad.  Patient c/o low abdominal pain and hematuria with urinary frequency/urgency that has improved since last week, but is still present.  She denies dysuria.  She has vaginal discharge from a yeast infection she developed while taking abx.  Patient has taken Macrobid and is now on Keflex.  Patient denies N/V and fever currently.    Patient also c/o intermittent headache x3 weeks.

## 2016-04-14 NOTE — ED Notes (Signed)
MD at bedside. 

## 2016-04-15 ENCOUNTER — Encounter (HOSPITAL_COMMUNITY): Payer: Self-pay

## 2016-04-15 ENCOUNTER — Emergency Department (HOSPITAL_COMMUNITY): Payer: PRIVATE HEALTH INSURANCE

## 2016-04-15 DIAGNOSIS — R109 Unspecified abdominal pain: Secondary | ICD-10-CM | POA: Diagnosis present

## 2016-04-15 DIAGNOSIS — E860 Dehydration: Secondary | ICD-10-CM | POA: Diagnosis present

## 2016-04-15 DIAGNOSIS — N12 Tubulo-interstitial nephritis, not specified as acute or chronic: Secondary | ICD-10-CM

## 2016-04-15 DIAGNOSIS — R7989 Other specified abnormal findings of blood chemistry: Secondary | ICD-10-CM | POA: Diagnosis not present

## 2016-04-15 DIAGNOSIS — N189 Chronic kidney disease, unspecified: Secondary | ICD-10-CM | POA: Diagnosis present

## 2016-04-15 DIAGNOSIS — D649 Anemia, unspecified: Secondary | ICD-10-CM

## 2016-04-15 DIAGNOSIS — R945 Abnormal results of liver function studies: Secondary | ICD-10-CM

## 2016-04-15 DIAGNOSIS — J45909 Unspecified asthma, uncomplicated: Secondary | ICD-10-CM | POA: Diagnosis present

## 2016-04-15 DIAGNOSIS — N10 Acute tubulo-interstitial nephritis: Secondary | ICD-10-CM | POA: Diagnosis present

## 2016-04-15 DIAGNOSIS — R0602 Shortness of breath: Secondary | ICD-10-CM | POA: Diagnosis not present

## 2016-04-15 DIAGNOSIS — R05 Cough: Secondary | ICD-10-CM | POA: Diagnosis not present

## 2016-04-15 LAB — HEPATIC FUNCTION PANEL
ALBUMIN: 3.3 g/dL — AB (ref 3.5–5.0)
ALT: 61 U/L — AB (ref 14–54)
AST: 31 U/L (ref 15–41)
Alkaline Phosphatase: 316 U/L — ABNORMAL HIGH (ref 38–126)
BILIRUBIN DIRECT: 0.1 mg/dL (ref 0.1–0.5)
BILIRUBIN TOTAL: 0.7 mg/dL (ref 0.3–1.2)
Indirect Bilirubin: 0.6 mg/dL (ref 0.3–0.9)
Total Protein: 7.2 g/dL (ref 6.5–8.1)

## 2016-04-15 LAB — BASIC METABOLIC PANEL
ANION GAP: 7 (ref 5–15)
BUN: 10 mg/dL (ref 6–20)
CALCIUM: 9.2 mg/dL (ref 8.9–10.3)
CHLORIDE: 110 mmol/L (ref 101–111)
CO2: 26 mmol/L (ref 22–32)
Creatinine, Ser: 1.05 mg/dL — ABNORMAL HIGH (ref 0.44–1.00)
GFR calc non Af Amer: 59 mL/min — ABNORMAL LOW (ref >60)
GLUCOSE: 92 mg/dL (ref 65–99)
Potassium: 4.1 mmol/L (ref 3.5–5.1)
Sodium: 143 mmol/L (ref 135–145)

## 2016-04-15 LAB — CBC WITH DIFFERENTIAL/PLATELET
Basophils Absolute: 0 10*3/uL (ref 0.0–0.1)
Basophils Relative: 0 %
Eosinophils Absolute: 0.2 10*3/uL (ref 0.0–0.7)
Eosinophils Relative: 3 %
HEMATOCRIT: 32.7 % — AB (ref 36.0–46.0)
HEMOGLOBIN: 10.9 g/dL — AB (ref 12.0–15.0)
LYMPHS ABS: 1.9 10*3/uL (ref 0.7–4.0)
Lymphocytes Relative: 31 %
MCH: 28.1 pg (ref 26.0–34.0)
MCHC: 33.3 g/dL (ref 30.0–36.0)
MCV: 84.3 fL (ref 78.0–100.0)
MONO ABS: 0.6 10*3/uL (ref 0.1–1.0)
MONOS PCT: 10 %
NEUTROS ABS: 3.4 10*3/uL (ref 1.7–7.7)
NEUTROS PCT: 56 %
Platelets: 275 10*3/uL (ref 150–400)
RBC: 3.88 MIL/uL (ref 3.87–5.11)
RDW: 14.5 % (ref 11.5–15.5)
WBC: 6 10*3/uL (ref 4.0–10.5)

## 2016-04-15 MED ORDER — TRAMADOL HCL 50 MG PO TABS: 50.0000 mg | ORAL_TABLET | Freq: Four times a day (QID) | ORAL | Status: DC | PRN

## 2016-04-15 MED ORDER — MORPHINE SULFATE 15 MG PO TABS
15.0000 mg | ORAL_TABLET | ORAL | Status: DC | PRN
  Administered 2016-04-15 – 2016-04-16 (×2): 15 mg via ORAL
  Filled 2016-04-15 (×2): qty 1

## 2016-04-15 MED ORDER — SODIUM CHLORIDE 0.9 % IV SOLN
500.0000 mg | Freq: Four times a day (QID) | INTRAVENOUS | Status: DC
  Administered 2016-04-15 – 2016-04-16 (×6): 500 mg via INTRAVENOUS
  Filled 2016-04-15 (×6): qty 500

## 2016-04-15 MED ORDER — SODIUM CHLORIDE 0.9 % IV BOLUS (SEPSIS)
1000.0000 mL | Freq: Once | INTRAVENOUS | Status: AC
  Administered 2016-04-15: 1000 mL via INTRAVENOUS

## 2016-04-15 MED ORDER — ALBUTEROL SULFATE (2.5 MG/3ML) 0.083% IN NEBU: 2.5000 mg | INHALATION_SOLUTION | RESPIRATORY_TRACT | Status: DC | PRN

## 2016-04-15 MED ORDER — TRAMADOL HCL 50 MG PO TABS
50.0000 mg | ORAL_TABLET | Freq: Four times a day (QID) | ORAL | Status: DC | PRN
  Administered 2016-04-15: 50 mg via ORAL
  Filled 2016-04-15: qty 1

## 2016-04-15 MED ORDER — ACETAMINOPHEN 325 MG PO TABS
650.0000 mg | ORAL_TABLET | Freq: Four times a day (QID) | ORAL | Status: DC | PRN
  Administered 2016-04-15: 650 mg via ORAL
  Filled 2016-04-15 (×3): qty 2

## 2016-04-15 MED ORDER — ALBUTEROL SULFATE HFA 108 (90 BASE) MCG/ACT IN AERS: 2.0000 | INHALATION_SPRAY | RESPIRATORY_TRACT | Status: DC | PRN

## 2016-04-15 MED ORDER — SODIUM CHLORIDE 0.9 % IV SOLN
500.0000 mg | Freq: Three times a day (TID) | INTRAVENOUS | Status: DC
  Administered 2016-04-15: 500 mg via INTRAVENOUS
  Filled 2016-04-15 (×2): qty 500

## 2016-04-15 MED ORDER — IOPAMIDOL (ISOVUE-300) INJECTION 61%
100.0000 mL | Freq: Once | INTRAVENOUS | Status: AC | PRN
  Administered 2016-04-15: 100 mL via INTRAVENOUS

## 2016-04-15 MED ORDER — SENNOSIDES-DOCUSATE SODIUM 8.6-50 MG PO TABS
1.0000 | ORAL_TABLET | Freq: Two times a day (BID) | ORAL | Status: DC
  Administered 2016-04-15 – 2016-04-17 (×5): 1 via ORAL
  Filled 2016-04-15 (×5): qty 1

## 2016-04-15 MED ORDER — MORPHINE SULFATE (PF) 2 MG/ML IV SOLN
2.0000 mg | INTRAVENOUS | Status: DC | PRN
  Administered 2016-04-15 – 2016-04-16 (×3): 2 mg via INTRAVENOUS
  Filled 2016-04-15 (×3): qty 1

## 2016-04-15 MED ORDER — ONDANSETRON HCL 4 MG PO TABS
4.0000 mg | ORAL_TABLET | Freq: Four times a day (QID) | ORAL | Status: DC | PRN
  Administered 2016-04-17: 4 mg via ORAL
  Filled 2016-04-15: qty 1

## 2016-04-15 MED ORDER — ONDANSETRON HCL 4 MG/2ML IJ SOLN
4.0000 mg | Freq: Four times a day (QID) | INTRAMUSCULAR | Status: DC | PRN
  Administered 2016-04-15 – 2016-04-17 (×2): 4 mg via INTRAVENOUS
  Filled 2016-04-15 (×3): qty 2

## 2016-04-15 MED ORDER — ACETAMINOPHEN 650 MG RE SUPP: 650.0000 mg | Freq: Four times a day (QID) | RECTAL | Status: DC | PRN

## 2016-04-15 MED ORDER — SODIUM CHLORIDE 0.9 % IV SOLN
INTRAVENOUS | Status: AC
  Administered 2016-04-15 – 2016-04-16 (×2): via INTRAVENOUS

## 2016-04-15 NOTE — H&P (Signed)
History and Physical    Carol HelperMary L Hlavaty ZOX:[id number] DOB: [birth year] DOA: 04/14/2016  PCP: Bing PlumeHENLEY,THOMAS F, MD  Patient coming from: Home.  Chief Complaint: Abdominal pain.  HPI: [name] is a 55 y.o. female with history of asthma presents to the ER because of persistent abdominal pain. Patient has been having abdominal pain mostly in the lower quadrants originating from the right flank area. Has been having nausea and poor appetite. Patient had come to the ER last week and at that time was diagnosed with UTI and was prescribed Keflex. Urine cultures showed Escherichia coli sensitive to ceftriaxone. Despite taking the antibiotics patient is still having the pain and since pain was persistent CT abdomen and pelvis was done which shows features concerning for pyelonephritis involving multiple areas of the right kidney. Since patient failed oral antibiotics and still has persistent abdominal pain, patient has been admitted for IV antibiotics and further observation. Denies any diarrhea or chest pain or shortness of breath.  ED Course: Was given IV ceftriaxone in the ER.  Review of Systems: As per HPI, rest all negative.   Past Medical History:  Diagnosis Date  . Anxiety   . Asthma   . Chronic kidney disease   . Complication of anesthesia    was slow to wake up with one of her surgeries  . Cough   . Headache    hx. migraines  . Hyperventilation   . Insomnia   . Pleurisy   . UTI (urinary tract infection)     Past Surgical History:  Procedure Laterality Date  . LUMBAR LAMINECTOMY N/A 07/04/2015   Procedure: Left L5-S1 MICRODISCECTOMY;  Surgeon: Kerrin ChampagneJames E Nitka, MD;  [facility];  Service: Orthopedics;  Laterality: N/A;  . LUMBAR SPINE SURGERY    . UTERINE FIBROID SURGERY    . VESICOVAGINAL FISTULA CLOSURE W/ TAH       reports that she has never smoked. She has never used smokeless tobacco. She reports that she does not drink alcohol or use drugs.  Allergies  Allergen  Reactions  . Doxycycline Anaphylaxis  . Metoclopramide Hcl Shortness Of Breath and Swelling  . Prochlorperazine Edisylate Anaphylaxis    Family History  Problem Relation Age of Onset  . Prostate cancer Father     Prior to Admission medications   Medication Sig Start Date End Date Taking? Authorizing Provider  albuterol (PROVENTIL) (2.5 MG/3ML) 0.083% nebulizer solution Take 2.5 mg by nebulization every 4 (four) hours as needed for wheezing or shortness of breath.   Yes Historical Provider, MD  cephALEXin (KEFLEX) 500 MG capsule Take 1 capsule (500 mg total) by mouth 4 (four) times daily. 04/10/16  Yes Geoffery Lyonsouglas Delo, MD  PROVENTIL HFA 108 (90 BASE) MCG/ACT inhaler INHALE 2 PUFFS BY MOUTH INTO THE LUNGS EVERY 4 HOURS AS NEEDED FOR WHEEZING OR SHORTNESS OF BREATH 06/09/15  Yes Waymon Budgelinton D Young, MD  aspirin EC 325 MG tablet Take 1 tablet (325 mg total) by mouth 2 (two) times daily after a meal. Patient not taking: Reported on 04/14/2016 07/04/15   Kerrin ChampagneJames E Nitka, MD  HYDROcodone-acetaminophen (NORCO/VICODIN) 5-325 MG per tablet Take 1 tablet by mouth every 4 (four) hours as needed. Patient not taking: Reported on 04/14/2016 08/20/13   Rolland PorterMark James, MD  methocarbamol (ROBAXIN) 500 MG tablet Take 1 tablet (500 mg total) by mouth every 8 (eight) hours as needed for muscle spasms. Patient not taking: Reported on 04/14/2016 07/04/15   Kerrin ChampagneJames E Nitka, MD  oxyCODONE-acetaminophen (PERCOCET/ROXICET)  5-325 MG tablet Take 1-2 tablets by mouth every 4 (four) hours as needed for severe pain. Patient not taking: Reported on 04/14/2016 07/04/15   Kerrin Champagne, MD    Physical Exam: Moderately built and nourished. Vitals:   04/14/16 1639 04/14/16 2020 04/14/16 2340 04/15/16 0322  BP: 146/87 128/74 123/77 127/75  Pulse: 67 65 63 65  Resp: 18 15 20 18   Temp: 97.8 F (36.6 C)     TempSrc: Oral     SpO2: 100% 100% 100% 100%  Weight: 78.2 kg (172 lb 6 oz)     Height: 5\' 5"  (1.651 m)         Constitutional:   04/14/16 1639 04/14/16 2020 04/14/16 2340 04/15/16 0322  BP: 146/87 128/74 123/77 127/75  Pulse: 67 65 63 65  Resp: 18 15 20 18   Temp: 97.8 F (36.6 C)     TempSrc: Oral     SpO2: 100% 100% 100% 100%  Weight: 78.2 kg (172 lb 6 oz)     Height: 5\' 5"  (1.651 m)      Eyes: Anicteric no pallor. ENMT: No discharge from the ears eyes nose or mouth. Neck: No mass felt. No JVD appreciated. Respiratory: No rhonchi or crepitations. Cardiovascular: S1 and S2 heard. No murmur appreciated. Abdomen: Soft since patient has received pain medication presently nontender no guarding or rigidity. Musculoskeletal: No edema. No joint effusion. Skin: No rash skin appears warm. Neurologic: Alert awake oriented to time place and person. Moves all extremities 5 x 5. Psychiatric: Appears normal. Normal affect.   Labs on Admission: I have personally reviewed following labs and imaging studies  CBC:  Recent Labs Lab 04/09/16 2010 04/14/16 1750  WBC 10.3 6.4  NEUTROABS 9.5* 3.1  HGB 11.5* 10.9*  HCT 32.9* 31.3*  MCV 81.2 80.5  PLT 186 278   Basic Metabolic Panel:  Recent Labs Lab 04/09/16 2010 04/14/16 1750  NA 140 143  K 3.3* 4.0  CL 109 109  CO2 22 26  GLUCOSE 86 99  BUN 20 11  CREATININE 1.28* 1.11*  CALCIUM 9.0 9.2   GFR: Estimated Creatinine Clearance: 59.9 mL/min (by C-G formula based on SCr of 1.11 mg/dL (H)). Liver Function Tests:  Recent Labs Lab 04/09/16 2010  AST 114*  ALT 93*  ALKPHOS 151*  BILITOT 1.4*  PROT 6.6  ALBUMIN 3.8   No results for input(s): LIPASE, AMYLASE in the last 168 hours. No results for input(s): AMMONIA in the last 168 hours. Coagulation Profile: No results for input(s): INR, PROTIME in the last 168 hours. Cardiac Enzymes: No results for input(s): CKTOTAL, CKMB, CKMBINDEX, TROPONINI in the last 168 hours. BNP (last 3 results) No results for input(s): PROBNP in the last 8760 hours. HbA1C: No results for input(s): HGBA1C in the last 72  hours. CBG: No results for input(s): GLUCAP in the last 168 hours. Lipid Profile: No results for input(s): CHOL, HDL, LDLCALC, TRIG, CHOLHDL, LDLDIRECT in the last 72 hours. Thyroid Function Tests: No results for input(s): TSH, T4TOTAL, FREET4, T3FREE, THYROIDAB in the last 72 hours. Anemia Panel: No results for input(s): VITAMINB12, FOLATE, FERRITIN, TIBC, IRON, RETICCTPCT in the last 72 hours. Urine analysis:    Component Value Date/Time   COLORURINE YELLOW 04/14/2016 1638   APPEARANCEUR CLOUDY (A) 04/14/2016 1638   LABSPEC 1.019 04/14/2016 1638   PHURINE 5.5 04/14/2016 1638   GLUCOSEU NEGATIVE 04/14/2016 1638   HGBUR MODERATE (A) 04/14/2016 1638   BILIRUBINUR NEGATIVE 04/14/2016 1638   KETONESUR NEGATIVE 04/14/2016  1638   PROTEINUR NEGATIVE 04/14/2016 1638   UROBILINOGEN 0.2 05/15/2008 0315   NITRITE NEGATIVE 04/14/2016 1638   LEUKOCYTESUR SMALL (A) 04/14/2016 1638   Sepsis Labs: @LABRCNTIP (procalcitonin:4,lacticidven:4) ) Recent Results (from the past 240 hour(s))  Urine culture     Status: Abnormal   Collection Time: 04/09/16  8:15 PM  Result Value Ref Range Status   Specimen Description URINE, CLEAN CATCH  Final   Special Requests NONE  Final   Culture >=100,000 COLONIES/mL ESCHERICHIA COLI (A)  Final   Report Status 04/12/2016 FINAL  Final   Organism ID, Bacteria ESCHERICHIA COLI (A)  Final      Susceptibility   Escherichia coli - MIC*    AMPICILLIN >=32 RESISTANT Resistant     CEFAZOLIN 16 SENSITIVE Sensitive     CEFTRIAXONE <=1 SENSITIVE Sensitive     CIPROFLOXACIN 1 SENSITIVE Sensitive     GENTAMICIN >=16 RESISTANT Resistant     IMIPENEM <=0.25 SENSITIVE Sensitive     NITROFURANTOIN <=16 SENSITIVE Sensitive     TRIMETH/SULFA >=320 RESISTANT Resistant     AMPICILLIN/SULBACTAM >=32 RESISTANT Resistant     PIP/TAZO >=128 RESISTANT Resistant     Extended ESBL NEGATIVE Sensitive     * >=100,000 COLONIES/mL ESCHERICHIA COLI     Radiological Exams on  Admission: Ct Abdomen Pelvis W Contrast  Result Date: 04/15/2016 CLINICAL DATA:  Acute onset of severe lower abdominal pain, worse on the right. Gross hematuria. Initial encounter. EXAM: CT ABDOMEN AND PELVIS WITH CONTRAST TECHNIQUE: Multidetector CT imaging of the abdomen and pelvis was performed using the standard protocol following bolus administration of intravenous contrast. CONTRAST:  ISOVUE-300 IOPAMIDOL (ISOVUE-300) INJECTION 61% COMPARISON:  None. FINDINGS: Lower chest: Minimal bibasilar atelectasis is noted. Mild bibasilar bronchiectasis is seen. Hepatobiliary: The liver is unremarkable in appearance. The gallbladder is unremarkable in appearance. The common bile duct remains normal in caliber. Pancreas: The pancreas is within normal limits. Spleen: The spleen is unremarkable in appearance. Adrenals/Urinary Tract: The adrenal glands are unremarkable in appearance. Multiple areas of mildly decreased enhancement are noted at the right kidney, compatible with multifocal right-sided pyelonephritis. There is no evidence of hydronephrosis. No renal or ureteral stones are identified. No perinephric stranding is seen. Stomach/Bowel: The stomach is unremarkable in appearance. The small bowel is within normal limits. The appendix is normal in caliber, without evidence of appendicitis. The colon is unremarkable in appearance Scattered diverticulosis is noted along the descending and proximal sigmoid colon, without evidence of diverticulitis. Vascular/Lymphatic: The abdominal aorta is unremarkable in appearance. The inferior vena cava is grossly unremarkable. No retroperitoneal lymphadenopathy is seen. No pelvic sidewall lymphadenopathy is identified. Reproductive: The bladder is mildly distended and grossly unremarkable. The patient is status post hysterectomy. No suspicious adnexal masses are seen. Other: No additional soft tissue abnormalities are seen. Musculoskeletal: No acute osseous abnormalities are  identified. Mild disc space narrowing and vacuum phenomenon are noted at L4-L5. The visualized musculature is unremarkable in appearance. IMPRESSION: 1. Multiple areas of decreased enhancement at the right kidney, suspicious for multifocal right-sided pyelonephritis. 2. Mild bibasilar bronchiectasis noted. 3. Scattered diverticulosis along the descending and proximal sigmoid colon, without evidence of diverticulitis. Electronically Signed   By: Roanna Raider M.D.   On: 04/15/2016 02:09     Assessment/Plan Principal Problem:   Pyelonephritis Active Problems:   Normochromic normocytic anemia   Elevated LFTs    1. Pyelonephritis - patient has persistent abdominal pain over the last 1 week despite taking oral antibiotics which were sensitive  to the urine culture. Repeat urine cultures have been sent. For now I have placed patient on imipenem. Clinically observe. Continue hydration. Patient is placed on tramadol for pain relief. 2. Elevated LFTs - labs done last week showed elevated LFTs. CT scan does not show any evidence of gallbladder pathology. We will repeat LFTs and check acute hepatitis panel. 3. Normocytic normochromic anemia - follow CBC. 4. History of asthma - presently not wheezing.   DVT prophylaxis: SCDs. Code Status: Full code.  Family Communication: Discussed with patient.  Disposition Plan: Home.  Consults called: None.  Admission status: Observation.    Eduard Clos MD [facility] Pager [phone].  If 7PM-7AM, please contact night-coverage [email or web address] Password [email or web address]  04/15/2016, 5:22 AM

## 2016-04-15 NOTE — Progress Notes (Signed)
Pharmacy Antibiotic Note  [name] is a 55 y.o. female admitted on 04/14/2016 with UTI. Pt on Keflex outpt with no improvement.  CT scan concerning for multifocal pyelonephritis.  Pharmacy has been consulted for primaxin dosing.  Plan: Primaxin 500mg  IV q8h  Height: 5\' 5"  (165.1 cm) Weight: 172 lb 6 oz (78.2 kg) IBW/kg (Calculated) : 57  Temp (24hrs), Avg:97.8 F (36.6 C), Min:97.8 F (36.6 C), Max:97.8 F (36.6 C)   Recent Labs Lab 04/09/16 2010 04/14/16 1750  WBC 10.3 6.4  CREATININE 1.28* 1.11*    Estimated Creatinine Clearance: 59.9 mL/min (by C-G formula based on SCr of 1.11 mg/dL (H)).    Allergies  Allergen Reactions  . Doxycycline Anaphylaxis  . Metoclopramide Hcl Shortness Of Breath and Swelling  . Prochlorperazine Edisylate Anaphylaxis    Antimicrobials this admission: 10/ Primaxin >>    >>   Dose adjustments this admission:   Microbiology results:  BCx:   UCx:    Sputum:    MRSA PCR:   Thank you for allowing pharmacy to be a part of this patient's care.  Lorenza EvangelistGreen, Tayveon Lombardo R 04/15/2016 5:33 AM

## 2016-04-15 NOTE — Progress Notes (Signed)
I have seen and assessed patient and agree with Dr. Katherene PontoKakrakandy's assessment and plan. Patient is a pleasant 55 year old lady presented to the ED with acute pyelonephritis after failing outpatient antibiotic treatment. Continue empiric IV antibiotics while urine cultures are pending. Place on IV morphine when necessary. Also placed on MSIR 15 mg by mouth every 4 hours when necessary pain. IV fluids. Supportive care.

## 2016-04-16 LAB — URINE CULTURE: Culture: NO GROWTH

## 2016-04-16 LAB — CBC
HCT: 34.4 % — ABNORMAL LOW (ref 36.0–46.0)
HEMOGLOBIN: 11.4 g/dL — AB (ref 12.0–15.0)
MCH: 28 pg (ref 26.0–34.0)
MCHC: 33.1 g/dL (ref 30.0–36.0)
MCV: 84.5 fL (ref 78.0–100.0)
PLATELETS: 309 10*3/uL (ref 150–400)
RBC: 4.07 MIL/uL (ref 3.87–5.11)
RDW: 14.6 % (ref 11.5–15.5)
WBC: 6.4 10*3/uL (ref 4.0–10.5)

## 2016-04-16 LAB — COMPREHENSIVE METABOLIC PANEL
ALBUMIN: 3.2 g/dL — AB (ref 3.5–5.0)
ALT: 47 U/L (ref 14–54)
AST: 24 U/L (ref 15–41)
Alkaline Phosphatase: 272 U/L — ABNORMAL HIGH (ref 38–126)
Anion gap: 6 (ref 5–15)
BUN: 11 mg/dL (ref 6–20)
CHLORIDE: 112 mmol/L — AB (ref 101–111)
CO2: 24 mmol/L (ref 22–32)
CREATININE: 1.01 mg/dL — AB (ref 0.44–1.00)
Calcium: 8.9 mg/dL (ref 8.9–10.3)
GFR calc Af Amer: >60 mL/min (ref >60)
GFR calc non Af Amer: >60 mL/min (ref >60)
GLUCOSE: 92 mg/dL (ref 65–99)
Potassium: 4.2 mmol/L (ref 3.5–5.1)
SODIUM: 142 mmol/L (ref 135–145)
Total Bilirubin: 0.5 mg/dL (ref 0.3–1.2)
Total Protein: 7.3 g/dL (ref 6.5–8.1)

## 2016-04-16 LAB — HEPATITIS PANEL, ACUTE
HCV Ab: <0.1 s/co ratio (ref 0.0–0.9)
HEP A IGM: NEGATIVE
HEP B C IGM: NEGATIVE
Hepatitis B Surface Ag: NEGATIVE

## 2016-04-16 LAB — MAGNESIUM: Magnesium: 2 mg/dL (ref 1.7–2.4)

## 2016-04-16 MED ORDER — SODIUM CHLORIDE 0.9 % IV BOLUS (SEPSIS)
1000.0000 mL | Freq: Once | INTRAVENOUS | Status: AC
  Administered 2016-04-16: 1000 mL via INTRAVENOUS

## 2016-04-16 MED ORDER — DEXTROSE 5 % IV SOLN
2.0000 g | INTRAVENOUS | Status: DC
  Administered 2016-04-16 – 2016-04-19 (×4): 2 g via INTRAVENOUS
  Filled 2016-04-16 (×6): qty 2

## 2016-04-16 MED ORDER — SODIUM CHLORIDE 0.9 % IV SOLN
INTRAVENOUS | Status: DC
  Administered 2016-04-16 – 2016-04-17 (×2): via INTRAVENOUS

## 2016-04-16 MED ORDER — MORPHINE SULFATE 15 MG PO TABS
15.0000 mg | ORAL_TABLET | ORAL | Status: DC | PRN
  Administered 2016-04-16: 30 mg via ORAL
  Administered 2016-04-16: 15 mg via ORAL
  Administered 2016-04-16 – 2016-04-17 (×2): 30 mg via ORAL
  Filled 2016-04-16: qty 2
  Filled 2016-04-16: qty 1
  Filled 2016-04-16 (×2): qty 2

## 2016-04-16 MED ORDER — MORPHINE SULFATE (PF) 2 MG/ML IV SOLN
2.0000 mg | INTRAVENOUS | Status: DC | PRN
  Administered 2016-04-17 – 2016-04-18 (×5): 2 mg via INTRAVENOUS
  Filled 2016-04-16 (×6): qty 1

## 2016-04-16 NOTE — Progress Notes (Signed)
PROGRESS NOTE    [name]  JXB:[id number] DOB: [birth year] DOA: 04/14/2016 PCP: Bing Plume, MD    Brief Narrative:   [name] is a 55 y.o. female with history of asthma presents to the ER because of persistent abdominal pain. Patient has been having abdominal pain mostly in the lower quadrants originating from the right flank area. Has been having nausea and poor appetite. Patient had come to the ER last week and at that time was diagnosed with UTI and was prescribed Keflex. Urine cultures showed Escherichia coli sensitive to ceftriaxone. Despite taking the antibiotics patient is still having the pain and since pain was persistent CT abdomen and pelvis was done which shows features concerning for pyelonephritis involving multiple areas of the right kidney. Since patient failed oral antibiotics and still has persistent abdominal pain, patient has been admitted for IV antibiotics and further observation. Denies any diarrhea or chest pain or shortness of breath.   Assessment & Plan:   Principal Problem:   Pyelonephritis Active Problems:   Normochromic normocytic anemia   Elevated LFTs  #1 multifocal right-sided pyelonephritis Patient with lower abdominal pain. Patient afebrile. Blood cultures pending. Urine cultures negative. Cultures from 04/09/2016 with greater than 100,000 Escherichia coli which was an extended ESBL was sensitive to the third generation cephalosporins, resistant to the penicillins and gentamicin and Zosyn and Unasyn. Patient was treated with Macrobid as well as oral Keflex which was a first generation cephalosporin. Will discontinue Primaxin and narrowed down to IV Rocephin. Pain management. K pad. Follow.  #2 elevated LFTs CT scan negative for any gallbladder pathology. LFTs trending down and have improved. Acute hepatitis panel is negative. IV fluids. Follow.  #3 dehydration   IV fluids.  #4 normocytic anemia Follow H&H.  #5 history of  asthma Stable.   DVT prophylaxis: SCDs Code Status: Full Family Communication: Updated patient. No family at bedside. Disposition Plan: Home when medically stable and improved with improvement with abdominal pain.   Consultants:   None  Procedures:   CT abdomen and pelvis 04/15/2016  Antimicrobials:   IV Primaxin 04/15/2016>>>>> 04/16/2016  IV Rocephin 04/16/2016   Subjective: Patient still complains of lower abdominal pain. Slight improvement from on admission.  Objective: Vitals:   04/15/16 0526 04/15/16 1342 04/15/16 2131 04/16/16 0549  BP: 140/75 120/69 (!) 106/54 110/66  Pulse: 64 (!) 55 62 68  Resp: 18 18 18 16   Temp: 97.8 F (36.6 C) 98 F (36.7 C) 97.7 F (36.5 C) 97.9 F (36.6 C)  TempSrc: Oral Oral Oral Oral  SpO2: 98% 99% 97% 100%  Weight:      Height:        Intake/Output Summary (Last 24 hours) at 04/16/16 1304 Last data filed at 04/16/16 1021  Gross per 24 hour  Intake          2412.08 ml  Output             2500 ml  Net           -87.92 ml   Filed Weights   04/14/16 1639  Weight: 78.2 kg (172 lb 6 oz)    Examination:  General exam: Appears calm and comfortable  Respiratory system: Clear to auscultation. Respiratory effort normal. Cardiovascular system: S1 & S2 heard, RRR. No JVD, murmurs, rubs, gallops or clicks. No pedal edema. Gastrointestinal system: Abdomen is nondistended, soft and tender to palpation in the lower abdominal region. Some slight CVA tenderness to palpation. No organomegaly or masses felt.  Normal bowel sounds heard. Central nervous system: Alert and oriented. No focal neurological deficits. Extremities: Symmetric 5 x 5 power. Skin: No rashes, lesions or ulcers Psychiatry: Judgement and insight appear normal. Mood & affect appropriate.     Data Reviewed: I have personally reviewed following labs and imaging studies  CBC:  Recent Labs Lab 04/09/16 2010 04/14/16 1750 04/15/16 0705 04/16/16 0540  WBC 10.3  6.4 6.0 6.4  NEUTROABS 9.5* 3.1 3.4  --   HGB 11.5* 10.9* 10.9* 11.4*  HCT 32.9* 31.3* 32.7* 34.4*  MCV 81.2 80.5 84.3 84.5  PLT 186 278 275 309   Basic Metabolic Panel:  Recent Labs Lab 04/09/16 2010 04/14/16 1750 04/15/16 0705 04/16/16 0540  NA 140 143 143 142  K 3.3* 4.0 4.1 4.2  CL 109 109 110 112*  CO2 22 26 26 24   GLUCOSE 86 99 92 92  BUN 20 11 10 11   CREATININE 1.28* 1.11* 1.05* 1.01*  CALCIUM 9.0 9.2 9.2 8.9  MG  --   --   --  2.0   GFR: Estimated Creatinine Clearance: 65.8 mL/min (by C-G formula based on SCr of 1.01 mg/dL (H)). Liver Function Tests:  Recent Labs Lab 04/09/16 2010 04/15/16 0705 04/16/16 0540  AST 114* 31 24  ALT 93* 61* 47  ALKPHOS 151* 316* 272*  BILITOT 1.4* 0.7 0.5  PROT 6.6 7.2 7.3  ALBUMIN 3.8 3.3* 3.2*   No results for input(s): LIPASE, AMYLASE in the last 168 hours. No results for input(s): AMMONIA in the last 168 hours. Coagulation Profile: No results for input(s): INR, PROTIME in the last 168 hours. Cardiac Enzymes: No results for input(s): CKTOTAL, CKMB, CKMBINDEX, TROPONINI in the last 168 hours. BNP (last 3 results) No results for input(s): PROBNP in the last 8760 hours. HbA1C: No results for input(s): HGBA1C in the last 72 hours. CBG: No results for input(s): GLUCAP in the last 168 hours. Lipid Profile: No results for input(s): CHOL, HDL, LDLCALC, TRIG, CHOLHDL, LDLDIRECT in the last 72 hours. Thyroid Function Tests: No results for input(s): TSH, T4TOTAL, FREET4, T3FREE, THYROIDAB in the last 72 hours. Anemia Panel: No results for input(s): VITAMINB12, FOLATE, FERRITIN, TIBC, IRON, RETICCTPCT in the last 72 hours. Sepsis Labs: No results for input(s): PROCALCITON, LATICACIDVEN in the last 168 hours.  Recent Results (from the past 240 hour(s))  Urine culture     Status: Abnormal   Collection Time: 04/09/16  8:15 PM  Result Value Ref Range Status   Specimen Description URINE, CLEAN CATCH  Final   Special Requests  NONE  Final   Culture >=100,000 COLONIES/mL ESCHERICHIA COLI (A)  Final   Report Status 04/12/2016 FINAL  Final   Organism ID, Bacteria ESCHERICHIA COLI (A)  Final      Susceptibility   Escherichia coli - MIC*    AMPICILLIN >=32 RESISTANT Resistant     CEFAZOLIN 16 SENSITIVE Sensitive     CEFTRIAXONE <=1 SENSITIVE Sensitive     CIPROFLOXACIN 1 SENSITIVE Sensitive     GENTAMICIN >=16 RESISTANT Resistant     IMIPENEM <=0.25 SENSITIVE Sensitive     NITROFURANTOIN <=16 SENSITIVE Sensitive     TRIMETH/SULFA >=320 RESISTANT Resistant     AMPICILLIN/SULBACTAM >=32 RESISTANT Resistant     PIP/TAZO >=128 RESISTANT Resistant     Extended ESBL NEGATIVE Sensitive     * >=100,000 COLONIES/mL ESCHERICHIA COLI  Urine culture     Status: None   Collection Time: 04/14/16  4:38 PM  Result Value Ref Range Status  Specimen Description URINE, RANDOM  Final   Special Requests NONE  Final   Culture   Final    NO GROWTH 1 DAY Performed at [facility]    Report Status 04/16/2016 FINAL  Final  Culture, blood (Routine X 2) w Reflex to ID Panel     Status: None (Preliminary result)   Collection Time: 04/15/16  4:15 AM  Result Value Ref Range Status   Specimen Description BLOOD LEFT ANTECUBITAL  Final   Special Requests BOTTLES DRAWN AEROBIC AND ANAEROBIC 5CC EA  Final   Culture   Final    NO GROWTH 1 DAY Performed at [facility]    Report Status PENDING  Incomplete  Culture, blood (Routine X 2) w Reflex to ID Panel     Status: None (Preliminary result)   Collection Time: 04/15/16  4:15 AM  Result Value Ref Range Status   Specimen Description BLOOD BLOOD LEFT HAND  Final   Special Requests IN PEDIATRIC BOTTLE 3CC  Final   Culture   Final    NO GROWTH 1 DAY Performed at [facility]    Report Status PENDING  Incomplete         Radiology Studies: Ct Abdomen Pelvis W Contrast  Result Date: 04/15/2016 CLINICAL DATA:  Acute onset of severe lower abdominal pain, worse  on the right. Gross hematuria. Initial encounter. EXAM: CT ABDOMEN AND PELVIS WITH CONTRAST TECHNIQUE: Multidetector CT imaging of the abdomen and pelvis was performed using the standard protocol following bolus administration of intravenous contrast. CONTRAST:  ISOVUE-300 IOPAMIDOL (ISOVUE-300) INJECTION 61% COMPARISON:  None. FINDINGS: Lower chest: Minimal bibasilar atelectasis is noted. Mild bibasilar bronchiectasis is seen. Hepatobiliary: The liver is unremarkable in appearance. The gallbladder is unremarkable in appearance. The common bile duct remains normal in caliber. Pancreas: The pancreas is within normal limits. Spleen: The spleen is unremarkable in appearance. Adrenals/Urinary Tract: The adrenal glands are unremarkable in appearance. Multiple areas of mildly decreased enhancement are noted at the right kidney, compatible with multifocal right-sided pyelonephritis. There is no evidence of hydronephrosis. No renal or ureteral stones are identified. No perinephric stranding is seen. Stomach/Bowel: The stomach is unremarkable in appearance. The small bowel is within normal limits. The appendix is normal in caliber, without evidence of appendicitis. The colon is unremarkable in appearance Scattered diverticulosis is noted along the descending and proximal sigmoid colon, without evidence of diverticulitis. Vascular/Lymphatic: The abdominal aorta is unremarkable in appearance. The inferior vena cava is grossly unremarkable. No retroperitoneal lymphadenopathy is seen. No pelvic sidewall lymphadenopathy is identified. Reproductive: The bladder is mildly distended and grossly unremarkable. The patient is status post hysterectomy. No suspicious adnexal masses are seen. Other: No additional soft tissue abnormalities are seen. Musculoskeletal: No acute osseous abnormalities are identified. Mild disc space narrowing and vacuum phenomenon are noted at L4-L5. The visualized musculature is unremarkable in  appearance. IMPRESSION: 1. Multiple areas of decreased enhancement at the right kidney, suspicious for multifocal right-sided pyelonephritis. 2. Mild bibasilar bronchiectasis noted. 3. Scattered diverticulosis along the descending and proximal sigmoid colon, without evidence of diverticulitis. Electronically Signed   By: Roanna Raider M.D.   On: 04/15/2016 02:09        Scheduled Meds: . imipenem-cilastatin  500 mg Intravenous Q6H  . senna-docusate  1 tablet Oral BID   Continuous Infusions:    LOS: 1 day    Time spent: 35 minutes    Izack Hoogland, MD [facility] Pager [phone]  If 7PM-7AM, please contact night-coverage  [email or web address] Password [email or web address] 04/16/2016, 1:04 PM

## 2016-04-17 LAB — BASIC METABOLIC PANEL
ANION GAP: 6 (ref 5–15)
BUN: 8 mg/dL (ref 6–20)
CHLORIDE: 117 mmol/L — AB (ref 101–111)
CO2: 20 mmol/L — AB (ref 22–32)
CREATININE: 0.77 mg/dL (ref 0.44–1.00)
Calcium: 8.7 mg/dL — ABNORMAL LOW (ref 8.9–10.3)
GFR calc non Af Amer: >60 mL/min (ref >60)
Glucose, Bld: 89 mg/dL (ref 65–99)
POTASSIUM: 4.5 mmol/L (ref 3.5–5.1)
Sodium: 143 mmol/L (ref 135–145)

## 2016-04-17 LAB — CBC WITH DIFFERENTIAL/PLATELET
Basophils Absolute: 0 10*3/uL (ref 0.0–0.1)
Basophils Relative: 0 %
Eosinophils Absolute: 0.2 10*3/uL (ref 0.0–0.7)
Eosinophils Relative: 4 %
HEMATOCRIT: 30.5 % — AB (ref 36.0–46.0)
HEMOGLOBIN: 10.1 g/dL — AB (ref 12.0–15.0)
LYMPHS ABS: 1.5 10*3/uL (ref 0.7–4.0)
LYMPHS PCT: 30 %
MCH: 28.2 pg (ref 26.0–34.0)
MCHC: 33.1 g/dL (ref 30.0–36.0)
MCV: 85.2 fL (ref 78.0–100.0)
MONOS PCT: 5 %
Monocytes Absolute: 0.3 10*3/uL (ref 0.1–1.0)
NEUTROS ABS: 3 10*3/uL (ref 1.7–7.7)
NEUTROS PCT: 61 %
Platelets: 333 10*3/uL (ref 150–400)
RBC: 3.58 MIL/uL — AB (ref 3.87–5.11)
RDW: 14.8 % (ref 11.5–15.5)
WBC: 4.9 10*3/uL (ref 4.0–10.5)

## 2016-04-17 MED ORDER — SENNOSIDES-DOCUSATE SODIUM 8.6-50 MG PO TABS
2.0000 | ORAL_TABLET | Freq: Two times a day (BID) | ORAL | Status: DC
  Administered 2016-04-18 – 2016-04-20 (×4): 2 via ORAL
  Filled 2016-04-17 (×6): qty 2

## 2016-04-17 MED ORDER — SODIUM CHLORIDE 0.9 % IV SOLN
8.0000 mg | Freq: Four times a day (QID) | INTRAVENOUS | Status: DC | PRN
  Administered 2016-04-18: 8 mg via INTRAVENOUS
  Filled 2016-04-17 (×5): qty 4

## 2016-04-17 MED ORDER — MORPHINE SULFATE 15 MG PO TABS: 30.0000 mg | ORAL_TABLET | ORAL | Status: DC | PRN

## 2016-04-17 MED ORDER — LORAZEPAM 2 MG/ML IJ SOLN
0.5000 mg | Freq: Once | INTRAMUSCULAR | Status: AC
  Administered 2016-04-17: 0.5 mg via INTRAVENOUS
  Filled 2016-04-17: qty 1

## 2016-04-17 MED ORDER — ONDANSETRON HCL 4 MG/2ML IJ SOLN
4.0000 mg | Freq: Once | INTRAMUSCULAR | Status: AC
  Administered 2016-04-17: 4 mg via INTRAVENOUS

## 2016-04-17 MED ORDER — LORAZEPAM 2 MG/ML IJ SOLN
0.5000 mg | Freq: Four times a day (QID) | INTRAMUSCULAR | Status: DC | PRN
  Administered 2016-04-17 – 2016-04-19 (×4): 0.5 mg via INTRAVENOUS
  Filled 2016-04-17 (×4): qty 1

## 2016-04-17 MED ORDER — ONDANSETRON HCL 4 MG PO TABS
8.0000 mg | ORAL_TABLET | Freq: Four times a day (QID) | ORAL | Status: DC | PRN
  Administered 2016-04-19 – 2016-04-20 (×2): 8 mg via ORAL
  Filled 2016-04-17 (×2): qty 2

## 2016-04-17 NOTE — Progress Notes (Signed)
PROGRESS NOTE    [name]  ZOX:[id number] DOB: [birth year] DOA: 04/14/2016 PCP: Bing Plume, MD    Brief Narrative:   [name] is a 55 y.o. female with history of asthma presents to the ER because of persistent abdominal pain. Patient has been having abdominal pain mostly in the lower quadrants originating from the right flank area. Has been having nausea and poor appetite. Patient had come to the ER last week and at that time was diagnosed with UTI and was prescribed Keflex. Urine cultures showed Escherichia coli sensitive to ceftriaxone. Despite taking the antibiotics patient is still having the pain and since pain was persistent CT abdomen and pelvis was done which shows features concerning for pyelonephritis involving multiple areas of the right kidney. Since patient failed oral antibiotics and still has persistent abdominal pain, patient has been admitted for IV antibiotics and further observation. Denies any diarrhea or chest pain or shortness of breath.   Assessment & Plan:   Principal Problem:   Pyelonephritis Active Problems:   Normochromic normocytic anemia   Elevated LFTs  #1 multifocal right-sided pyelonephritis Patient with lower abdominal pain. Patient afebrile. Blood cultures pending. Urine cultures negative. Cultures from 04/09/2016 with greater than 100,000 Escherichia coli which was an extended ESBL was sensitive to the third generation cephalosporins, resistant to the penicillins and gentamicin and Zosyn and Unasyn. Patient was treated with Macrobid as well as oral Keflex which was a first generation cephalosporin. Discontinued Primaxin and narrowed down to IV Rocephin. Pain management. K pad. Follow.  #2 elevated LFTs CT scan negative for any gallbladder pathology. LFTs trending down and have improved. Acute hepatitis panel is negative. IV fluids. Follow.  #3 dehydration   IV fluids.  #4 normocytic anemia Follow H&H.  #5 history of  asthma Stable.   DVT prophylaxis: SCDs Code Status: Full Family Communication: Updated patient. No family at bedside. Disposition Plan: Home when medically stable and improved with improvement with abdominal pain.   Consultants:   None  Procedures:   CT abdomen and pelvis 04/15/2016  Antimicrobials:   IV Primaxin 04/15/2016>>>>> 04/16/2016  IV Rocephin 04/16/2016   Subjective: Patient still complains of lower abdominal pain. Patient complaining of nausea. Patient states abdominal pain not as constant. Tolerating current diet.   Objective: Vitals:   04/16/16 0549 04/16/16 1346 04/16/16 2132 04/17/16 0600  BP: 110/66 (!) 98/55 112/67 115/65  Pulse: 68 60 (!) 58 62  Resp: 16 16 16 16   Temp: 97.9 F (36.6 C) 98.8 F (37.1 C) 97.8 F (36.6 C) 98 F (36.7 C)  TempSrc: Oral Oral Oral Oral  SpO2: 100% 100% 100% 100%  Weight:      Height:        Intake/Output Summary (Last 24 hours) at 04/17/16 1239 Last data filed at 04/17/16 0200  Gross per 24 hour  Intake             2305 ml  Output                0 ml  Net             2305 ml   Filed Weights   04/14/16 1639  Weight: 78.2 kg (172 lb 6 oz)    Examination:  General exam: Appears calm and comfortable  Respiratory system: Clear to auscultation. Respiratory effort normal. Cardiovascular system: S1 & S2 heard, RRR. No JVD, murmurs, rubs, gallops or clicks. No pedal edema. Gastrointestinal system: Abdomen is nondistended, soft and tender to  palpation in the lower abdominal region. Some slight CVA tenderness to palpation. No organomegaly or masses felt. Normal bowel sounds heard. Central nervous system: Alert and oriented. No focal neurological deficits. Extremities: Symmetric 5 x 5 power. Skin: No rashes, lesions or ulcers Psychiatry: Judgement and insight appear normal. Mood & affect appropriate.     Data Reviewed: I have personally reviewed following labs and imaging studies  CBC:  Recent Labs Lab  04/14/16 1750 04/15/16 0705 04/16/16 0540 04/17/16 0904  WBC 6.4 6.0 6.4 4.9  NEUTROABS 3.1 3.4  --  3.0  HGB 10.9* 10.9* 11.4* 10.1*  HCT 31.3* 32.7* 34.4* 30.5*  MCV 80.5 84.3 84.5 85.2  PLT 278 275 309 333   Basic Metabolic Panel:  Recent Labs Lab 04/14/16 1750 04/15/16 0705 04/16/16 0540 04/17/16 0904  NA 143 143 142 143  K 4.0 4.1 4.2 4.5  CL 109 110 112* 117*  CO2 26 26 24  20*  GLUCOSE 99 92 92 89  BUN 11 10 11 8   CREATININE 1.11* 1.05* 1.01* 0.77  CALCIUM 9.2 9.2 8.9 8.7*  MG  --   --  2.0  --    GFR: Estimated Creatinine Clearance: 83.1 mL/min (by C-G formula based on SCr of 0.77 mg/dL). Liver Function Tests:  Recent Labs Lab 04/15/16 0705 04/16/16 0540  AST 31 24  ALT 61* 47  ALKPHOS 316* 272*  BILITOT 0.7 0.5  PROT 7.2 7.3  ALBUMIN 3.3* 3.2*   No results for input(s): LIPASE, AMYLASE in the last 168 hours. No results for input(s): AMMONIA in the last 168 hours. Coagulation Profile: No results for input(s): INR, PROTIME in the last 168 hours. Cardiac Enzymes: No results for input(s): CKTOTAL, CKMB, CKMBINDEX, TROPONINI in the last 168 hours. BNP (last 3 results) No results for input(s): PROBNP in the last 8760 hours. HbA1C: No results for input(s): HGBA1C in the last 72 hours. CBG: No results for input(s): GLUCAP in the last 168 hours. Lipid Profile: No results for input(s): CHOL, HDL, LDLCALC, TRIG, CHOLHDL, LDLDIRECT in the last 72 hours. Thyroid Function Tests: No results for input(s): TSH, T4TOTAL, FREET4, T3FREE, THYROIDAB in the last 72 hours. Anemia Panel: No results for input(s): VITAMINB12, FOLATE, FERRITIN, TIBC, IRON, RETICCTPCT in the last 72 hours. Sepsis Labs: No results for input(s): PROCALCITON, LATICACIDVEN in the last 168 hours.  Recent Results (from the past 240 hour(s))  Urine culture     Status: Abnormal   Collection Time: 04/09/16  8:15 PM  Result Value Ref Range Status   Specimen Description URINE, CLEAN CATCH   Final   Special Requests NONE  Final   Culture >=100,000 COLONIES/mL ESCHERICHIA COLI (A)  Final   Report Status 04/12/2016 FINAL  Final   Organism ID, Bacteria ESCHERICHIA COLI (A)  Final      Susceptibility   Escherichia coli - MIC*    AMPICILLIN >=32 RESISTANT Resistant     CEFAZOLIN 16 SENSITIVE Sensitive     CEFTRIAXONE <=1 SENSITIVE Sensitive     CIPROFLOXACIN 1 SENSITIVE Sensitive     GENTAMICIN >=16 RESISTANT Resistant     IMIPENEM <=0.25 SENSITIVE Sensitive     NITROFURANTOIN <=16 SENSITIVE Sensitive     TRIMETH/SULFA >=320 RESISTANT Resistant     AMPICILLIN/SULBACTAM >=32 RESISTANT Resistant     PIP/TAZO >=128 RESISTANT Resistant     Extended ESBL NEGATIVE Sensitive     * >=100,000 COLONIES/mL ESCHERICHIA COLI  Urine culture     Status: None   Collection Time: 04/14/16  4:38 PM  Result Value Ref Range Status   Specimen Description URINE, RANDOM  Final   Special Requests NONE  Final   Culture   Final    NO GROWTH 1 DAY Performed at [facility]    Report Status 04/16/2016 FINAL  Final  Culture, blood (Routine X 2) w Reflex to ID Panel     Status: None (Preliminary result)   Collection Time: 04/15/16  4:15 AM  Result Value Ref Range Status   Specimen Description BLOOD LEFT ANTECUBITAL  Final   Special Requests BOTTLES DRAWN AEROBIC AND ANAEROBIC 5CC EA  Final   Culture   Final    NO GROWTH 2 DAYS Performed at [facility]    Report Status PENDING  Incomplete  Culture, blood (Routine X 2) w Reflex to ID Panel     Status: None (Preliminary result)   Collection Time: 04/15/16  4:15 AM  Result Value Ref Range Status   Specimen Description BLOOD BLOOD LEFT HAND  Final   Special Requests IN PEDIATRIC BOTTLE 3CC  Final   Culture   Final    NO GROWTH 2 DAYS Performed at [facility]    Report Status PENDING  Incomplete         Radiology Studies: No results found.      Scheduled Meds: . cefTRIAXone (ROCEPHIN)  IV  2 g Intravenous Q24H   . senna-docusate  1 tablet Oral BID   Continuous Infusions: . sodium chloride 100 mL/hr at 04/16/16 1900     LOS: 2 days    Time spent: 35 minutes    Kehlani Vancamp, MD [facility] Pager [phone]  If 7PM-7AM, please contact night-coverage [email or web address] Password [facility] 04/17/2016, 12:39 PM

## 2016-04-18 ENCOUNTER — Inpatient Hospital Stay (HOSPITAL_COMMUNITY): Payer: PRIVATE HEALTH INSURANCE

## 2016-04-18 DIAGNOSIS — R05 Cough: Secondary | ICD-10-CM

## 2016-04-18 LAB — COMPREHENSIVE METABOLIC PANEL
ALBUMIN: 3.2 g/dL — AB (ref 3.5–5.0)
ALT: 33 U/L (ref 14–54)
AST: 19 U/L (ref 15–41)
Alkaline Phosphatase: 208 U/L — ABNORMAL HIGH (ref 38–126)
Anion gap: 7 (ref 5–15)
BUN: 8 mg/dL (ref 6–20)
CHLORIDE: 111 mmol/L (ref 101–111)
CO2: 26 mmol/L (ref 22–32)
CREATININE: 0.88 mg/dL (ref 0.44–1.00)
Calcium: 9.3 mg/dL (ref 8.9–10.3)
GFR calc non Af Amer: >60 mL/min (ref >60)
Glucose, Bld: 92 mg/dL (ref 65–99)
Potassium: 4.3 mmol/L (ref 3.5–5.1)
SODIUM: 144 mmol/L (ref 135–145)
Total Bilirubin: 0.8 mg/dL (ref 0.3–1.2)
Total Protein: 6.9 g/dL (ref 6.5–8.1)

## 2016-04-18 LAB — CBC
HCT: 31.9 % — ABNORMAL LOW (ref 36.0–46.0)
Hemoglobin: 10.4 g/dL — ABNORMAL LOW (ref 12.0–15.0)
MCH: 27.9 pg (ref 26.0–34.0)
MCHC: 32.6 g/dL (ref 30.0–36.0)
MCV: 85.5 fL (ref 78.0–100.0)
PLATELETS: 353 10*3/uL (ref 150–400)
RBC: 3.73 MIL/uL — AB (ref 3.87–5.11)
RDW: 14.5 % (ref 11.5–15.5)
WBC: 5.6 10*3/uL (ref 4.0–10.5)

## 2016-04-18 LAB — MAGNESIUM: Magnesium: 1.9 mg/dL (ref 1.7–2.4)

## 2016-04-18 MED ORDER — FUROSEMIDE 10 MG/ML IJ SOLN
20.0000 mg | Freq: Once | INTRAMUSCULAR | Status: AC
  Administered 2016-04-18: 20 mg via INTRAVENOUS
  Filled 2016-04-18: qty 2

## 2016-04-18 MED ORDER — BENZONATATE 100 MG PO CAPS
200.0000 mg | ORAL_CAPSULE | Freq: Three times a day (TID) | ORAL | Status: DC
  Administered 2016-04-18 – 2016-04-20 (×7): 200 mg via ORAL
  Filled 2016-04-18 (×7): qty 2

## 2016-04-18 NOTE — Progress Notes (Signed)
PROGRESS NOTE    Carol HelperMary L Haberland  ZOX:[id number] DOB: 03/28/1961 DOA: 04/14/2016 PCP: Bing PlumeHENLEY,THOMAS F, MD    Brief Narrative:   [name] is a 55 y.o. female with history of asthma presents to the ER because of persistent abdominal pain. Patient has been having abdominal pain mostly in the lower quadrants originating from the right flank area. Has been having nausea and poor appetite. Patient had come to the ER last week and at that time was diagnosed with UTI and was prescribed Keflex. Urine cultures showed Escherichia coli sensitive to ceftriaxone. Despite taking the antibiotics patient is still having the pain and since pain was persistent CT abdomen and pelvis was done which shows features concerning for pyelonephritis involving multiple areas of the right kidney. Since patient failed oral antibiotics and still has persistent abdominal pain, patient has been admitted for IV antibiotics and further observation. Denies any diarrhea or chest pain or shortness of breath.   Assessment & Plan:   Principal Problem:   Pyelonephritis Active Problems:   Normochromic normocytic anemia   Elevated LFTs  #1 multifocal right-sided pyelonephritis Patient with lower abdominal pain. Patient afebrile. Blood cultures pending. Urine cultures negative. Cultures from 04/09/2016 with greater than 100,000 Escherichia coli which was an extended ESBL was sensitive to the third generation cephalosporins, resistant to the penicillins and gentamicin and Zosyn and Unasyn. Patient was treated with Macrobid as well as oral Keflex which was a first generation cephalosporin. Discontinued Primaxin and narrowed down to IV Rocephin. Pain management. K pad. Follow.  #2 elevated LFTs CT scan negative for any gallbladder pathology. LFTs trending down and have improved. Acute hepatitis panel is negative. NSL IV fluids. Follow.  #3 dehydration   NSL IV fluids.  #4 normocytic anemia Follow H&H.  #5 history of  asthma Stable.  #6 cough/shortness of breath Check a chest x-ray. Place on [employer] Perles. Saline lock IV fluids. Lasix 20 mg IV 1.   DVT prophylaxis: SCDs Code Status: Full Family Communication: Updated patient. No family at bedside. Disposition Plan: Home when medically stable and improved with improvement with abdominal pain.   Consultants:   None  Procedures:   CT abdomen and pelvis 04/15/2016  Antimicrobials:   IV Primaxin 04/15/2016>>>>> 04/16/2016  IV Rocephin 04/16/2016   Subjective: Patient states slowly improving with lower abdominal pain. Patient complaining of nausea. Tolerating current diet. Ambulated in the hallway. Patient complaining of a cough. Patient also with some complaints of some minimal shortness of breath.  Objective: Vitals:   04/17/16 0600 04/17/16 1440 04/17/16 2137 04/18/16 0428  BP: 115/65 114/65 119/68 129/75  Pulse: 62 (!) 56 67 (!) 52  Resp: 16 16 15 16   Temp: 98 F (36.7 C) 97.9 F (36.6 C) 97.7 F (36.5 C) 97.7 F (36.5 C)  TempSrc: Oral Oral Oral Oral  SpO2: 100% 100% 96% 100%  Weight:      Height:        Intake/Output Summary (Last 24 hours) at 04/18/16 1216 Last data filed at 04/18/16 0600  Gross per 24 hour  Intake           1412.5 ml  Output                0 ml  Net           1412.5 ml   Filed Weights   04/14/16 1639  Weight: 78.2 kg (172 lb 6 oz)    Examination:  General exam: Appears calm and comfortable  Respiratory  system: Clear to auscultation. Respiratory effort normal. Cardiovascular system: S1 & S2 heard, RRR. No JVD, murmurs, rubs, gallops or clicks. No pedal edema. Gastrointestinal system: Abdomen is nondistended, soft and tender to palpation in the lower abdominal region. Some slight CVA tenderness to palpation. No organomegaly or masses felt. Normal bowel sounds heard. Central nervous system: Alert and oriented. No focal neurological deficits. Extremities: Symmetric 5 x 5 power. Skin: No rashes,  lesions or ulcers Psychiatry: Judgement and insight appear normal. Mood & affect appropriate.     Data Reviewed: I have personally reviewed following labs and imaging studies  CBC:  Recent Labs Lab 04/14/16 1750 04/15/16 0705 04/16/16 0540 04/17/16 0904 04/18/16 0503  WBC 6.4 6.0 6.4 4.9 5.6  NEUTROABS 3.1 3.4  --  3.0  --   HGB 10.9* 10.9* 11.4* 10.1* 10.4*  HCT 31.3* 32.7* 34.4* 30.5* 31.9*  MCV 80.5 84.3 84.5 85.2 85.5  PLT 278 275 309 333 353   Basic Metabolic Panel:  Recent Labs Lab 04/14/16 1750 04/15/16 0705 04/16/16 0540 04/17/16 0904 04/18/16 0503  NA 143 143 142 143 144  K 4.0 4.1 4.2 4.5 4.3  CL 109 110 112* 117* 111  CO2 26 26 24  20* 26  GLUCOSE 99 92 92 89 92  BUN 11 10 11 8 8   CREATININE 1.11* 1.05* 1.01* 0.77 0.88  CALCIUM 9.2 9.2 8.9 8.7* 9.3  MG  --   --  2.0  --  1.9   GFR: Estimated Creatinine Clearance: 75.6 mL/min (by C-G formula based on SCr of 0.88 mg/dL). Liver Function Tests:  Recent Labs Lab 04/15/16 0705 04/16/16 0540 04/18/16 0503  AST 31 24 19   ALT 61* 47 33  ALKPHOS 316* 272* 208*  BILITOT 0.7 0.5 0.8  PROT 7.2 7.3 6.9  ALBUMIN 3.3* 3.2* 3.2*   No results for input(s): LIPASE, AMYLASE in the last 168 hours. No results for input(s): AMMONIA in the last 168 hours. Coagulation Profile: No results for input(s): INR, PROTIME in the last 168 hours. Cardiac Enzymes: No results for input(s): CKTOTAL, CKMB, CKMBINDEX, TROPONINI in the last 168 hours. BNP (last 3 results) No results for input(s): PROBNP in the last 8760 hours. HbA1C: No results for input(s): HGBA1C in the last 72 hours. CBG: No results for input(s): GLUCAP in the last 168 hours. Lipid Profile: No results for input(s): CHOL, HDL, LDLCALC, TRIG, CHOLHDL, LDLDIRECT in the last 72 hours. Thyroid Function Tests: No results for input(s): TSH, T4TOTAL, FREET4, T3FREE, THYROIDAB in the last 72 hours. Anemia Panel: No results for input(s): VITAMINB12, FOLATE,  FERRITIN, TIBC, IRON, RETICCTPCT in the last 72 hours. Sepsis Labs: No results for input(s): PROCALCITON, LATICACIDVEN in the last 168 hours.  Recent Results (from the past 240 hour(s))  Urine culture     Status: Abnormal   Collection Time: 04/09/16  8:15 PM  Result Value Ref Range Status   Specimen Description URINE, CLEAN CATCH  Final   Special Requests NONE  Final   Culture >=100,000 COLONIES/mL ESCHERICHIA COLI (A)  Final   Report Status 04/12/2016 FINAL  Final   Organism ID, Bacteria ESCHERICHIA COLI (A)  Final      Susceptibility   Escherichia coli - MIC*    AMPICILLIN >=32 RESISTANT Resistant     CEFAZOLIN 16 SENSITIVE Sensitive     CEFTRIAXONE <=1 SENSITIVE Sensitive     CIPROFLOXACIN 1 SENSITIVE Sensitive     GENTAMICIN >=16 RESISTANT Resistant     IMIPENEM <=0.25 SENSITIVE Sensitive  NITROFURANTOIN <=16 SENSITIVE Sensitive     TRIMETH/SULFA >=320 RESISTANT Resistant     AMPICILLIN/SULBACTAM >=32 RESISTANT Resistant     PIP/TAZO >=128 RESISTANT Resistant     Extended ESBL NEGATIVE Sensitive     * >=100,000 COLONIES/mL ESCHERICHIA COLI  Urine culture     Status: None   Collection Time: 04/14/16  4:38 PM  Result Value Ref Range Status   Specimen Description URINE, RANDOM  Final   Special Requests NONE  Final   Culture   Final    NO GROWTH 1 DAY Performed at [facility]    Report Status 04/16/2016 FINAL  Final  Culture, blood (Routine X 2) w Reflex to ID Panel     Status: None (Preliminary result)   Collection Time: 04/15/16  4:15 AM  Result Value Ref Range Status   Specimen Description BLOOD LEFT ANTECUBITAL  Final   Special Requests BOTTLES DRAWN AEROBIC AND ANAEROBIC 5CC EA  Final   Culture   Final    NO GROWTH 2 DAYS Performed at [facility]    Report Status PENDING  Incomplete  Culture, blood (Routine X 2) w Reflex to ID Panel     Status: None (Preliminary result)   Collection Time: 04/15/16  4:15 AM  Result Value Ref Range Status    Specimen Description BLOOD BLOOD LEFT HAND  Final   Special Requests IN PEDIATRIC BOTTLE 3CC  Final   Culture   Final    NO GROWTH 2 DAYS Performed at [facility]    Report Status PENDING  Incomplete         Radiology Studies: No results found.      Scheduled Meds: . benzonatate  200 mg Oral TID  . cefTRIAXone (ROCEPHIN)  IV  2 g Intravenous Q24H  . senna-docusate  2 tablet Oral BID   Continuous Infusions:     LOS: 3 days    Time spent: 35 minutes    Karson Reede, MD [facility] Pager [phone]  If 7PM-7AM, please contact night-coverage [email or web address] Password [email or web address] 04/18/2016, 12:16 PM

## 2016-04-18 NOTE — Progress Notes (Signed)
Pt c/o nausea w/increased pain in rt flank. Notably shivering. Pt stated "I'm cold." VSS. Warm blanket provided with noted decrease in shivering. Pharmacy notified for Zofran 8 mg bolus. Reassurance given verbally and via touch.

## 2016-04-18 NOTE — Progress Notes (Addendum)
Pt sleeping soundly. No shivering noted. Husband at bedside. Dr. Janee Mornhompson made aware verbally of recent episode of shivering. No new orders received.

## 2016-04-18 NOTE — Progress Notes (Signed)
Pt was sitting in chair during shift change. When me and day shift nurse went into room to give report two while small pills fell to the floor beside the chair. Pt stated she did not have any medications from home in the room. Medications were identified as baclofen. Pt stated that she takes baclofen at home and they mustve fell out of husbands pocket when he was visiting earlier that day. We informed pt that she should never take any medications from home and that they could interfere with medications she is being given here. Pt stated she would not do that and she understood. MD was notified.  [name], [name]

## 2016-04-18 NOTE — Progress Notes (Signed)
Replacement Zofran IV bolus obtained from pharmacy. Pt awakened from deep sleep. Reported pain was low and denied nausea at this time. No shivering. Refused nausea med at this time.

## 2016-04-19 DIAGNOSIS — R0602 Shortness of breath: Secondary | ICD-10-CM

## 2016-04-19 LAB — CBC WITH DIFFERENTIAL/PLATELET
Basophils Absolute: 0 10*3/uL (ref 0.0–0.1)
Basophils Relative: 0 %
EOS ABS: 0.1 10*3/uL (ref 0.0–0.7)
Eosinophils Relative: 2 %
HEMATOCRIT: 31.1 % — AB (ref 36.0–46.0)
HEMOGLOBIN: 10.5 g/dL — AB (ref 12.0–15.0)
LYMPHS ABS: 1.5 10*3/uL (ref 0.7–4.0)
LYMPHS PCT: 28 %
MCH: 28.2 pg (ref 26.0–34.0)
MCHC: 33.8 g/dL (ref 30.0–36.0)
MCV: 83.6 fL (ref 78.0–100.0)
MONOS PCT: 5 %
Monocytes Absolute: 0.3 10*3/uL (ref 0.1–1.0)
NEUTROS PCT: 65 %
Neutro Abs: 3.6 10*3/uL (ref 1.7–7.7)
Platelets: 337 10*3/uL (ref 150–400)
RBC: 3.72 MIL/uL — AB (ref 3.87–5.11)
RDW: 14.1 % (ref 11.5–15.5)
WBC: 5.5 10*3/uL (ref 4.0–10.5)

## 2016-04-19 LAB — BASIC METABOLIC PANEL
Anion gap: 5 (ref 5–15)
BUN: 8 mg/dL (ref 6–20)
CHLORIDE: 110 mmol/L (ref 101–111)
CO2: 28 mmol/L (ref 22–32)
CREATININE: 0.95 mg/dL (ref 0.44–1.00)
Calcium: 9.2 mg/dL (ref 8.9–10.3)
GFR calc non Af Amer: >60 mL/min (ref >60)
Glucose, Bld: 92 mg/dL (ref 65–99)
POTASSIUM: 3.7 mmol/L (ref 3.5–5.1)
SODIUM: 143 mmol/L (ref 135–145)

## 2016-04-19 MED ORDER — KETOROLAC TROMETHAMINE 30 MG/ML IJ SOLN: 30.0000 mg | Freq: Four times a day (QID) | INTRAMUSCULAR | Status: DC | PRN

## 2016-04-19 MED ORDER — OXYCODONE-ACETAMINOPHEN 5-325 MG PO TABS: 1.0000 | ORAL_TABLET | ORAL | Status: DC | PRN

## 2016-04-19 MED ORDER — TRAMADOL HCL 50 MG PO TABS
100.0000 mg | ORAL_TABLET | Freq: Four times a day (QID) | ORAL | Status: DC | PRN
  Administered 2016-04-19 – 2016-04-20 (×2): 100 mg via ORAL
  Filled 2016-04-19 (×2): qty 2

## 2016-04-19 MED ORDER — FUROSEMIDE 10 MG/ML IJ SOLN
40.0000 mg | Freq: Once | INTRAMUSCULAR | Status: AC
  Administered 2016-04-19: 40 mg via INTRAVENOUS
  Filled 2016-04-19: qty 4

## 2016-04-19 MED ORDER — LORAZEPAM 2 MG/ML IJ SOLN
0.5000 mg | Freq: Three times a day (TID) | INTRAMUSCULAR | Status: DC | PRN
  Administered 2016-04-19: 0.5 mg via INTRAVENOUS
  Filled 2016-04-19: qty 1

## 2016-04-19 MED ORDER — ACETAMINOPHEN 500 MG PO TABS
500.0000 mg | ORAL_TABLET | Freq: Three times a day (TID) | ORAL | Status: DC
  Administered 2016-04-19 – 2016-04-20 (×3): 500 mg via ORAL
  Filled 2016-04-19 (×3): qty 1

## 2016-04-19 NOTE — Progress Notes (Signed)
PROGRESS NOTE    [name]  ZOX:[id number] DOB: 04/15/1961 DOA: 04/14/2016 PCP: Bing PlumeHENLEY,THOMAS F, MD    Brief Narrative:   [name] is a 55 y.o. female with history of asthma presents to the ER because of persistent abdominal pain. Patient has been having abdominal pain mostly in the lower quadrants originating from the right flank area. Has been having nausea and poor appetite. Patient had come to the ER last week and at that time was diagnosed with UTI and was prescribed Keflex. Urine cultures showed Escherichia coli sensitive to ceftriaxone. Despite taking the antibiotics patient is still having the pain and since pain was persistent CT abdomen and pelvis was done which shows features concerning for pyelonephritis involving multiple areas of the right kidney. Since patient failed oral antibiotics and still has persistent abdominal pain, patient has been admitted for IV antibiotics and further observation. Denies any diarrhea or chest pain or shortness of breath.   Assessment & Plan:   Principal Problem:   Pyelonephritis Active Problems:   Normochromic normocytic anemia   Elevated LFTs  #1 multifocal right-sided pyelonephritis Patient still with lower abdominal pain which is slowly improving. Patient afebrile. Blood cultures pending. Urine cultures negative. Cultures from 04/09/2016 with greater than 100,000 Escherichia coli which was an extended ESBL was sensitive to the third generation cephalosporins, resistant to the penicillins and gentamicin and Zosyn and Unasyn. Patient was treated with Macrobid as well as oral Keflex which was a first generation cephalosporin. Discontinued Primaxin and narrowed down to IV Rocephin. Pain management. K pad. Change pain medications to IV Toradol and Ultram. Follow.  #2 elevated LFTs CT scan negative for any gallbladder pathology. LFTs trending down and have improved. Acute hepatitis panel is negative. NSL IV fluids. Follow.  #3 dehydration     NSL IV fluids.  #4 normocytic anemia Follow H&H.  #5 history of asthma Stable.  #6 cough/shortness of breath Chest x-ray unremarkable. Coughing improved. Continue Tessalon Perles.    DVT prophylaxis: SCDs Code Status: Full Family Communication: Updated patient. No family at bedside. Disposition Plan: Home when medically stable and improved with improvement with abdominal pain.   Consultants:   None  Procedures:   CT abdomen and pelvis 04/15/2016  Antimicrobials:   IV Primaxin 04/15/2016>>>>> 04/16/2016  IV Rocephin 04/16/2016   Subjective: Patient states slowly improving with lower abdominal pain.Tolerating current diet. Ambulated in the hallway. Patient complaining of feeling jittery. She feels well be from the pain medication. Cough seems to be improved.   Objective: Vitals:   04/18/16 0428 04/18/16 1403 04/18/16 1658 04/18/16 2120  BP: 129/75 112/64 128/75 133/68  Pulse: (!) 52 (!) 56 74 65  Resp: 16 14 20 16   Temp: 97.7 F (36.5 C) 97.8 F (36.6 C) 97.7 F (36.5 C) 98.1 F (36.7 C)  TempSrc: Oral Oral Oral Oral  SpO2: 100% 100% 100% 100%  Weight:      Height:        Intake/Output Summary (Last 24 hours) at 04/19/16 1144 Last data filed at 04/19/16 0940  Gross per 24 hour  Intake              360 ml  Output                0 ml  Net              360 ml   Filed Weights   04/14/16 1639  Weight: 78.2 kg (172 lb 6 oz)  Examination:  General exam: Appears calm and comfortable  Respiratory system: Clear to auscultation. Respiratory effort normal. Cardiovascular system: S1 & S2 heard, RRR. No JVD, murmurs, rubs, gallops or clicks. No pedal edema. Gastrointestinal system: Abdomen is nondistended, soft and tender to palpation in the lower abdominal region. Some slight CVA tenderness to palpation. No organomegaly or masses felt. Normal bowel sounds heard. Central nervous system: Alert and oriented. No focal neurological deficits. Extremities:  Symmetric 5 x 5 power. Skin: No rashes, lesions or ulcers Psychiatry: Judgement and insight appear normal. Mood & affect appropriate.     Data Reviewed: I have personally reviewed following labs and imaging studies  CBC:  Recent Labs Lab 04/14/16 1750 04/15/16 0705 04/16/16 0540 04/17/16 0904 04/18/16 0503 04/19/16 0900  WBC 6.4 6.0 6.4 4.9 5.6 5.5  NEUTROABS 3.1 3.4  --  3.0  --  3.6  HGB 10.9* 10.9* 11.4* 10.1* 10.4* 10.5*  HCT 31.3* 32.7* 34.4* 30.5* 31.9* 31.1*  MCV 80.5 84.3 84.5 85.2 85.5 83.6  PLT 278 275 309 333 353 337   Basic Metabolic Panel:  Recent Labs Lab 04/15/16 0705 04/16/16 0540 04/17/16 0904 04/18/16 0503 04/19/16 0900  NA 143 142 143 144 143  K 4.1 4.2 4.5 4.3 3.7  CL 110 112* 117* 111 110  CO2 26 24 20* 26 28  GLUCOSE 92 92 89 92 92  BUN 10 11 8 8 8   CREATININE 1.05* 1.01* 0.77 0.88 0.95  CALCIUM 9.2 8.9 8.7* 9.3 9.2  MG  --  2.0  --  1.9  --    GFR: Estimated Creatinine Clearance: 70 mL/min (by C-G formula based on SCr of 0.95 mg/dL). Liver Function Tests:  Recent Labs Lab 04/15/16 0705 04/16/16 0540 04/18/16 0503  AST 31 24 19   ALT 61* 47 33  ALKPHOS 316* 272* 208*  BILITOT 0.7 0.5 0.8  PROT 7.2 7.3 6.9  ALBUMIN 3.3* 3.2* 3.2*   No results for input(s): LIPASE, AMYLASE in the last 168 hours. No results for input(s): AMMONIA in the last 168 hours. Coagulation Profile: No results for input(s): INR, PROTIME in the last 168 hours. Cardiac Enzymes: No results for input(s): CKTOTAL, CKMB, CKMBINDEX, TROPONINI in the last 168 hours. BNP (last 3 results) No results for input(s): PROBNP in the last 8760 hours. HbA1C: No results for input(s): HGBA1C in the last 72 hours. CBG: No results for input(s): GLUCAP in the last 168 hours. Lipid Profile: No results for input(s): CHOL, HDL, LDLCALC, TRIG, CHOLHDL, LDLDIRECT in the last 72 hours. Thyroid Function Tests: No results for input(s): TSH, T4TOTAL, FREET4, T3FREE, THYROIDAB in the  last 72 hours. Anemia Panel: No results for input(s): VITAMINB12, FOLATE, FERRITIN, TIBC, IRON, RETICCTPCT in the last 72 hours. Sepsis Labs: No results for input(s): PROCALCITON, LATICACIDVEN in the last 168 hours.  Recent Results (from the past 240 hour(s))  Urine culture     Status: Abnormal   Collection Time: 04/09/16  8:15 PM  Result Value Ref Range Status   Specimen Description URINE, CLEAN CATCH  Final   Special Requests NONE  Final   Culture >=100,000 COLONIES/mL ESCHERICHIA COLI (A)  Final   Report Status 04/12/2016 FINAL  Final   Organism ID, Bacteria ESCHERICHIA COLI (A)  Final      Susceptibility   Escherichia coli - MIC*    AMPICILLIN >=32 RESISTANT Resistant     CEFAZOLIN 16 SENSITIVE Sensitive     CEFTRIAXONE <=1 SENSITIVE Sensitive     CIPROFLOXACIN 1 SENSITIVE Sensitive  GENTAMICIN >=16 RESISTANT Resistant     IMIPENEM <=0.25 SENSITIVE Sensitive     NITROFURANTOIN <=16 SENSITIVE Sensitive     TRIMETH/SULFA >=320 RESISTANT Resistant     AMPICILLIN/SULBACTAM >=32 RESISTANT Resistant     PIP/TAZO >=128 RESISTANT Resistant     Extended ESBL NEGATIVE Sensitive     * >=100,000 COLONIES/mL ESCHERICHIA COLI  Urine culture     Status: None   Collection Time: 04/14/16  4:38 PM  Result Value Ref Range Status   Specimen Description URINE, RANDOM  Final   Special Requests NONE  Final   Culture   Final    NO GROWTH 1 DAY Performed at [facility]    Report Status 04/16/2016 FINAL  Final  Culture, blood (Routine X 2) w Reflex to ID Panel     Status: None (Preliminary result)   Collection Time: 04/15/16  4:15 AM  Result Value Ref Range Status   Specimen Description BLOOD LEFT ANTECUBITAL  Final   Special Requests BOTTLES DRAWN AEROBIC AND ANAEROBIC 5CC EA  Final   Culture   Final    NO GROWTH 3 DAYS Performed at [facility]    Report Status PENDING  Incomplete  Culture, blood (Routine X 2) w Reflex to ID Panel     Status: None (Preliminary result)     Collection Time: 04/15/16  4:15 AM  Result Value Ref Range Status   Specimen Description BLOOD BLOOD LEFT HAND  Final   Special Requests IN PEDIATRIC BOTTLE 3CC  Final   Culture   Final    NO GROWTH 3 DAYS Performed at [facility]    Report Status PENDING  Incomplete         Radiology Studies: Dg Chest 2 View  Result Date: 04/18/2016 CLINICAL DATA:  Cough, shortness of Breath EXAM: CHEST  2 VIEW COMPARISON:  07/13/13 FINDINGS: Cardiomediastinal silhouette is stable. No acute infiltrate or pleural effusion. No pulmonary edema. Bony thorax is unremarkable. IMPRESSION: No active cardiopulmonary disease. Electronically Signed   By: Natasha Mead M.D.   On: 04/18/2016 13:49        Scheduled Meds: . acetaminophen  500 mg Oral TID  . benzonatate  200 mg Oral TID  . cefTRIAXone (ROCEPHIN)  IV  2 g Intravenous Q24H  . senna-docusate  2 tablet Oral BID   Continuous Infusions:     LOS: 4 days    Time spent: 35 minutes    Zerenity Bowron, MD [facility] Pager [phone]  If 7PM-7AM, please contact night-coverage [email or web address] Password [email or web address] 04/19/2016, 11:44 AM

## 2016-04-20 LAB — BASIC METABOLIC PANEL
Anion gap: 8 (ref 5–15)
BUN: 11 mg/dL (ref 6–20)
CHLORIDE: 104 mmol/L (ref 101–111)
CO2: 30 mmol/L (ref 22–32)
CREATININE: 1 mg/dL (ref 0.44–1.00)
Calcium: 9.2 mg/dL (ref 8.9–10.3)
GFR calc Af Amer: >60 mL/min (ref >60)
GFR calc non Af Amer: >60 mL/min (ref >60)
GLUCOSE: 99 mg/dL (ref 65–99)
POTASSIUM: 3.3 mmol/L — AB (ref 3.5–5.1)
SODIUM: 142 mmol/L (ref 135–145)

## 2016-04-20 LAB — CULTURE, BLOOD (ROUTINE X 2)
CULTURE: NO GROWTH
Culture: NO GROWTH

## 2016-04-20 MED ORDER — POTASSIUM CHLORIDE CRYS ER 20 MEQ PO TBCR
40.0000 meq | EXTENDED_RELEASE_TABLET | Freq: Once | ORAL | Status: AC
  Administered 2016-04-20: 40 meq via ORAL
  Filled 2016-04-20: qty 2

## 2016-04-20 MED ORDER — SENNOSIDES-DOCUSATE SODIUM 8.6-50 MG PO TABS: 2.0000 | ORAL_TABLET | Freq: Two times a day (BID) | ORAL | Status: DC

## 2016-04-20 MED ORDER — CIPROFLOXACIN HCL 500 MG PO TABS: 500.0000 mg | ORAL_TABLET | Freq: Two times a day (BID) | ORAL | 0 refills | Status: AC

## 2016-04-20 MED ORDER — FLUCONAZOLE 150 MG PO TABS: 150.0000 mg | ORAL_TABLET | Freq: Every day | ORAL | 0 refills | Status: AC

## 2016-04-20 MED ORDER — BENZONATATE 200 MG PO CAPS: 200.0000 mg | ORAL_CAPSULE | Freq: Three times a day (TID) | ORAL | 0 refills | Status: AC

## 2016-04-20 MED ORDER — ONDANSETRON 4 MG PO TBDP: 4.0000 mg | ORAL_TABLET | Freq: Three times a day (TID) | ORAL | 0 refills | Status: DC | PRN

## 2016-04-20 MED ORDER — TRAMADOL HCL 50 MG PO TABS: 100.0000 mg | ORAL_TABLET | Freq: Four times a day (QID) | ORAL | 0 refills | Status: DC | PRN

## 2016-04-20 NOTE — Progress Notes (Signed)
Pt ws given prescriptions and discharge orders were explained. Pt had no additional questions. RN met patients family at main entrance to go home. [place]

## 2016-04-20 NOTE — Discharge Summary (Addendum)
Physician Discharge Summary  [name] LGX:[id number] DOB: 12/31/1960 DOA: 04/14/2016  PCP: Bing Plume, MD  Admit date: 04/14/2016 Discharge date: 04/20/2016  Time spent: 60 minutes  Recommendations for Outpatient Follow-up:  1. Follow-up with Bing Plume, MD in 1-2 weeks.    Discharge Diagnoses:  Principal Problem:   Pyelonephritis Active Problems:   Normochromic normocytic anemia   Elevated LFTs   SOB (shortness of breath)   Discharge Condition: Stable and improved  Diet recommendation: Regular  Filed Weights   04/14/16 1639 04/19/16 2008  Weight: 78.2 kg (172 lb 6 oz) 79.7 kg (175 lb 9.6 oz)    History of present illness:  Per Dr Val Riles is a 55 y.o. female with history of asthma presented to the ER because of persistent abdominal pain. Patient had been having abdominal pain mostly in the lower quadrants originating from the right flank area. Had been having nausea and poor appetite. Patient had come to the ER last week and at that time was diagnosed with UTI and was prescribed Keflex. Urine cultures showed Escherichia coli sensitive to ceftriaxone. Despite taking the antibiotics patient was still having the pain and since pain was persistent CT abdomen and pelvis was done which showed features concerning for pyelonephritis involving multiple areas of the right kidney. Since patient failed oral antibiotics and still had persistent abdominal pain, patient has been admitted for IV antibiotics and further observation. Denied any diarrhea or chest pain or shortness of breath.  ED Course: Was given IV ceftriaxone in the ER.  Hospital Course:  #1 multifocal right-sided pyelonephritis Patient Patient was admitted with multifocal right-sided pyelonephritis which had failed outpatient treatment. Patient was admitted placed on IV fluids and initially empirically on IV Primaxin. Patient was pancultured and review repeat urinalysis and urine cultures were  obtained. Patient remained afebrile urine cultures came back negative. IV antibiotics was subsequently narrowed to IV Rocephin. Patient was also placed on pain management. Patient remained afebrile throughout the hospitalization. Patient had a normal white count. Patient slowly improved.  Urine cultures from 04/09/2016 with greater than 100,000 Escherichia coli which  was sensitive to the third generation cephalosporins, resistant to the penicillins and gentamicin and Zosyn and Unasyn. Patient was treated with Macrobid as well as oral Keflex in the outpatient setting prior to admission which was a first generation cephalosporin. Patient seemed to improve slowly on IV Rocephin. Patient was initially placed on IV morphine however felt was causing it to be jittery and was transitioned to IV Toradol and Ultram which she tolerated. Patient will be discharged home on 10 more days of oral ciprofloxacin to complete a two-week course of antibiotic treatment. Outpatient follow-up with PCP.   #2 elevated LFTs CT scan negative for any gallbladder pathology. LFTs trended down and had resolved by day of discharge. Acute hepatitis panel was negative.  #3 dehydration    patient on admission was noted to be dehydrated. Patient was hydrated with IV fluids which was subsequently discontinued. Patient was euvolemic by day of discharge.   #4 normocytic anemia Follow H&H.  #5 history of asthma Stable.  #6 cough/shortness of breath Chest x-ray unremarkable. Patient was placed on Tessalon Perles with improvement of cough. Shortness of breath improved.   Procedures:  CT abdomen and pelvis 04/15/2016   Consultations:  None  Discharge Exam: Vitals:   04/20/16 0525 04/20/16 1359  BP: 139/76 138/65  Pulse: (!) 56 66  Resp: 16 18  Temp: 98.1 F (36.7 C) 98.1 F (36.7  C)    General: NAD Cardiovascular: RRR Respiratory: CTAB  Discharge Instructions   Discharge Instructions    Diet general     Complete by:  As directed    Increase activity slowly    Complete by:  As directed      Current Discharge Medication List    START taking these medications   Details  benzonatate (TESSALON) 200 MG capsule Take 1 capsule (200 mg total) by mouth 3 (three) times daily. Take for 4 days then stop. Qty: 12 capsule, Refills: 0    ciprofloxacin (CIPRO) 500 MG tablet Take 1 tablet (500 mg total) by mouth 2 (two) times daily. Qty: 20 tablet, Refills: 0    fluconazole (DIFLUCAN) 150 MG tablet Take 1 tablet (150 mg total) by mouth daily. Qty: 1 tablet, Refills: 0    ondansetron (ZOFRAN-ODT) 4 MG disintegrating tablet Take 1 tablet (4 mg total) by mouth every 8 (eight) hours as needed for nausea or vomiting. Qty: 20 tablet, Refills: 0    senna-docusate (SENOKOT-S) 8.6-50 MG tablet Take 2 tablets by mouth 2 (two) times daily.    traMADol (ULTRAM) 50 MG tablet Take 2 tablets (100 mg total) by mouth every 6 (six) hours as needed for moderate pain. Qty: 30 tablet, Refills: 0      CONTINUE these medications which have NOT CHANGED   Details  albuterol (PROVENTIL) (2.5 MG/3ML) 0.083% nebulizer solution Take 2.5 mg by nebulization every 4 (four) hours as needed for wheezing or shortness of breath.    aspirin EC 325 MG tablet Take 1 tablet (325 mg total) by mouth 2 (two) times daily after a meal. Qty: 60 tablet, Refills: 1    PROVENTIL HFA 108 (90 Base) MCG/ACT inhaler INHALE 2 PUFFS BY MOUTH INTO THE LUNGS EVERY 4 HOURS AS NEEDED FOR WHEEZING OR SHORTNESS OF BREATH Qty: 6.7 g, Refills: 0      STOP taking these medications     cephALEXin (KEFLEX) 500 MG capsule      HYDROcodone-acetaminophen (NORCO/VICODIN) 5-325 MG per tablet      methocarbamol (ROBAXIN) 500 MG tablet      oxyCODONE-acetaminophen (PERCOCET/ROXICET) 5-325 MG tablet        Allergies  Allergen Reactions  . Doxycycline Anaphylaxis  . Metoclopramide Hcl Shortness Of Breath and Swelling  . Prochlorperazine Edisylate  Anaphylaxis   Follow-up Information    Bing Plume, MD. Schedule an appointment as soon as possible for a visit in 2 week(s).   Specialty:  Obstetrics and Gynecology Why:  f/u in 1-2 weeks. Contact information: [address] [place] [address] [phone]            The results of significant diagnostics from this hospitalization (including imaging, microbiology, ancillary and laboratory) are listed below for reference.    Significant Diagnostic Studies: Dg Chest 2 View  Result Date: 04/18/2016 CLINICAL DATA:  Cough, shortness of Breath EXAM: CHEST  2 VIEW COMPARISON:  07/13/13 FINDINGS: Cardiomediastinal silhouette is stable. No acute infiltrate or pleural effusion. No pulmonary edema. Bony thorax is unremarkable. IMPRESSION: No active cardiopulmonary disease. Electronically Signed   By: Natasha Mead M.D.   On: 04/18/2016 13:49   Ct Abdomen Pelvis W Contrast  Result Date: 04/15/2016 CLINICAL DATA:  Acute onset of severe lower abdominal pain, worse on the right. Gross hematuria. Initial encounter. EXAM: CT ABDOMEN AND PELVIS WITH CONTRAST TECHNIQUE: Multidetector CT imaging of the abdomen and pelvis was performed using the standard protocol following bolus administration of intravenous  contrast. CONTRAST:  ISOVUE-300 IOPAMIDOL (ISOVUE-300) INJECTION 61% COMPARISON:  None. FINDINGS: Lower chest: Minimal bibasilar atelectasis is noted. Mild bibasilar bronchiectasis is seen. Hepatobiliary: The liver is unremarkable in appearance. The gallbladder is unremarkable in appearance. The common bile duct remains normal in caliber. Pancreas: The pancreas is within normal limits. Spleen: The spleen is unremarkable in appearance. Adrenals/Urinary Tract: The adrenal glands are unremarkable in appearance. Multiple areas of mildly decreased enhancement are noted at the right kidney, compatible with multifocal right-sided pyelonephritis. There is no evidence of  hydronephrosis. No renal or ureteral stones are identified. No perinephric stranding is seen. Stomach/Bowel: The stomach is unremarkable in appearance. The small bowel is within normal limits. The appendix is normal in caliber, without evidence of appendicitis. The colon is unremarkable in appearance Scattered diverticulosis is noted along the descending and proximal sigmoid colon, without evidence of diverticulitis. Vascular/Lymphatic: The abdominal aorta is unremarkable in appearance. The inferior vena cava is grossly unremarkable. No retroperitoneal lymphadenopathy is seen. No pelvic sidewall lymphadenopathy is identified. Reproductive: The bladder is mildly distended and grossly unremarkable. The patient is status post hysterectomy. No suspicious adnexal masses are seen. Other: No additional soft tissue abnormalities are seen. Musculoskeletal: No acute osseous abnormalities are identified. Mild disc space narrowing and vacuum phenomenon are noted at L4-L5. The visualized musculature is unremarkable in appearance. IMPRESSION: 1. Multiple areas of decreased enhancement at the right kidney, suspicious for multifocal right-sided pyelonephritis. 2. Mild bibasilar bronchiectasis noted. 3. Scattered diverticulosis along the descending and proximal sigmoid colon, without evidence of diverticulitis. Electronically Signed   By: Roanna Raider M.D.   On: 04/15/2016 02:09    Microbiology: Recent Results (from the past 240 hour(s))  Urine culture     Status: None   Collection Time: 04/14/16  4:38 PM  Result Value Ref Range Status   Specimen Description URINE, RANDOM  Final   Special Requests NONE  Final   Culture   Final    NO GROWTH 1 DAY Performed at [facility]    Report Status 04/16/2016 FINAL  Final  Culture, blood (Routine X 2) w Reflex to ID Panel     Status: None   Collection Time: 04/15/16  4:15 AM  Result Value Ref Range Status   Specimen Description BLOOD LEFT ANTECUBITAL  Final    Special Requests BOTTLES DRAWN AEROBIC AND ANAEROBIC 5CC EA  Final   Culture   Final    NO GROWTH 5 DAYS Performed at [facility]    Report Status 04/20/2016 FINAL  Final  Culture, blood (Routine X 2) w Reflex to ID Panel     Status: None   Collection Time: 04/15/16  4:15 AM  Result Value Ref Range Status   Specimen Description BLOOD BLOOD LEFT HAND  Final   Special Requests IN PEDIATRIC BOTTLE 3CC  Final   Culture   Final    NO GROWTH 5 DAYS Performed at [facility]    Report Status 04/20/2016 FINAL  Final     Labs: Basic Metabolic Panel:  Recent Labs Lab 04/16/16 0540 04/17/16 0904 04/18/16 0503 04/19/16 0900 04/20/16 0438  NA 142 143 144 143 142  K 4.2 4.5 4.3 3.7 3.3*  CL 112* 117* 111 110 104  CO2 24 20* 26 28 30   GLUCOSE 92 89 92 92 99  BUN 11 8 8 8 11   CREATININE 1.01* 0.77 0.88 0.95 1.00  CALCIUM 8.9 8.7* 9.3 9.2 9.2  MG 2.0  --  1.9  --   --  Liver Function Tests:  Recent Labs Lab 04/15/16 0705 04/16/16 0540 04/18/16 0503  AST 31 24 19   ALT 61* 47 33  ALKPHOS 316* 272* 208*  BILITOT 0.7 0.5 0.8  PROT 7.2 7.3 6.9  ALBUMIN 3.3* 3.2* 3.2*   No results for input(s): LIPASE, AMYLASE in the last 168 hours. No results for input(s): AMMONIA in the last 168 hours. CBC:  Recent Labs Lab 04/14/16 1750 04/15/16 0705 04/16/16 0540 04/17/16 0904 04/18/16 0503 04/19/16 0900  WBC 6.4 6.0 6.4 4.9 5.6 5.5  NEUTROABS 3.1 3.4  --  3.0  --  3.6  HGB 10.9* 10.9* 11.4* 10.1* 10.4* 10.5*  HCT 31.3* 32.7* 34.4* 30.5* 31.9* 31.1*  MCV 80.5 84.3 84.5 85.2 85.5 83.6  PLT 278 275 309 333 353 337   Cardiac Enzymes: No results for input(s): CKTOTAL, CKMB, CKMBINDEX, TROPONINI in the last 168 hours. BNP: BNP (last 3 results) No results for input(s): BNP in the last 8760 hours.  ProBNP (last 3 results) No results for input(s): PROBNP in the last 8760 hours.  CBG: No results for input(s): GLUCAP in the last 168  hours.     SignedRamiro Harvest:  THOMPSON,DANIEL MD.  [facility] 04/20/2016, 5:28 PM

## 2016-05-13 ENCOUNTER — Ambulatory Visit (INDEPENDENT_AMBULATORY_CARE_PROVIDER_SITE_OTHER): Payer: Self-pay | Admitting: Specialist

## 2016-05-19 ENCOUNTER — Other Ambulatory Visit: Payer: Self-pay | Admitting: Obstetrics and Gynecology

## 2016-05-19 DIAGNOSIS — N6320 Unspecified lump in the left breast, unspecified quadrant: Secondary | ICD-10-CM

## 2016-05-24 ENCOUNTER — Ambulatory Visit
Admission: RE | Admit: 2016-05-24 | Discharge: 2016-05-24 | Disposition: A | Discharge status: Home / Self Care | Payer: PRIVATE HEALTH INSURANCE | Source: Ambulatory Visit | Attending: Obstetrics and Gynecology | Admitting: Obstetrics and Gynecology

## 2016-05-24 DIAGNOSIS — N6320 Unspecified lump in the left breast, unspecified quadrant: Secondary | ICD-10-CM

## 2016-07-31 ENCOUNTER — Other Ambulatory Visit (INDEPENDENT_AMBULATORY_CARE_PROVIDER_SITE_OTHER): Payer: Self-pay | Admitting: Specialist

## 2016-08-17 NOTE — Telephone Encounter (Signed)
Baclofen refill

## 2017-05-03 ENCOUNTER — Ambulatory Visit: Payer: Self-pay | Admitting: Allergy and Immunology

## 2017-05-10 ENCOUNTER — Encounter: Payer: Self-pay | Admitting: Allergy and Immunology

## 2017-05-10 ENCOUNTER — Ambulatory Visit: Payer: PRIVATE HEALTH INSURANCE | Admitting: Allergy and Immunology

## 2017-05-10 VITALS — BP 114/70 | HR 73 | Temp 98.0°F | Resp 16 | Ht 64.0 in | Wt 184.0 lb

## 2017-05-10 DIAGNOSIS — J4520 Mild intermittent asthma, uncomplicated: Secondary | ICD-10-CM

## 2017-05-10 DIAGNOSIS — T7800XA Anaphylactic reaction due to unspecified food, initial encounter: Secondary | ICD-10-CM

## 2017-05-10 DIAGNOSIS — J383 Other diseases of vocal cords: Secondary | ICD-10-CM

## 2017-05-10 MED ORDER — DEXLANSOPRAZOLE 60 MG PO CPDR: 60.0000 mg | DELAYED_RELEASE_CAPSULE | Freq: Every day | ORAL | 5 refills | Status: DC

## 2017-05-10 MED ORDER — MOMETASONE FUROATE 220 MCG/INH IN AEPB: 1.0000 | INHALATION_SPRAY | Freq: Every day | RESPIRATORY_TRACT | 5 refills | Status: DC

## 2017-05-10 MED ORDER — EPINEPHRINE 0.3 MG/0.3ML IJ SOAJ: 0.3000 mg | Freq: Once | INTRAMUSCULAR | 1 refills | Status: AC

## 2017-05-10 NOTE — Patient Instructions (Addendum)
  1.  Allergen avoidance measures  2. Auvi-Q 0.3, Benadryl, MD/ER evaluation for allergic reaction  3.  Proventil HFA 2 puffs or albuterol nebulization every 4-6 hours if needed  4.  Blood -shellfish panel, fish panel  5.  Evaluation at [facility]?  6.  Preventative therapy:   A.  Asmanex 220 Twisthaler 1 inhalation 1 time per day  B.  Dexilant 60 mg 1 tablet 1 time per day  7.  Return to clinic in 4 weeks or earlier if problem  8.

## 2017-05-10 NOTE — Progress Notes (Signed)
Dear Dr. Ambrose MantleHenley,  Thank you for referring [name] to the [facility] on 05/10/2017.   Below is a summation of this patient's evaluation and recommendations.  Thank you for your referral. I will keep you informed about this patient's response to treatment.   If you have any questions please do not hesitate to contact me.   Sincerely,  Jessica PriestEric J. Hyacinth Marcelli, MD Allergy / Immunology [facility]   ______________________________________________________________________    NEW PATIENT NOTE  Referring Provider: Tracey HarriesHenley, Thomas, MD Primary Provider: Tracey HarriesHenley, Thomas, MD Date of office visit: 05/10/2017    Subjective:   Chief Complaint:  [name] (DOB: [birth year]) is a 56 y.o. female who presents to the clinic on 05/10/2017 with a chief complaint of Asthma and Cough .     HPI: [name] presents to this clinic in evaluation of 2 main issues.  First, about 1 year ago she ate scallops and developed acute onset of inability to breathe and air hunger and throat closing associated with facial tingling and dizziness and nausea for which she went to the urgent care center and subsequently ended up in the emergency room and was treated successfully with some unknown therapy.  About 1 month ago she ate salmon and developed the exact same scenario although she did not end up in the urgent care center or emergency room for that event.  Otherwise, she can eat all forms of food with no difficulty at all.  She never had any associated systemic or constitutional symptoms with this reaction.  Second, she has been diagnosed with asthma and has been under the care of Dr. Fannie Kneelint Young since 1998.  Her asthma is manifested as unrelenting coughing spells where she cannot speak and she sometimes feels as though she cannot get air into her chest because her throat is closing up.  She will use a short acting bronchodilator  whether that be by a MDI formulation or albuterol nebulization and she is not really sure that this helps very much.  She does have a history of the cold air induced coughing but no exercise-induced coughing.  She believes that triggers include the development of a head cold, weather changes, exposure to various smells and perfumes.  She does not have any defect in her ability to swallow.  She does not have any symptoms suggesting reflux disease.  She does not consume caffeine or eat chocolate or smoke.  She did visit with a local ENT physician, name unknown, several years ago who told her that she had some type of a problem with her throat and recommended speech therapy which she did not complete.  Past Medical History:  Diagnosis Date  . Anxiety   . Asthma   . Chronic kidney disease   . Complication of anesthesia    was slow to wake up with one of her surgeries  . Cough   . Headache    hx. migraines  . Hyperventilation   . Insomnia   . Pleurisy   . UTI (urinary tract infection)     Past Surgical History:  Procedure Laterality Date  . LUMBAR SPINE SURGERY    . UTERINE FIBROID SURGERY    . VESICOVAGINAL FISTULA CLOSURE W/ TAH      Allergies as of 05/10/2017      Reactions   Doxycycline Anaphylaxis   Metoclopramide Hcl Shortness Of Breath, Swelling   Prochlorperazine Edisylate Anaphylaxis  Compazine  [prochlorperazine Edisylate]    Erythromycin Base    Macrobid  [nitrofurantoin Macrocrystal]       Medication List      CALCIUM 1200 PO Take by mouth.   Iron 325 (65 Fe) MG Tabs Take by mouth.   PROBIOTIC DAILY PO Take by mouth.   PROVENTIL HFA 108 (90 Base) MCG/ACT inhaler Generic drug:  albuterol INHALE 2 PUFFS BY MOUTH INTO THE LUNGS EVERY 4 HOURS AS NEEDED FOR WHEEZING OR SHORTNESS OF BREATH   VITAMIN D PO Take by mouth.       Review of systems negative except as noted in HPI / PMHx or noted below:  Review of Systems  Constitutional: Negative.   HENT:  Negative.   Eyes: Negative.   Respiratory: Negative.   Cardiovascular: Negative.   Gastrointestinal: Negative.   Genitourinary: Negative.   Musculoskeletal: Negative.   Skin: Negative.   Neurological: Negative.   Endo/Heme/Allergies: Negative.   Psychiatric/Behavioral: Negative.     Family History  Problem Relation Age of Onset  . Prostate cancer Father   . Allergic rhinitis Mother   . Angioedema Neg Hx   . Eczema Neg Hx   . Urticaria Neg Hx   . Asthma Neg Hx     Social History   Socioeconomic History  . Marital status: Single    Spouse name: Not on file  . Number of children: 0  . Years of education: Not on file  . Highest education level: Not on file  Social Needs  . Financial resource strain: Not on file  . Food insecurity - worry: Not on file  . Food insecurity - inability: Not on file  . Transportation needs - medical: Not on file  . Transportation needs - non-medical: Not on file  Occupational History  . Occupation: marketing/sales  Tobacco Use  . Smoking status: Never Smoker  . Smokeless tobacco: Never Used  Substance and Sexual Activity  . Alcohol use: No  . Drug use: No  . Sexual activity: Not on file  Other Topics Concern  . Not on file  Social History Narrative  . Not on file    Environmental and Social history  Married lives in a townhouse with a dry environment, no animals located inside the household, carpet in the bedroom, no plastic on the bed, no plastic on the pillow, and no smokers located inside the household.  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  Objective:   Vitals:   05/10/17 1351  BP: 114/70  Pulse: 73  Resp: 16  Temp: 98 F (36.7 C)  SpO2: 98%   Height: 5\' 4"  (162.6 cm) Weight: 184 lb (83.5 kg)  Physical Exam  Constitutional: She is well-developed, well-nourished, and in no distress.  Raspy oscillating voice, throat clearing, barky cough  HENT:  Head: Normocephalic. Head is without right  periorbital erythema and without left periorbital erythema.  Right Ear: Tympanic membrane, external ear and ear canal normal.  Left Ear: Tympanic membrane, external ear and ear canal normal.  Nose: Nose normal. No mucosal edema or rhinorrhea.  Mouth/Throat: Uvula is midline, oropharynx is clear and moist and mucous membranes are normal. No oropharyngeal exudate.  Eyes: Conjunctivae and lids are normal. Pupils are equal, round, and reactive to light.  Neck: Trachea normal. No tracheal tenderness present. No tracheal deviation present. No thyromegaly present.  Cardiovascular: Normal rate, regular rhythm, S1 normal, S2 normal and normal heart sounds.  No murmur heard. Pulmonary/Chest: Effort normal and  breath sounds normal. No stridor. No tachypnea. No respiratory distress. She has no wheezes. She has no rales. She exhibits no tenderness.  Abdominal: Soft. She exhibits no distension and no mass. There is no hepatosplenomegaly. There is no tenderness. There is no rebound and no guarding.  Musculoskeletal: She exhibits no edema or tenderness.  Lymphadenopathy:       Head (right side): No tonsillar adenopathy present.       Head (left side): No tonsillar adenopathy present.    She has no cervical adenopathy.    She has no axillary adenopathy.  Neurological: She is alert. Gait normal.  Skin: No rash noted. She is not diaphoretic. No erythema. No pallor. Nails show no clubbing.  Psychiatric: Mood and affect normal.    Diagnostics: Allergy skin tests were performed.  She demonstrated hypersensitivity to house dust mite.  She did not demonstrate any hypersensitivity against a screening panel of foods.  Spirometry was performed and demonstrated an FEV1 of 2.26 @ 98 % of predicted. FEV1/FVC = 0.89.  Following the administration of nebulized albuterol her FEV1 rose to 2.43 which was an increase in the FEV1 of 8%.  Assessment and Plan:    1. Anaphylactic shock due to food, initial encounter   2.  Vocal cord dysfunction   3. Asthma, mild intermittent, well-controlled     1.  Allergen avoidance measures  2. Auvi-Q 0.3, Benadryl, MD/ER evaluation for allergic reaction  3.  Proventil HFA 2 puffs or albuterol nebulization every 4-6 hours if needed  4.  Blood -shellfish panel, fish panel  5.  Evaluation at [facility]?  6.  Preventative therapy:   A.  Asmanex 220 Twisthaler 1 inhalation 1 time per day  B.  Dexilant 60 mg 1 tablet 1 time per day  7.  Return to clinic in 4 weeks or earlier if problem  [name] very well could have a component of inflammation affecting her airway possibly triggered off by allergen exposure and reflux for which I will have her utilize a plan of allergen avoidance measures and inhaled steroids and a proton pump inhibitor.  However, she also has a very strong presentation for vocal cord dysfunction and she may require a more extensive evaluation and therapy for this issue at [facility] if this continues in the face of the plan mentioned above.  To work through her possible food allergy we will check IgE antibodies directed against specific food products.  I will regroup with her in 4 weeks or earlier if there is a problem.  Jessica PriestEric J. Kaniel Kiang, MD Allergy / Immunology [facility]

## 2017-05-11 ENCOUNTER — Encounter: Payer: Self-pay | Admitting: Allergy and Immunology

## 2017-05-11 NOTE — Addendum Note (Signed)
Addended by: Dub MikesHICKS, Rozalyn Osland N on: 05/11/2017 04:07 PM   Modules accepted: Orders

## 2017-05-12 LAB — ALLERGEN PROFILE, SHELLFISH
Clam IgE: <0.1 kU/L
F023-IgE Crab: <0.1 kU/L
F080-IgE Lobster: <0.1 kU/L
F290-IgE Oyster: <0.1 kU/L
Scallop IgE: <0.1 kU/L

## 2017-05-12 LAB — ALLERGEN PROFILE, FOOD-FISH
Allergen Mackerel IgE: <0.1 kU/L
Allergen Salmon IgE: <0.1 kU/L
Allergen Trout IgE: <0.1 kU/L

## 2017-05-17 ENCOUNTER — Other Ambulatory Visit: Payer: Self-pay | Admitting: Obstetrics and Gynecology

## 2017-05-17 DIAGNOSIS — Z1231 Encounter for screening mammogram for malignant neoplasm of breast: Secondary | ICD-10-CM

## 2017-06-14 ENCOUNTER — Ambulatory Visit
Admission: RE | Admit: 2017-06-14 | Discharge: 2017-06-14 | Disposition: A | Discharge status: Home / Self Care | Payer: PRIVATE HEALTH INSURANCE | Source: Ambulatory Visit | Attending: Obstetrics and Gynecology | Admitting: Obstetrics and Gynecology

## 2017-06-14 DIAGNOSIS — Z1231 Encounter for screening mammogram for malignant neoplasm of breast: Secondary | ICD-10-CM

## 2018-03-28 ENCOUNTER — Encounter: Payer: Self-pay | Admitting: Primary Care

## 2018-03-28 ENCOUNTER — Ambulatory Visit (INDEPENDENT_AMBULATORY_CARE_PROVIDER_SITE_OTHER)
Admission: RE | Admit: 2018-03-28 | Discharge: 2018-03-28 | Disposition: A | Discharge status: Home / Self Care | Payer: 59 | Source: Ambulatory Visit | Attending: Primary Care | Admitting: Primary Care

## 2018-03-28 ENCOUNTER — Ambulatory Visit: Payer: 59 | Admitting: Primary Care

## 2018-03-28 ENCOUNTER — Telehealth: Payer: Self-pay | Admitting: Internal Medicine

## 2018-03-28 VITALS — BP 126/82 | HR 87 | Ht 65.0 in | Wt 183.8 lb

## 2018-03-28 DIAGNOSIS — R05 Cough: Secondary | ICD-10-CM | POA: Diagnosis not present

## 2018-03-28 DIAGNOSIS — F411 Generalized anxiety disorder: Secondary | ICD-10-CM

## 2018-03-28 DIAGNOSIS — R059 Cough, unspecified: Secondary | ICD-10-CM

## 2018-03-28 DIAGNOSIS — J45901 Unspecified asthma with (acute) exacerbation: Secondary | ICD-10-CM | POA: Insufficient documentation

## 2018-03-28 MED ORDER — MOMETASONE FUROATE 220 MCG/INH IN AEPB: 2.0000 | INHALATION_SPRAY | Freq: Every day | RESPIRATORY_TRACT | 12 refills | Status: DC

## 2018-03-28 MED ORDER — METHYLPREDNISOLONE ACETATE 80 MG/ML IJ SUSP
120.0000 mg | Freq: Once | INTRAMUSCULAR | Status: AC
  Administered 2018-03-28: 120 mg via INTRAMUSCULAR

## 2018-03-28 MED ORDER — METHYLPREDNISOLONE ACETATE 80 MG/ML IJ SUSP: 80.0000 mg | Freq: Once | INTRAMUSCULAR | Status: DC

## 2018-03-28 MED ORDER — DEXTROMETHORPHAN-GUAIFENESIN 10-100 MG/5ML PO LIQD
5.0000 mL | ORAL | 1 refills | Status: DC | PRN
Start: 2018-03-28 — End: 2018-03-31

## 2018-03-28 MED ORDER — ALPRAZOLAM 0.5 MG PO TABS: 0.5000 mg | ORAL_TABLET | Freq: Two times a day (BID) | ORAL | 0 refills | Status: AC | PRN

## 2018-03-28 MED ORDER — ALPRAZOLAM 0.5 MG PO TABS: 0.5000 mg | ORAL_TABLET | Freq: Two times a day (BID) | ORAL | 0 refills | Status: DC | PRN

## 2018-03-28 MED ORDER — LEVALBUTEROL HCL 0.63 MG/3ML IN NEBU
0.6300 mg | INHALATION_SOLUTION | Freq: Once | RESPIRATORY_TRACT | Status: AC
  Administered 2018-03-28: 0.63 mg via RESPIRATORY_TRACT

## 2018-03-28 NOTE — Patient Instructions (Addendum)
Office treatment: Depo-medrol 120mg  IM x1 today Xopenex nebulizer x1 today   RX: Prednisone taper as prescribed  Restart Asmanex 1 puff into lungs daily Omeprazole twice daily Zyrtec daily  Xanax 1 tab twice daily prn anxiety x 7 days (no refills) Tussin cough syrup 5ml q4 hours prn cough (no refills)  Testing:  CXR today  Follow-up on Friday with Dr. Maple Hudson or NP

## 2018-03-28 NOTE — Progress Notes (Signed)
@Patient  ID: [name], female    DOB: 14-Jan-1961, 57 y.o.   MRN: [id number]  Chief Complaint  Patient presents with  . Acute Visit    cough x2 weeks, SOB, chesttightness and wheezing    Referring provider: [name], MD  HPI: 57 year old female, never smoked. PMH asthma with bronchitis. Patient of Dr. Maple Hudson, last seen on 08/08/2013.   03/28/2018 Patient presents today with hacking/dry cough x2 weeks. Associated sob with coughing fits and voice hoarseness. Not currently taking Asmanex. Using rescue inhaler and nebulizer, helps for a little while. Went to UC last week given zpack, prednisone taper and hydromet cough syrup. Afebrile.    Allergies  Allergen Reactions  . Doxycycline Anaphylaxis  . Metoclopramide Hcl Shortness Of Breath and Swelling  . Prochlorperazine Edisylate Anaphylaxis  . Compazine  [Prochlorperazine Edisylate]   . Erythromycin Base   . Macrobid  [Nitrofurantoin Macrocrystal]     Immunization History  Administered Date(s) Administered  . Influenza Whole 03/23/2013    Past Medical History:  Diagnosis Date  . Anxiety   . Asthma   . Chronic kidney disease   . Complication of anesthesia    was slow to wake up with one of her surgeries  . Cough   . Headache    hx. migraines  . Hyperventilation   . Insomnia   . Pleurisy   . UTI (urinary tract infection)     Tobacco History: Social History   Tobacco Use  Smoking Status Never Smoker  Smokeless Tobacco Never Used   Counseling given: Not Answered   Outpatient Medications Prior to Visit  Medication Sig Dispense Refill  . PROVENTIL HFA 108 (90 Base) MCG/ACT inhaler INHALE 2 PUFFS BY MOUTH INTO THE LUNGS EVERY 4 HOURS AS NEEDED FOR WHEEZING OR SHORTNESS OF BREATH 6.7 g 0  . Calcium Carbonate-Vit D-Min (CALCIUM 1200 PO) Take by mouth.    . Cholecalciferol (VITAMIN D PO) Take by mouth.    . dexlansoprazole (DEXILANT) 60 MG capsule Take 1 capsule (60 mg total) daily by mouth. (Patient not  taking: Reported on 03/28/2018) 30 capsule 5  . Ferrous Sulfate (IRON) 325 (65 Fe) MG TABS Take by mouth.    . mometasone (ASMANEX 14 METERED DOSES) 220 MCG/INH inhaler Inhale 1 puff daily into the lungs. 1 Inhaler 5  . Probiotic Product (PROBIOTIC DAILY PO) Take by mouth.     No facility-administered medications prior to visit.     Review of Systems  Review of Systems  Constitutional: Negative for fever.  HENT: Negative.   Respiratory: Positive for cough, chest tightness, shortness of breath and wheezing. Negative for apnea, choking and stridor.   Cardiovascular: Negative.    Physical Exam  BP 126/82 (BP Location: Right Arm, Cuff Size: Normal)   Pulse 87   Ht 5\' 5"  (1.651 m)   Wt 183 lb 12.8 oz (83.4 kg)   SpO2 95%   BMI 30.59 kg/m  Physical Exam  Constitutional: She appears well-developed and well-nourished. No distress.  Healthy adult female   HENT:  Head: Normocephalic and atraumatic.  Eyes: Pupils are equal, round, and reactive to light. EOM are normal.  Neck: Normal range of motion. Neck supple.  Cardiovascular: Normal rate and regular rhythm.  Pulmonary/Chest: Effort normal. No respiratory distress.  CTA, diminished. +upper airway wheeze +hacking cough No respiratory distress.   Musculoskeletal: Normal range of motion.  Skin: Skin is warm and dry.  Psychiatric: Judgment and thought content normal.  Anxious  Lab Results:  CBC    Component Value Date/Time   WBC 5.5 04/19/2016 0900   RBC 3.72 (L) 04/19/2016 0900   HGB 10.5 (L) 04/19/2016 0900   HCT 31.1 (L) 04/19/2016 0900   PLT 337 04/19/2016 0900   MCV 83.6 04/19/2016 0900   MCH 28.2 04/19/2016 0900   MCHC 33.8 04/19/2016 0900   RDW 14.1 04/19/2016 0900   LYMPHSABS 1.5 04/19/2016 0900   MONOABS 0.3 04/19/2016 0900   EOSABS 0.1 04/19/2016 0900   BASOSABS 0.0 04/19/2016 0900    BMET    Component Value Date/Time   NA 142 04/20/2016 0438   K 3.3 (L) 04/20/2016 0438   CL 104 04/20/2016 0438   CO2  30 04/20/2016 0438   GLUCOSE 99 04/20/2016 0438   BUN 11 04/20/2016 0438   CREATININE 1.00 04/20/2016 0438   CALCIUM 9.2 04/20/2016 0438   GFRNONAA >60 04/20/2016 0438   GFRAA >60 04/20/2016 0438    BNP No results found for: BNP  ProBNP    Component Value Date/Time   PROBNP 53.0 05/14/2008 1650    Imaging: Dg Chest 2 View  Result Date: 03/28/2018 CLINICAL DATA:  Cough, shortness of breath. EXAM: CHEST - 2 VIEW COMPARISON:  Radiographs of April 18, 2016. FINDINGS: The heart size and mediastinal contours are within normal limits. Both lungs are clear. No pneumothorax or pleural effusion is noted. The visualized skeletal structures are unremarkable. IMPRESSION: No active cardiopulmonary disease. Electronically Signed   By: Lupita Raider, M.D.   On: 03/28/2018 15:40     Assessment & Plan:   Asthma exacerbation - Acute excerebration of asthma symptoms. Complains of wheeze and np cough. VSS, O2 sat 95% RA. LS mostly clear, slightly diminished but moving air  - Received depo-medrol 120mg  IM x1 today and Xopenex neb treatment - CXR showed no acute cardiopulmonary process - Plan restart Asmanex and extended prednisone taper - Rx Tussin cough syrup 5ml every 4 hours  - Add daily antihistamine  - FU in 3 days    Anxiety state - Anxiety situational and contributing to cough/upper airway wheeze  - She has been given xanax in the past  - PMP reviewed with no unexpected prescriptions since 2017  - RX for xanax 0.5mg  twice daily prn anxiety x 7 days only (no refill)     Glenford Bayley, NP 03/28/2018

## 2018-03-28 NOTE — Telephone Encounter (Signed)
Called and spoke with patient. She is very hoarse (losing voice) with cough, keeps getting worse. Wants to be seen before appointment with Dr. Maple Hudson on Thursday 10/3 .   Scheduled today with [name] NP. Nothing further needed

## 2018-03-28 NOTE — Assessment & Plan Note (Addendum)
-   Acute excerebration of asthma symptoms. Complains of wheeze and np cough. VSS, O2 sat 95% RA. LS mostly clear, slightly diminished but moving air  - Received depo-medrol 120mg  IM x1 today and Xopenex neb treatment - CXR showed no acute cardiopulmonary process - Plan restart Asmanex and extended prednisone taper - Rx Tussin cough syrup 5ml every 4 hours  - Add daily antihistamine  - FU in 3 days

## 2018-03-28 NOTE — Assessment & Plan Note (Signed)
-   Anxiety situational and contributing to cough/upper airway wheeze  - She has been given xanax in the past  - PMP reviewed with no unexpected prescriptions since 2017  - RX for xanax 0.5mg  twice daily prn anxiety x 7 days only (no refill)

## 2018-03-30 ENCOUNTER — Ambulatory Visit: Payer: Self-pay | Admitting: Internal Medicine

## 2018-03-31 ENCOUNTER — Ambulatory Visit: Payer: 59 | Admitting: Primary Care

## 2018-03-31 ENCOUNTER — Encounter: Payer: Self-pay | Admitting: Primary Care

## 2018-03-31 VITALS — BP 124/72 | HR 51 | Ht 65.0 in | Wt 182.8 lb

## 2018-03-31 DIAGNOSIS — R059 Cough, unspecified: Secondary | ICD-10-CM

## 2018-03-31 DIAGNOSIS — R05 Cough: Secondary | ICD-10-CM | POA: Diagnosis not present

## 2018-03-31 LAB — POCT EXHALED NITRIC OXIDE: FENO LEVEL (PPB): 15

## 2018-03-31 MED ORDER — HYDROCOD POLST-CPM POLST ER 10-8 MG/5ML PO SUER: 5.0000 mL | Freq: Two times a day (BID) | ORAL | 0 refills | Status: DC | PRN

## 2018-03-31 NOTE — Progress Notes (Signed)
@Patient  ID: [name], female    DOB: Apr 13, 1961, 57 y.o.   MRN: [id number]  Chief Complaint  Patient presents with  . Follow-up    cough, non productive    Referring provider: [name], MD  HPI: 57 year old female, never smoked. PMH asthma with bronchitis. Patient of Dr. Maple Hudson, last seen on 08/08/2013.   03/28/2018 Patient presents today with hacking/dry cough x2 weeks. Associated sob with coughing fits and voice hoarseness. Not currently taking Asmanex. Using rescue inhaler and nebulizer, helps for a little while. Went to UC last week given zpack, prednisone taper and hydromet cough syrup. Afebrile.   03/31/2018 Patient presents today for 3 days follow-up. Seen on 10/1 for cough, treated for asthma exacerbation with depo-medrol 120mg  IM and xopenex neb. CXR was normal. Restarted Asmanex and extended prednisone taper. Add daily antihistamine. Given RX for Tussin cough syrup and treating anxiety which is likely contributing to symptoms with short supply for xanax.   Feels no difference from 3 days ago. Continues to have dry cough and voice hoarseness. Associated runny nose. Started Asmanex on Tuesday, using 2 puffs into lung daily. Taking Zyrtec by mouth once daily and continues dexilant. Used rescue inhaler twice a day with no improvement. Tussionex has helped in the past. FENO 15    Allergies  Allergen Reactions  . Doxycycline Anaphylaxis  . Metoclopramide Hcl Shortness Of Breath and Swelling  . Prochlorperazine Edisylate Anaphylaxis  . Compazine  [Prochlorperazine Edisylate]   . Erythromycin Base   . Macrobid  [Nitrofurantoin Macrocrystal]     Immunization History  Administered Date(s) Administered  . Influenza Whole 03/23/2013    Past Medical History:  Diagnosis Date  . Anxiety   . Asthma   . Chronic kidney disease   . Complication of anesthesia    was slow to wake up with one of her surgeries  . Cough   . Headache    hx. migraines  . Hyperventilation     . Insomnia   . Pleurisy   . UTI (urinary tract infection)     Tobacco History: Social History   Tobacco Use  Smoking Status Never Smoker  Smokeless Tobacco Never Used   Counseling given: Not Answered   Outpatient Medications Prior to Visit  Medication Sig Dispense Refill  . ALPRAZolam (XANAX) 0.5 MG tablet Take 1 tablet (0.5 mg total) by mouth 2 (two) times daily as needed for up to 10 days for anxiety. 14 tablet 0  . Calcium Carbonate-Vit D-Min (CALCIUM 1200 PO) Take by mouth.    . Cholecalciferol (VITAMIN D PO) Take by mouth.    . dexlansoprazole (DEXILANT) 60 MG capsule Take 1 capsule (60 mg total) daily by mouth. 30 capsule 5  . Ferrous Sulfate (IRON) 325 (65 Fe) MG TABS Take by mouth.    . mometasone (ASMANEX, 60 METERED DOSES,) 220 MCG/INH inhaler Inhale 2 puffs into the lungs daily. 1 Inhaler 12  . Probiotic Product (PROBIOTIC DAILY PO) Take by mouth.    Marland Kitchen PROVENTIL HFA 108 (90 Base) MCG/ACT inhaler INHALE 2 PUFFS BY MOUTH INTO THE LUNGS EVERY 4 HOURS AS NEEDED FOR WHEEZING OR SHORTNESS OF BREATH 6.7 g 0  . dextromethorphan-guaiFENesin (TUSSIN DM) 10-100 MG/5ML liquid Take 5 mLs by mouth every 4 (four) hours as needed for up to 10 days for cough. 237 mL 1  . mometasone (ASMANEX 14 METERED DOSES) 220 MCG/INH inhaler Inhale 1 puff daily into the lungs. 1 Inhaler 5   No facility-administered medications  prior to visit.     Review of Systems  Review of Systems  Constitutional: Negative.   HENT: Positive for postnasal drip.   Respiratory: Positive for cough and shortness of breath. Negative for apnea, choking, chest tightness, wheezing and stridor.   Cardiovascular: Negative.     Physical Exam  BP 124/72 (BP Location: Left Arm, Cuff Size: Normal)   Pulse (!) 51   Ht 5\' 5"  (1.651 m)   Wt 182 lb 12.8 oz (82.9 kg)   SpO2 95%   BMI 30.42 kg/m  Physical Exam  Constitutional: She is oriented to person, place, and time. She appears well-developed and well-nourished. No  distress.  HENT:  Head: Normocephalic and atraumatic.  Voice hoarseness   Eyes: Pupils are equal, round, and reactive to light. EOM are normal.  Neck: Normal range of motion. Neck supple.  Cardiovascular: Normal rate and regular rhythm.  Pulmonary/Chest: Breath sounds normal. No respiratory distress. She has no wheezes.  CTA. NO wheeze. + Upper airway cough.   Neurological: She is alert and oriented to person, place, and time.  Skin: Skin is warm. No rash noted.  Psychiatric: She has a normal mood and affect. Her behavior is normal. Judgment and thought content normal.     Lab Results:  CBC    Component Value Date/Time   WBC 5.5 04/19/2016 0900   RBC 3.72 (L) 04/19/2016 0900   HGB 10.5 (L) 04/19/2016 0900   HCT 31.1 (L) 04/19/2016 0900   PLT 337 04/19/2016 0900   MCV 83.6 04/19/2016 0900   MCH 28.2 04/19/2016 0900   MCHC 33.8 04/19/2016 0900   RDW 14.1 04/19/2016 0900   LYMPHSABS 1.5 04/19/2016 0900   MONOABS 0.3 04/19/2016 0900   EOSABS 0.1 04/19/2016 0900   BASOSABS 0.0 04/19/2016 0900    BMET    Component Value Date/Time   NA 142 04/20/2016 0438   K 3.3 (L) 04/20/2016 0438   CL 104 04/20/2016 0438   CO2 30 04/20/2016 0438   GLUCOSE 99 04/20/2016 0438   BUN 11 04/20/2016 0438   CREATININE 1.00 04/20/2016 0438   CALCIUM 9.2 04/20/2016 0438   GFRNONAA >60 04/20/2016 0438   GFRAA >60 04/20/2016 0438    BNP No results found for: BNP  ProBNP    Component Value Date/Time   PROBNP 53.0 05/14/2008 1650    Imaging: Dg Chest 2 View  Result Date: 03/28/2018 CLINICAL DATA:  Cough, shortness of breath. EXAM: CHEST - 2 VIEW COMPARISON:  Radiographs of April 18, 2016. FINDINGS: The heart size and mediastinal contours are within normal limits. Both lungs are clear. No pneumothorax or pleural effusion is noted. The visualized skeletal structures are unremarkable. IMPRESSION: No active cardiopulmonary disease. Electronically Signed   By: Lupita Raider, M.D.   On:  03/28/2018 15:40     Assessment & Plan:   Asthma exacerbation - Stable; Breathing has improved - FENO 15 - Continue prednisone until complete and Asmanex - Can return to work on Monday 10/7  COUGH - Upper airway cough with voice hoarseness - Continue dexilant and zyrtec - Add flonase  - RX for Tussionex (no refills, not to be combined with other sedating medication) - Encourage voice rest - FU if not improvement in 7-10 days      Glenford Bayley, NP 03/31/2018

## 2018-03-31 NOTE — Patient Instructions (Signed)
Continue: Dexilant once daily (this is for acid reflux) Zyrtec once daily (this is for allergy symptoms)  Add: Flonase nasal spray (this is for post nasal drip)  RX: Tussionex 5ml every 12 hours (do not combine with other sedating medication)  Follow up with Dr. Maple Hudson or Waynetta Sandy, np if no improvement in symptoms

## 2018-03-31 NOTE — Assessment & Plan Note (Addendum)
-   Upper airway cough with voice hoarseness - Continue dexilant and zyrtec - Add flonase  - RX for Tussionex (no refills, not to be combined with other sedating medication) - Encourage voice rest - FU if not improvement in 7-10 days

## 2018-03-31 NOTE — Assessment & Plan Note (Addendum)
-   Stable; Breathing has improved - FENO 15 - Continue prednisone until complete and Asmanex - Can return to work on Monday 10/7

## 2018-04-03 ENCOUNTER — Encounter: Payer: Self-pay | Admitting: *Deleted

## 2018-04-03 ENCOUNTER — Telehealth: Payer: Self-pay | Admitting: Primary Care

## 2018-04-03 NOTE — Telephone Encounter (Signed)
Spoke with pt. States that she is needing her work note extended. At her last OV with Twanna Hy gave her a work note to keep her out of work until 04/03/18. Pt is now wanting that extended until 04/06/18. She is still having issues with SOB, coughing, headache and hoarseness. Pt is also requesting for a refill on alprazolam.  Beth - please advise. Thanks.

## 2018-04-03 NOTE — Telephone Encounter (Signed)
She can be out of work for an additional 3 days. No refill of xanax. Needs follow up with Dr. Maple Hudson if symptoms persist please.

## 2018-04-03 NOTE — Telephone Encounter (Signed)
Spoke with pt. She is aware of Beth's response. Work note has been drafted, signed and placed up front for pick up. Nothing further was needed.

## 2018-04-03 NOTE — Telephone Encounter (Signed)
Also states was given xanax RX for a few days and would like more of this also.

## 2018-04-05 ENCOUNTER — Telehealth: Payer: Self-pay | Admitting: Primary Care

## 2018-04-05 NOTE — Telephone Encounter (Signed)
Please route request for work-note extension to [name], who has seen the patient for this episode. Thanks

## 2018-04-05 NOTE — Telephone Encounter (Signed)
Called and spoke to pt.  Pt received work note on 04/03/18, to return to work on 04/06/18.  Pt is requesting that note be extended to 04/10/18, as she is still not feeling well.  Pt reports non prod cough, sob with exertion & chest discomfort with coughing. Denied additional symptoms. I have offered apt, but has requested CY recommendations prior to scheduling.   CY please advise. Thanks.  Current Outpatient Medications on File Prior to Visit  Medication Sig Dispense Refill  . ALPRAZolam (XANAX) 0.5 MG tablet Take 1 tablet (0.5 mg total) by mouth 2 (two) times daily as needed for up to 10 days for anxiety. 14 tablet 0  . Calcium Carbonate-Vit D-Min (CALCIUM 1200 PO) Take by mouth.    . chlorpheniramine-HYDROcodone (TUSSIONEX PENNKINETIC ER) 10-8 MG/5ML SUER Take 5 mLs by mouth every 12 (twelve) hours as needed for cough. 140 mL 0  . Cholecalciferol (VITAMIN D PO) Take by mouth.    . dexlansoprazole (DEXILANT) 60 MG capsule Take 1 capsule (60 mg total) daily by mouth. 30 capsule 5  . Ferrous Sulfate (IRON) 325 (65 Fe) MG TABS Take by mouth.    . mometasone (ASMANEX, 60 METERED DOSES,) 220 MCG/INH inhaler Inhale 2 puffs into the lungs daily. 1 Inhaler 12  . Probiotic Product (PROBIOTIC DAILY PO) Take by mouth.    Marland Kitchen PROVENTIL HFA 108 (90 Base) MCG/ACT inhaler INHALE 2 PUFFS BY MOUTH INTO THE LUNGS EVERY 4 HOURS AS NEEDED FOR WHEEZING OR SHORTNESS OF BREATH 6.7 g 0   No current facility-administered medications on file prior to visit.     Allergies  Allergen Reactions  . Doxycycline Anaphylaxis  . Metoclopramide Hcl Shortness Of Breath and Swelling  . Prochlorperazine Edisylate Anaphylaxis  . Compazine  [Prochlorperazine Edisylate]   . Erythromycin Base   . Macrobid  [Nitrofurantoin Macrocrystal]

## 2018-04-05 NOTE — Telephone Encounter (Signed)
I have typed up the letter with the extension until 04/10/18.  Called and spoke with pt letting her know we have extended the leave but stated to her if she felt like she needed more time past 10/14 that she needed to come in for an OV with Dr. Maple Hudson.  Pt expressed understanding. Per pt's request, letter has been placed in brown accordion folder.  Nothing further needed.

## 2018-04-05 NOTE — Telephone Encounter (Signed)
That is fine. But I will not continue to provide notes for her to be out of work after this. She needs to come in to see Dr. Maple Hudson if continues to not feel well.

## 2018-04-10 ENCOUNTER — Telehealth: Payer: Self-pay | Admitting: Primary Care

## 2018-04-10 ENCOUNTER — Other Ambulatory Visit (INDEPENDENT_AMBULATORY_CARE_PROVIDER_SITE_OTHER): Payer: 59

## 2018-04-10 ENCOUNTER — Ambulatory Visit: Payer: 59 | Admitting: Primary Care

## 2018-04-10 ENCOUNTER — Encounter: Payer: Self-pay | Admitting: Primary Care

## 2018-04-10 VITALS — BP 128/82 | HR 101 | Temp 97.5°F | Ht 65.0 in | Wt 184.0 lb

## 2018-04-10 DIAGNOSIS — J45909 Unspecified asthma, uncomplicated: Secondary | ICD-10-CM

## 2018-04-10 DIAGNOSIS — J0100 Acute maxillary sinusitis, unspecified: Secondary | ICD-10-CM

## 2018-04-10 DIAGNOSIS — J339 Nasal polyp, unspecified: Secondary | ICD-10-CM | POA: Diagnosis not present

## 2018-04-10 DIAGNOSIS — R0602 Shortness of breath: Secondary | ICD-10-CM | POA: Diagnosis not present

## 2018-04-10 DIAGNOSIS — R059 Cough, unspecified: Secondary | ICD-10-CM

## 2018-04-10 DIAGNOSIS — R05 Cough: Secondary | ICD-10-CM

## 2018-04-10 DIAGNOSIS — J329 Chronic sinusitis, unspecified: Secondary | ICD-10-CM | POA: Insufficient documentation

## 2018-04-10 LAB — BASIC METABOLIC PANEL
BUN: 15 mg/dL (ref 6–23)
CALCIUM: 10.2 mg/dL (ref 8.4–10.5)
CO2: 31 mEq/L (ref 19–32)
CREATININE: 1 mg/dL (ref 0.40–1.20)
Chloride: 103 mEq/L (ref 96–112)
GFR: 73.62 mL/min (ref >60.00)
Glucose, Bld: 88 mg/dL (ref 70–99)
Potassium: 3.8 mEq/L (ref 3.5–5.1)
Sodium: 140 mEq/L (ref 135–145)

## 2018-04-10 LAB — CBC WITH DIFFERENTIAL/PLATELET
BASOS ABS: 0 10*3/uL (ref 0.0–0.1)
Basophils Relative: 0.3 % (ref 0.0–3.0)
EOS ABS: 0 10*3/uL (ref 0.0–0.7)
EOS PCT: 0.3 % (ref 0.0–5.0)
HCT: 37.2 % (ref 36.0–46.0)
Hemoglobin: 12.5 g/dL (ref 12.0–15.0)
LYMPHS ABS: 1.9 10*3/uL (ref 0.7–4.0)
Lymphocytes Relative: 30 % (ref 12.0–46.0)
MCHC: 33.7 g/dL (ref 30.0–36.0)
MCV: 86.6 fl (ref 78.0–100.0)
MONO ABS: 0.3 10*3/uL (ref 0.1–1.0)
Monocytes Relative: 4 % (ref 3.0–12.0)
NEUTROS PCT: 65.4 % (ref 43.0–77.0)
Neutro Abs: 4.2 10*3/uL (ref 1.4–7.7)
Platelets: 188 10*3/uL (ref 150.0–400.0)
RBC: 4.3 Mil/uL (ref 3.87–5.11)
RDW: 13.6 % (ref 11.5–15.5)
WBC: 6.4 10*3/uL (ref 4.0–10.5)

## 2018-04-10 MED ORDER — FAMOTIDINE 40 MG PO TABS: 40.0000 mg | ORAL_TABLET | Freq: Every day | ORAL | 5 refills | Status: DC

## 2018-04-10 MED ORDER — HYDROCOD POLST-CPM POLST ER 10-8 MG/5ML PO SUER: 5.0000 mL | Freq: Two times a day (BID) | ORAL | 0 refills | Status: DC

## 2018-04-10 MED ORDER — AMOXICILLIN-POT CLAVULANATE 875-125 MG PO TABS: 1.0000 | ORAL_TABLET | Freq: Two times a day (BID) | ORAL | 0 refills | Status: DC

## 2018-04-10 MED ORDER — PREDNISONE 10 MG PO TABS: ORAL_TABLET | ORAL | 0 refills | Status: DC

## 2018-04-10 MED ORDER — GUAIFENESIN 100 MG/5ML PO SYRP: 200.0000 mg | ORAL_SOLUTION | Freq: Three times a day (TID) | ORAL | 0 refills | Status: DC | PRN

## 2018-04-10 MED ORDER — METHYLPREDNISOLONE ACETATE 80 MG/ML IJ SUSP
80.0000 mg | Freq: Once | INTRAMUSCULAR | Status: AC
  Administered 2018-04-10: 80 mg via INTRAMUSCULAR

## 2018-04-10 MED ORDER — LEVALBUTEROL HCL 0.63 MG/3ML IN NEBU
0.6300 mg | INHALATION_SOLUTION | RESPIRATORY_TRACT | Status: AC
  Administered 2018-04-10: 0.63 mg via RESPIRATORY_TRACT

## 2018-04-10 MED ORDER — BUDESONIDE-FORMOTEROL FUMARATE 80-4.5 MCG/ACT IN AERO: 2.0000 | INHALATION_SPRAY | Freq: Two times a day (BID) | RESPIRATORY_TRACT | 2 refills | Status: AC

## 2018-04-10 NOTE — Addendum Note (Signed)
Addended by: Earvin Hansen on: 04/10/2018 02:06 PM   Modules accepted: Orders

## 2018-04-10 NOTE — Telephone Encounter (Signed)
Called and spoke with pt letting her know that she needs to speak with PCP about further anxiety management.  Pt expressed understanding. Nothing further needed.

## 2018-04-10 NOTE — Assessment & Plan Note (Addendum)
-   Complains of new nasal congestion and HA - Reasonable to treat with abx, Rx Augmentin x 7 days - Recommend zyrtec in am, ocean nasal spray q1-2hrs and flonase at bedtime  - Refer to ENT for ? nasal polyps

## 2018-04-10 NOTE — Assessment & Plan Note (Addendum)
-   Asthma exacerbation secondary to tracheobronchitis and acute sinusitis. 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

## 2018-04-10 NOTE — Patient Instructions (Addendum)
Office treatment: Xopenex neb x1 Depo-Medrol 80 mg IM x1   Orders: CT chest with contrast re: hemoptysis  Labs today   RX: Augmentin twice daily x 7 days  Prednisone taper as prescribed Stop Asmanex Start Symbicort 80-  take 2 puffs twice   Albuterol rescue inhaler 2 puffs every 4 hours as needed for wheezing  GERD: Continue Dexilant for GERD Add Pepcid 40mg  at bedtime  Follow GERD diet   Referral: ENT re: ? nasal polyps  Recommendations: Zyrtec in morning Ocean nasal spray every 1-2 hours during the say Flonase nasal spray at BEDTIME    Note out of work until Friday 10/18  Follow-up:  Dr. Maple Hudson next available     Food Choices for Gastroesophageal Reflux Disease, Adult When you have gastroesophageal reflux disease (GERD), the foods you eat and your eating habits are very important. Choosing the right foods can help ease your discomfort. What guidelines do I need to follow?  Choose fruits, vegetables, whole grains, and low-fat dairy products.  Choose low-fat meat, fish, and poultry.  Limit fats such as oils, salad dressings, butter, nuts, and avocado.  Keep a food diary. This helps you identify foods that cause symptoms.  Avoid foods that cause symptoms. These may be different for everyone.  Eat small meals often instead of 3 large meals a day.  Eat your meals slowly, in a place where you are relaxed.  Limit fried foods.  Cook foods using methods other than frying.  Avoid drinking alcohol.  Avoid drinking large amounts of liquids with your meals.  Avoid bending over or lying down until 2-3 hours after eating. What foods are not recommended? These are some foods and drinks that may make your symptoms worse: Vegetables Tomatoes. Tomato juice. Tomato and spaghetti sauce. Chili peppers. Onion and garlic. Horseradish. Fruits Oranges, grapefruit, and lemon (fruit and juice). Meats High-fat meats, fish, and poultry. This includes hot dogs, ribs, ham,  sausage, salami, and bacon. Dairy Whole milk and chocolate milk. Sour cream. Cream. Butter. Ice cream. Cream cheese. Drinks Coffee and tea. Bubbly (carbonated) drinks or energy drinks. Condiments Hot sauce. Barbecue sauce. Sweets/Desserts Chocolate and cocoa. Donuts. Peppermint and spearmint. Fats and Oils High-fat foods. This includes [place] fries and potato chips. Other Vinegar. Strong spices. This includes black pepper, white pepper, red pepper, cayenne, curry powder, cloves, ginger, and chili powder. The items listed above may not be a complete list of foods and drinks to avoid. Contact your dietitian for more information. This information is not intended to replace advice given to you by your health care provider. Make sure you discuss any questions you have with your health care provider. Document Released: 12/14/2011 Document Revised: 11/20/2015 Document Reviewed: 04/18/2013 Elsevier Interactive Patient Education  2017 [employer].

## 2018-04-10 NOTE — Addendum Note (Signed)
Addended by: Earvin Hansen on: 04/10/2018 01:46 PM   Modules accepted: Orders

## 2018-04-10 NOTE — Telephone Encounter (Signed)
Called and spoke with Patient.  She is requesting a refill on xanax.  She stated that she forgot to ask at her OV today, with  Beth.  She stated that she had 14 days BID, she has finished.  She stated that she is very jittery, and feels she needs something.  Will route to Ames Dura, NP

## 2018-04-10 NOTE — Assessment & Plan Note (Addendum)
-   Upper airway cough syndrome - Recommend GERD treatment with Dexilant and add Pepcid at bedtime - Tussionex twice daily with safe precautions (not to be combined with other sedating medication or take before driving)

## 2018-04-10 NOTE — Progress Notes (Signed)
@Patient  ID: [name], female    DOB: 11/30/1960, 57 y.o.   MRN: [id number]  Chief Complaint  Patient presents with  . Acute Visit    Increased SOB, cough (not productive), wheezing and chills.     Referring provider: [name], MD  HPI: 56 year old female, never smoked. PMH asthma with bronchitis. Patient of Dr. Maple Hudson, last seen on 08/08/2013.   03/28/2018 Patient presents today with hacking/dry cough x2 weeks. Associated sob with coughing fits and voice hoarseness. Not currently taking Asmanex. Using rescue inhaler and nebulizer, helps for a little while. Went to UC last week given zpack, prednisone taper and hydromet cough syrup. Afebrile.   03/31/2018 Patient presents today for 3 days follow-up. Seen on 10/1 for cough, treated for asthma exacerbation with depo-medrol 120mg  IM and xopenex neb. CXR was normal. Restarted Asmanex and extended prednisone taper. Add daily antihistamine. Given RX for Tussin cough syrup and treating anxiety which is likely contributing to symptoms with short supply for xanax.   Feels no difference from 3 days ago. Continues to have dry cough and voice hoarseness. Associated runny nose. Started Asmanex on Tuesday, using 2 puffs into lung daily. Taking Zyrtec by mouth once daily and continues dexilant. Used rescue inhaler twice a day with no improvement. Tussionex has helped in the past. FENO 15   04/10/2018 Patient presents for acute visit for breathing issues. Symptoms have been persistent for the last 4-6 weeks. Cough is mostly dry, occasional hemoptysis. Associated sob. States that she went to work this morning and developed sweats and HA. Previous prednisone taper did help a little. Continues dexilant daily. Almost out of Asmanex trial. No recent travel. Afebrile today in office.     Allergies  Allergen Reactions  . Doxycycline Anaphylaxis  . Metoclopramide Hcl Shortness Of Breath and Swelling  . Prochlorperazine Edisylate Anaphylaxis  .  Compazine  [Prochlorperazine Edisylate]   . Erythromycin Base   . Macrobid  [Nitrofurantoin Macrocrystal]     Immunization History  Administered Date(s) Administered  . Influenza Whole 03/23/2013    Past Medical History:  Diagnosis Date  . Anxiety   . Asthma   . Chronic kidney disease   . Complication of anesthesia    was slow to wake up with one of her surgeries  . Cough   . Headache    hx. migraines  . Hyperventilation   . Insomnia   . Pleurisy   . UTI (urinary tract infection)     Tobacco History: Social History   Tobacco Use  Smoking Status Never Smoker  Smokeless Tobacco Never Used   Counseling given: Not Answered   Outpatient Medications Prior to Visit  Medication Sig Dispense Refill  . Calcium Carbonate-Vit D-Min (CALCIUM 1200 PO) Take by mouth.    . Cholecalciferol (VITAMIN D PO) Take by mouth.    . dexlansoprazole (DEXILANT) 60 MG capsule Take 1 capsule (60 mg total) daily by mouth. 30 capsule 5  . Ferrous Sulfate (IRON) 325 (65 Fe) MG TABS Take by mouth.    . mometasone (ASMANEX, 60 METERED DOSES,) 220 MCG/INH inhaler Inhale 2 puffs into the lungs daily. 1 Inhaler 12  . Probiotic Product (PROBIOTIC DAILY PO) Take by mouth.    Marland Kitchen PROVENTIL HFA 108 (90 Base) MCG/ACT inhaler INHALE 2 PUFFS BY MOUTH INTO THE LUNGS EVERY 4 HOURS AS NEEDED FOR WHEEZING OR SHORTNESS OF BREATH 6.7 g 0  . chlorpheniramine-HYDROcodone (TUSSIONEX PENNKINETIC ER) 10-8 MG/5ML SUER Take 5 mLs by mouth every  12 (twelve) hours as needed for cough. 140 mL 0   No facility-administered medications prior to visit.     Review of Systems  Review of Systems  Constitutional: Positive for chills.  HENT: Positive for congestion, postnasal drip and sinus pressure.   Respiratory: Positive for cough, shortness of breath and wheezing.   Cardiovascular: Negative.   Neurological: Positive for headaches.    Physical Exam  BP 128/82 (BP Location: Left Arm, Patient Position: Sitting, Cuff Size:  Normal)   Pulse (!) 101   Temp (!) 97.5 F (36.4 C) (Oral)   Ht 5\' 5"  (1.651 m)   Wt 184 lb (83.5 kg)   SpO2 100%   BMI 30.62 kg/m  Physical Exam  Constitutional: She is oriented to person, place, and time. She appears well-developed and well-nourished. No distress.  HENT:  Head: Normocephalic and atraumatic.  Nose: Mucosal edema and rhinorrhea present. Right sinus exhibits maxillary sinus tenderness. Left sinus exhibits maxillary sinus tenderness.  Mouth/Throat: Uvula is midline and oropharynx is clear and moist.  Eyes: Pupils are equal, round, and reactive to light. EOM and lids are normal. Right conjunctiva is injected. Left conjunctiva is injected.  Neck: Normal range of motion. Neck supple.  Cardiovascular: Regular rhythm.  Pulmonary/Chest: Effort normal. She has wheezes.  Upper airway cough and wheeze. Mild accessory use. O2 sat 100% RA.   Musculoskeletal: Normal range of motion.  Neurological: She is alert and oriented to person, place, and time.  Skin: Skin is warm and dry.  Psychiatric: She has a normal mood and affect. Her behavior is normal. Judgment and thought content normal.  Anxious      Lab Results:  CBC    Component Value Date/Time   WBC 5.5 04/19/2016 0900   RBC 3.72 (L) 04/19/2016 0900   HGB 10.5 (L) 04/19/2016 0900   HCT 31.1 (L) 04/19/2016 0900   PLT 337 04/19/2016 0900   MCV 83.6 04/19/2016 0900   MCH 28.2 04/19/2016 0900   MCHC 33.8 04/19/2016 0900   RDW 14.1 04/19/2016 0900   LYMPHSABS 1.5 04/19/2016 0900   MONOABS 0.3 04/19/2016 0900   EOSABS 0.1 04/19/2016 0900   BASOSABS 0.0 04/19/2016 0900    BMET    Component Value Date/Time   NA 142 04/20/2016 0438   K 3.3 (L) 04/20/2016 0438   CL 104 04/20/2016 0438   CO2 30 04/20/2016 0438   GLUCOSE 99 04/20/2016 0438   BUN 11 04/20/2016 0438   CREATININE 1.00 04/20/2016 0438   CALCIUM 9.2 04/20/2016 0438   GFRNONAA >60 04/20/2016 0438   GFRAA >60 04/20/2016 0438    BNP No results found  for: BNP  ProBNP    Component Value Date/Time   PROBNP 53.0 05/14/2008 1650    Imaging: Dg Chest 2 View  Result Date: 03/28/2018 CLINICAL DATA:  Cough, shortness of breath. EXAM: CHEST - 2 VIEW COMPARISON:  Radiographs of April 18, 2016. FINDINGS: The heart size and mediastinal contours are within normal limits. Both lungs are clear. No pneumothorax or pleural effusion is noted. The visualized skeletal structures are unremarkable. IMPRESSION: No active cardiopulmonary disease. Electronically Signed   By: Lupita Raider, M.D.   On: 03/28/2018 15:40     Assessment & Plan:   Asthma with bronchitis - Asthma exacerbation secondary to tracheobronchitis and acute sinusitis. 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 - Received depo-medrol 80mg  IM x1 today and xopenex neb treatment - Change Asmanex to Symbicort 80 - Prednisone taper   - CT chest w/contrast RE:  hemoptysis  - Labs today with IgE  COUGH - Upper airway cough syndrome - Recommend GERD treatment with Dexilant and add Pepcid at bedtime - Tussionex twice daily with safe precautions (not to be combined with other sedating medication or take before driving)    Sinusitis - Complains of new nasal congestion and HA - Reasonable to treat with abx, Rx Augmentin x 7 days - Recommend zyrtec in am, ocean nasal spray q1-2hrs and flonase at bedtime  - Refer to ENT for ? nasal polyps      Glenford Bayley, NP 04/10/2018

## 2018-04-10 NOTE — Telephone Encounter (Signed)
That was a one time medication. She needs to speak with PCP about further anxiety management.

## 2018-04-11 ENCOUNTER — Other Ambulatory Visit: Payer: Self-pay

## 2018-04-11 ENCOUNTER — Telehealth: Payer: Self-pay | Admitting: Primary Care

## 2018-04-11 DIAGNOSIS — R042 Hemoptysis: Secondary | ICD-10-CM

## 2018-04-11 LAB — IGE: IgE (Immunoglobulin E), Serum: 18 kU/L (ref <=114)

## 2018-04-11 NOTE — Telephone Encounter (Signed)
Order change

## 2018-04-13 ENCOUNTER — Telehealth: Payer: Self-pay | Admitting: Internal Medicine

## 2018-04-13 ENCOUNTER — Inpatient Hospital Stay: Admission: RE | Admit: 2018-04-13 | Payer: Self-pay | Source: Ambulatory Visit

## 2018-04-13 NOTE — Telephone Encounter (Signed)
Called and spoke with [name] to see if I could schedule an OV for her to see CY after the ct scan.  I have scheduled [name] an appt with CY Friday, 10/25 for a follow up after CT.  [name] asked me if she can have another note to be written out of work for next week. [name] had written a note for her to be out until 10/14 but [name] is now wanting to be written out until her appt with you on 10/25.  Per Waynetta Sandy, she was only writing the note until 10/14 and then [name] had to see CY for an appt.  Dr. Maple Hudson, please advise on this for [name] if you are okay writing a note for her. Thanks!

## 2018-04-13 NOTE — Telephone Encounter (Signed)
Katie- please see what you can do about scheduling her as requested, thanks

## 2018-04-13 NOTE — Telephone Encounter (Signed)
Spoke with the [name]  She is scheduled for ct for eval of hemoptysis on 04/17/18  She states needing appt to see Dr Maple Hudson soon after this  She was most recently seen by [name]  Please advise, thanks!

## 2018-04-13 NOTE — Telephone Encounter (Signed)
RNA spot okay to use. Thanks.

## 2018-04-13 NOTE — Telephone Encounter (Signed)
Ok to extend her work note? 

## 2018-04-13 NOTE — Telephone Encounter (Signed)
Routing back to [employer] for recommendations in regards to this.

## 2018-04-13 NOTE — Telephone Encounter (Signed)
Called and spoke with pt letting her know that CY has said we could write a letter to extend her out of work stay. Pt expressed understanding.  I asked pt how long she wanted to be out of work so I could place it in the note. After verifying the time for her, the letter has been typed up and will place for it to be signed by CY.  Once it is signed by CY, we will place it up front for pt to come pick up. Nothing further needed.

## 2018-04-14 ENCOUNTER — Other Ambulatory Visit: Payer: Self-pay

## 2018-04-14 ENCOUNTER — Ambulatory Visit: Payer: Self-pay | Admitting: Internal Medicine

## 2018-04-17 ENCOUNTER — Inpatient Hospital Stay: Admission: RE | Admit: 2018-04-17 | Payer: Self-pay | Source: Ambulatory Visit

## 2018-04-18 ENCOUNTER — Telehealth: Payer: Self-pay | Admitting: Primary Care

## 2018-04-20 NOTE — Telephone Encounter (Signed)
Working on it. Thank you

## 2018-04-21 ENCOUNTER — Other Ambulatory Visit: Payer: Self-pay

## 2018-04-21 ENCOUNTER — Ambulatory Visit: Payer: 59 | Admitting: Internal Medicine

## 2018-04-21 DIAGNOSIS — R042 Hemoptysis: Secondary | ICD-10-CM

## 2018-04-25 ENCOUNTER — Ambulatory Visit (INDEPENDENT_AMBULATORY_CARE_PROVIDER_SITE_OTHER)
Admission: RE | Admit: 2018-04-25 | Discharge: 2018-04-25 | Disposition: A | Discharge status: Home / Self Care | Payer: 59 | Source: Ambulatory Visit | Attending: Primary Care | Admitting: Primary Care

## 2018-04-25 ENCOUNTER — Inpatient Hospital Stay: Admission: RE | Admit: 2018-04-25 | Payer: Self-pay | Source: Ambulatory Visit

## 2018-04-25 DIAGNOSIS — R042 Hemoptysis: Secondary | ICD-10-CM | POA: Diagnosis not present

## 2018-04-25 NOTE — Telephone Encounter (Signed)
Forms sent back to [employer] on 04/24/18

## 2018-04-27 ENCOUNTER — Encounter: Payer: Self-pay | Admitting: Nurse Practitioner

## 2018-04-27 ENCOUNTER — Ambulatory Visit: Payer: 59 | Admitting: Nurse Practitioner

## 2018-04-27 VITALS — BP 126/84 | HR 78 | Ht 65.0 in | Wt 179.4 lb

## 2018-04-27 DIAGNOSIS — J4541 Moderate persistent asthma with (acute) exacerbation: Secondary | ICD-10-CM | POA: Diagnosis not present

## 2018-04-27 MED ORDER — PREDNISONE 10 MG PO TABS: ORAL_TABLET | ORAL | 0 refills | Status: DC

## 2018-04-27 MED ORDER — BENZONATATE 100 MG PO CAPS: 100.0000 mg | ORAL_CAPSULE | Freq: Three times a day (TID) | ORAL | 1 refills | Status: DC

## 2018-04-27 MED ORDER — METHYLPREDNISOLONE ACETATE 80 MG/ML IJ SUSP
80.0000 mg | Freq: Once | INTRAMUSCULAR | Status: AC
  Administered 2018-04-27: 80 mg via INTRAMUSCULAR

## 2018-04-27 MED ORDER — LEVOFLOXACIN 500 MG PO TABS: 500.0000 mg | ORAL_TABLET | Freq: Every day | ORAL | 0 refills | Status: DC

## 2018-04-27 NOTE — Patient Instructions (Addendum)
Asthma exacerbation slow to resolve Please see urgent care for migraines if you do not have a PCP Please set up appointment with PCP as soon as possible Will order prednisone taper DepoMedrol given in office today Discussed CT scan was clear Warm tea with honey may help with comfort Will order Levaquin Follow up with Dr. Maple Hudson at first available appointment or sooner if needed

## 2018-04-27 NOTE — Assessment & Plan Note (Signed)
Patient Instructions  Asthma exacerbation slow to resolve Please see urgent care for migraines if you do not have a PCP Please set up appointment with PCP as soon as possible Will order prednisone taper DepoMedrol given in office today Discussed CT scan was clear Warm tea with honey may help with comfort Will order Levaquin Follow up with Dr. Maple Hudson at first available appointment or sooner if needed

## 2018-04-27 NOTE — Progress Notes (Signed)
@Patient  ID: [name], female    DOB: 05/10/1961, 57 y.o.   MRN: [id number]  Chief Complaint  Patient presents with  . Follow-up    CT Scan    Referring provider: [name], MD   HPI 57 year old female never smoker with asthma followed by Dr. Maple Hudson.   Tests: Chest CT 04/27/18 - No abnormality seen in the chest.  OV 04/27/18 - Follow up cough Patient presents for a follow up on cough. She was seen by [facility] on 04/10/18. She was ordered Augmentin, prednisone, and started on Symbicort. She had a CT scan that was negative. She states that she is still having dry cough. states that she is no better. States that xanax helps her cough. She was given a short prescription for xanax by Beth at last visit. She was told no refills and to follow up with her PCP. She states that she doesn't have a PCP and sees neurology for migraines, but hasn't been to them in a long time. She is requesting tizanidine for migraines and xanax for her cough and anxiety. She states that she does get short of breath with her cough. She denies any fever, chest pain, or edema.      Allergies  Allergen Reactions  . Doxycycline Anaphylaxis  . Metoclopramide Hcl Shortness Of Breath and Swelling  . Prochlorperazine Edisylate Anaphylaxis  . Compazine  [Prochlorperazine Edisylate]   . Erythromycin Base   . Macrobid  [Nitrofurantoin Macrocrystal]     Immunization History  Administered Date(s) Administered  . Influenza Whole 03/23/2013    Past Medical History:  Diagnosis Date  . Anxiety   . Asthma   . Chronic kidney disease   . Complication of anesthesia    was slow to wake up with one of her surgeries  . Cough   . Headache    hx. migraines  . Hyperventilation   . Insomnia   . Pleurisy   . UTI (urinary tract infection)     Tobacco History: Social History   Tobacco Use  Smoking Status Never Smoker  Smokeless Tobacco Never Used   Counseling given: Yes   Outpatient Encounter Medications  as of 04/27/2018  Medication Sig  . budesonide-formoterol (SYMBICORT) 80-4.5 MCG/ACT inhaler Inhale 2 puffs into the lungs 2 (two) times daily.  . Calcium Carbonate-Vit D-Min (CALCIUM 1200 PO) Take by mouth.  . chlorpheniramine-HYDROcodone (TUSSIONEX PENNKINETIC ER) 10-8 MG/5ML SUER Take 5 mLs by mouth 2 (two) times daily.  . Cholecalciferol (VITAMIN D PO) Take by mouth.  . dexlansoprazole (DEXILANT) 60 MG capsule Take 1 capsule (60 mg total) daily by mouth.  . famotidine (PEPCID) 40 MG tablet Take 1 tablet (40 mg total) by mouth at bedtime.  . Ferrous Sulfate (IRON) 325 (65 Fe) MG TABS Take by mouth.  . Probiotic Product (PROBIOTIC DAILY PO) Take by mouth.  Marland Kitchen PROVENTIL HFA 108 (90 Base) MCG/ACT inhaler INHALE 2 PUFFS BY MOUTH INTO THE LUNGS EVERY 4 HOURS AS NEEDED FOR WHEEZING OR SHORTNESS OF BREATH  . benzonatate (TESSALON) 100 MG capsule Take 1 capsule (100 mg total) by mouth 3 (three) times daily.  Marland Kitchen levofloxacin (LEVAQUIN) 500 MG tablet Take 1 tablet (500 mg total) by mouth daily.  . predniSONE (DELTASONE) 10 MG tablet Take 3 tabs for 2 days, then 2 tabs for 2 days, then 1 tab for 2 days, then stop  . [DISCONTINUED] amoxicillin-clavulanate (AUGMENTIN) 875-125 MG tablet Take 1 tablet by mouth 2 (two) times daily. (Patient not taking:  Reported on 04/27/2018)  . [DISCONTINUED] mometasone (ASMANEX, 60 METERED DOSES,) 220 MCG/INH inhaler Inhale 2 puffs into the lungs daily. (Patient not taking: Reported on 04/27/2018)  . [DISCONTINUED] predniSONE (DELTASONE) 10 MG tablet Take 4 tabs po daily x 3 days; then 3 tabs daily x3 days; then 2 tabs daily x3 days; then 1 tab daily x 3 days; then stop (Patient not taking: Reported on 04/27/2018)  . [DISCONTINUED] predniSONE (DELTASONE) 10 MG tablet Take 3 tabs for 2 days, then 2 tabs for 2 days, then 1 tab for 2 days, then stop  . [EXPIRED] methylPREDNISolone acetate (DEPO-MEDROL) injection 80 mg    No facility-administered encounter medications on file as  of 04/27/2018.      Review of Systems  Review of Systems  Constitutional: Negative.  Negative for chills and fever.  HENT: Negative.   Respiratory: Positive for cough and shortness of breath. Negative for wheezing.   Cardiovascular: Negative.  Negative for chest pain, palpitations and leg swelling.  Gastrointestinal: Negative.   Allergic/Immunologic: Negative.   Neurological: Negative.   Psychiatric/Behavioral: Negative.        Physical Exam  BP 126/84 (BP Location: Left Arm, Patient Position: Sitting, Cuff Size: Normal)   Pulse 78   Ht 5\' 5"  (1.651 m)   Wt 179 lb 6.4 oz (81.4 kg)   SpO2 96%   BMI 29.85 kg/m   Wt Readings from Last 5 Encounters:  04/27/18 179 lb 6.4 oz (81.4 kg)  04/10/18 184 lb (83.5 kg)  03/31/18 182 lb 12.8 oz (82.9 kg)  03/28/18 183 lb 12.8 oz (83.4 kg)  05/10/17 184 lb (83.5 kg)     Physical Exam  Constitutional: She is oriented to person, place, and time. She appears well-developed and well-nourished. No distress.  Cardiovascular: Normal rate and regular rhythm.  Pulmonary/Chest: Effort normal and breath sounds normal. No respiratory distress. She has no wheezes. She has no rales.  Musculoskeletal: She exhibits no edema.  Neurological: She is alert and oriented to person, place, and time.  Psychiatric: She has a normal mood and affect.  Nursing note and vitals reviewed.    Lab Results:  CBC    Component Value Date/Time   WBC 6.4 04/10/2018 1246   RBC 4.30 04/10/2018 1246   HGB 12.5 04/10/2018 1246   HCT 37.2 04/10/2018 1246   PLT 188.0 04/10/2018 1246   MCV 86.6 04/10/2018 1246   MCH 28.2 04/19/2016 0900   MCHC 33.7 04/10/2018 1246   RDW 13.6 04/10/2018 1246   LYMPHSABS 1.9 04/10/2018 1246   MONOABS 0.3 04/10/2018 1246   EOSABS 0.0 04/10/2018 1246   BASOSABS 0.0 04/10/2018 1246    BMET    Component Value Date/Time   NA 140 04/10/2018 1246   K 3.8 04/10/2018 1246   CL 103 04/10/2018 1246   CO2 31 04/10/2018 1246    GLUCOSE 88 04/10/2018 1246   BUN 15 04/10/2018 1246   CREATININE 1.00 04/10/2018 1246   CALCIUM 10.2 04/10/2018 1246   GFRNONAA >60 04/20/2016 0438   GFRAA >60 04/20/2016 0438    BNP No results found for: BNP  ProBNP    Component Value Date/Time   PROBNP 53.0 05/14/2008 1650    Imaging: Dg Chest 2 View  Result Date: 03/28/2018 CLINICAL DATA:  Cough, shortness of breath. EXAM: CHEST - 2 VIEW COMPARISON:  Radiographs of April 18, 2016. FINDINGS: The heart size and mediastinal contours are within normal limits. Both lungs are clear. No pneumothorax or pleural effusion is noted.  The visualized skeletal structures are unremarkable. IMPRESSION: No active cardiopulmonary disease. Electronically Signed   By: Lupita Raider, M.D.   On: 03/28/2018 15:40   Ct Chest Wo Contrast  Result Date: 04/25/2018 CLINICAL DATA:  Hemoptysis. EXAM: CT CHEST WITHOUT CONTRAST TECHNIQUE: Multidetector CT imaging of the chest was performed following the standard protocol without IV contrast. COMPARISON:  Radiographs of March 28, 2018. CT scan of August 08, 2013. FINDINGS: Cardiovascular: No significant vascular findings. Normal heart size. No pericardial effusion. Mediastinum/Nodes: No enlarged mediastinal or axillary lymph nodes. Thyroid gland, trachea, and esophagus demonstrate no significant findings. Lungs/Pleura: Lungs are clear. No pleural effusion or pneumothorax. Upper Abdomen: No acute abnormality. Musculoskeletal: No chest wall mass or suspicious bone lesions identified. IMPRESSION: No abnormality seen in the chest. Electronically Signed   By: Lupita Raider, M.D.   On: 04/25/2018 13:57     Assessment & Plan:   Asthma exacerbation Patient Instructions  Asthma exacerbation slow to resolve Please see urgent care for migraines if you do not have a PCP Please set up appointment with PCP as soon as possible Will order prednisone taper DepoMedrol given in office today Discussed CT scan was  clear Warm tea with honey may help with comfort Will order Levaquin Follow up with Dr. Maple Hudson at first available appointment or sooner if needed        Ivonne Andrew, NP 04/27/2018

## 2018-04-28 ENCOUNTER — Inpatient Hospital Stay: Admission: RE | Admit: 2018-04-28 | Payer: Self-pay | Source: Ambulatory Visit

## 2018-07-17 ENCOUNTER — Ambulatory Visit: Payer: Self-pay | Admitting: Internal Medicine

## 2018-11-03 ENCOUNTER — Other Ambulatory Visit: Payer: Self-pay | Admitting: Obstetrics and Gynecology

## 2018-11-03 DIAGNOSIS — Z9289 Personal history of other medical treatment: Secondary | ICD-10-CM

## 2018-12-27 ENCOUNTER — Ambulatory Visit
Admission: RE | Admit: 2018-12-27 | Discharge: 2018-12-27 | Disposition: A | Discharge status: Home / Self Care | Payer: Commercial Managed Care - PPO | Source: Ambulatory Visit | Attending: Obstetrics and Gynecology | Admitting: Obstetrics and Gynecology

## 2018-12-27 ENCOUNTER — Other Ambulatory Visit: Payer: Self-pay

## 2018-12-27 DIAGNOSIS — Z9289 Personal history of other medical treatment: Secondary | ICD-10-CM

## 2019-03-08 ENCOUNTER — Ambulatory Visit: Payer: Self-pay

## 2019-03-08 ENCOUNTER — Ambulatory Visit (INDEPENDENT_AMBULATORY_CARE_PROVIDER_SITE_OTHER): Payer: Commercial Managed Care - PPO | Admitting: Surgery

## 2019-03-08 ENCOUNTER — Encounter: Payer: Self-pay | Admitting: Surgery

## 2019-03-08 VITALS — Ht 65.0 in | Wt 170.0 lb

## 2019-03-08 DIAGNOSIS — M76821 Posterior tibial tendinitis, right leg: Secondary | ICD-10-CM | POA: Diagnosis not present

## 2019-03-08 DIAGNOSIS — M79671 Pain in right foot: Secondary | ICD-10-CM | POA: Diagnosis not present

## 2019-03-08 DIAGNOSIS — M25561 Pain in right knee: Secondary | ICD-10-CM | POA: Diagnosis not present

## 2019-03-08 DIAGNOSIS — M7751 Other enthesopathy of right foot: Secondary | ICD-10-CM | POA: Diagnosis not present

## 2019-03-08 DIAGNOSIS — M2241 Chondromalacia patellae, right knee: Secondary | ICD-10-CM

## 2019-03-08 NOTE — Progress Notes (Signed)
Office Visit Note   Patient: [name]           Date of Birth: 04-Sep-1960           MRN: [id number] Visit Date: 03/08/2019              Requested by: Newton Pigg, [place],  [place] [id number] PCP: Newton Pigg, MD   Assessment & Plan: Visit Diagnoses:  1. Right knee pain, unspecified chronicity   2. Right foot pain   3. Retrocalcaneal bursitis, right   4. Posterior tibial tendinitis, right leg   5. Chondromalacia of patellofemoral joint, right     Plan: Since patient's posterior heel pain is the bigger problem I asked Dr. Junius Roads to do an ultrasound-guided right pre-Achilles bursa Marcaine/Depo-Medrol injection.  Also asked if he would look at her posterior tibial tendon to see if there are any issues there.  She has some soreness over the Achilles tendon but I do not feel a palpable defect.  Patient will pay close attention to how she feels after this injection.  I will have her go  into her boot for a week and then return to see me.  When she returns I may consider doing a right knee intra-articular Marcaine/Depo-Medrol injection.  She will hold off on her lower body exercises.  Follow-Up Instructions: Return in about 1 week (around 03/15/2019) for with Rikki Smestad recheck foot and knee.   Orders:  Orders Placed This Encounter  Procedures   XR Foot Complete Right   XR KNEE 3 VIEW RIGHT   No orders of the defined types were placed in this encounter.     Procedures: No procedures performed   Clinical Data: No additional findings.   Subjective: Chief Complaint  Patient presents with   Right Knee - Pain   Right Foot - Pain    HPI 58-year-old black female comes in today with complaints of right knee pain and right foot pain.  Patient states that right foot pain started first.  Localizes her pain to the posterior heel and also medial foot and ankle.  No specific injury that she can recall.  She has been working out on a stair  stepper the last couple of months or so but has also recently increased her time of use.  Pain when she is ambulating and plantar flexes her foot.  Has had some swelling.  Right knee pain started shortly after the foot.  Pain when she is going up and down stairs.  Describes having more pain in the patellofemoral area.  No mechanical symptoms or feeling of instability of the knee.  No specific knee injury.  She was seen at the urgent care February 02, 2019 and patient was told that she had a possible stress fracture of her foot.  She has a cam boot at home and was advised to wear that.  No lumbar spine hip or radicular component.  Review of Systems No current cardiac pulmonary GI GU issues  Objective: Vital Signs: Ht 5\' 5"  (1.651 m)    Wt 170 lb (77.1 kg)    BMI 28.29 kg/m   Physical Exam HENT:     Head: Normocephalic and atraumatic.  Eyes:     Extraocular Movements: Extraocular movements intact.     Pupils: Pupils are equal, round, and reactive to light.  Pulmonary:     Effort: Pulmonary effort is normal. No respiratory distress.  Musculoskeletal:     Comments: Gait  is antalgic.  Negative logroll bilateral hips.  Negative straight leg raise.  Bilateral knees good range of motion.  Positive bilateral patellofemoral crepitus.  Right knee positive patellar grind.  Moderately tender medial plica.  Cruciate and collateral ligaments are stable.  Some swelling without large effusion.  Bilateral calves nontender.  Right ankle good range of motion.  Ankle ligaments are stable.   moderate to marked tenderness over the pre-Achilles bursa.  Some tenderness over the Achilles tendon watershed..  Nontender over the calcaneal insertion.  No palpable tendon defect.  Mild moderately tender over the posterior tibial tendon.  Neurological:     General: No focal deficit present.     Mental Status: She is alert and oriented to person, place, and time.  Psychiatric:        Mood and Affect: Mood normal.     Ortho  Exam  Specialty Comments:  No specialty comments available.  Imaging: No results found.   PMFS History: Patient Active Problem List   Diagnosis Date Noted   Sinusitis 04/10/2018   Asthma exacerbation 03/28/2018   SOB (shortness of breath)    Pyelonephritis 04/15/2016   Normochromic normocytic anemia 04/15/2016   Elevated LFTs 04/15/2016   HNP (herniated nucleus pulposus), lumbar 07/04/2015    Class: Acute   Anxiety state 08/08/2013   INSOMNIA 05/20/2008   Asthma with bronchitis 05/15/2007   PLEURISY 05/15/2007   HYPERVENTILATION 05/15/2007   COUGH 05/15/2007   Past Medical History:  Diagnosis Date   Anxiety    Asthma    Chronic kidney disease    Complication of anesthesia    was slow to wake up with one of her surgeries   Cough    Headache    hx. migraines   Hyperventilation    Insomnia    Pleurisy    UTI (urinary tract infection)     Family History  Problem Relation Age of Onset   Prostate cancer Father    Allergic rhinitis Mother    Breast cancer Mother [id number]   Angioedema Neg Hx    Eczema Neg Hx    Urticaria Neg Hx    Asthma Neg Hx     Past Surgical History:  Procedure Laterality Date   LUMBAR LAMINECTOMY N/A 07/04/2015   Procedure: Left L5-S1 MICRODISCECTOMY;  Surgeon: Kerrin ChampagneJames E Nitka, MD;  Location: [facility] OR;  Service: Orthopedics;  Laterality: N/A;   LUMBAR SPINE SURGERY     UTERINE FIBROID SURGERY     VESICOVAGINAL FISTULA CLOSURE W/ TAH     Social History   Occupational History   Occupation: marketing/sales  Tobacco Use   Smoking status: Never Smoker   Smokeless tobacco: Never Used  Substance and Sexual Activity   Alcohol use: No   Drug use: No   Sexual activity: Not on file

## 2019-03-08 NOTE — Progress Notes (Signed)
Subjective: She is here for ultrasound-guided injection right Achilles area.  Objective: She is tender on the medial side of the Achilles at the insertion of the calcaneus, retro-calcaneal bursa area.  Also a little bit tender near the tibialis posterior tendon.  Procedure: Ultrasound-guided retrocalcaneal bursa injection: After sterile prep with Betadine, injected 1 cc 1% lidocaine without epinephrine and 20 mg methylprednisolone into the retrocalcaneal bursa without complication.  She will follow-up as directed.

## 2019-03-15 ENCOUNTER — Other Ambulatory Visit: Payer: Self-pay

## 2019-03-15 ENCOUNTER — Encounter: Payer: Self-pay | Admitting: Surgery

## 2019-03-15 ENCOUNTER — Ambulatory Visit (INDEPENDENT_AMBULATORY_CARE_PROVIDER_SITE_OTHER): Payer: Commercial Managed Care - PPO | Admitting: Surgery

## 2019-03-15 DIAGNOSIS — M2241 Chondromalacia patellae, right knee: Secondary | ICD-10-CM

## 2019-03-15 DIAGNOSIS — M76821 Posterior tibial tendinitis, right leg: Secondary | ICD-10-CM

## 2019-03-15 DIAGNOSIS — M79671 Pain in right foot: Secondary | ICD-10-CM

## 2019-03-15 DIAGNOSIS — M7751 Other enthesopathy of right foot: Secondary | ICD-10-CM

## 2019-03-15 NOTE — Progress Notes (Signed)
   Office Visit Note   Patient: [name]           Date of Birth: June 29, 1960           MRN: [id number] Visit Date: 03/15/2019              Requested by: Newton Pigg, [address],  [place] [address] PCP: Newton Pigg, MD   Assessment & Plan: Visit Diagnoses:  1. Right foot pain   2. Posterior tibial tendinitis, right leg   3. Retrocalcaneal bursitis, right   4. Chondromalacia of patellofemoral joint, right     Plan: Today blood work was drawn to check a CBC and arthritis panel.  Patient will follow-up in 2 weeks to discuss results.  Follow-Up Instructions: Return in about 2 weeks (around 03/29/2019).   Orders:  Orders Placed This Encounter  Procedures   CBC   Antinuclear Antib (ANA)   Uric acid   Rheumatoid Factor   Sed Rate (ESR)   No orders of the defined types were placed in this encounter.     Procedures: No procedures performed   Clinical Data: No additional findings.   Subjective: Chief Complaint  Patient presents with   Right Knee - Follow-up   Right Achilles Tendon - Follow-up    HPI  Review of Systems   Objective: Vital Signs: There were no vitals taken for this visit.  Physical Exam  Ortho Exam  Specialty Comments:  No specialty comments available.  Imaging: No results found.   PMFS History: Patient Active Problem List   Diagnosis Date Noted   Sinusitis 04/10/2018   Asthma exacerbation 03/28/2018   SOB (shortness of breath)    Pyelonephritis 04/15/2016   Normochromic normocytic anemia 04/15/2016   Elevated LFTs 04/15/2016   HNP (herniated nucleus pulposus), lumbar 07/04/2015    Class: Acute   Anxiety state 08/08/2013   INSOMNIA 05/20/2008   Asthma with bronchitis 05/15/2007   PLEURISY 05/15/2007   HYPERVENTILATION 05/15/2007   COUGH 05/15/2007   Past Medical History:  Diagnosis Date   Anxiety    Asthma    Chronic kidney disease    Complication of anesthesia    was slow to wake  up with one of her surgeries   Cough    Headache    hx. migraines   Hyperventilation    Insomnia    Pleurisy    UTI (urinary tract infection)     Family History  Problem Relation Age of Onset   Prostate cancer Father    Allergic rhinitis Mother    Breast cancer Mother 79   Angioedema Neg Hx    Eczema Neg Hx    Urticaria Neg Hx    Asthma Neg Hx     Past Surgical History:  Procedure Laterality Date   LUMBAR LAMINECTOMY N/A 07/04/2015   Procedure: Left L5-S1 MICRODISCECTOMY;  Surgeon: Jessy Oto, MD;  Location: [facility];  Service: Orthopedics;  Laterality: N/A;   LUMBAR SPINE SURGERY     UTERINE FIBROID SURGERY     VESICOVAGINAL FISTULA CLOSURE W/ TAH     Social History   Occupational History   Occupation: marketing/sales  Tobacco Use   Smoking status: Never Smoker   Smokeless tobacco: Never Used  Substance and Sexual Activity   Alcohol use: No   Drug use: No   Sexual activity: Not on file

## 2019-03-17 LAB — ANTI-NUCLEAR AB-TITER (ANA TITER): ANA Titer 1: 1:40 {titer} — ABNORMAL HIGH

## 2019-03-17 LAB — CBC
HCT: 36 % (ref 35.0–45.0)
Hemoglobin: 11.9 g/dL (ref 11.7–15.5)
MCH: 28.4 pg (ref 27.0–33.0)
MCHC: 33.1 g/dL (ref 32.0–36.0)
MCV: 85.9 fL (ref 80.0–100.0)
MPV: 11.5 fL (ref 7.5–12.5)
Platelets: 232 10*3/uL (ref 140–400)
RBC: 4.19 10*6/uL (ref 3.80–5.10)
RDW: 12.9 % (ref 11.0–15.0)
WBC: 4.3 10*3/uL (ref 3.8–10.8)

## 2019-03-17 LAB — URIC ACID: Uric Acid, Serum: 6.4 mg/dL (ref 2.5–7.0)

## 2019-03-17 LAB — SEDIMENTATION RATE: Sed Rate: 11 mm/h (ref 0–30)

## 2019-03-17 LAB — ANA: Anti Nuclear Antibody (ANA): POSITIVE — AB

## 2019-03-17 LAB — RHEUMATOID FACTOR: Rheumatoid fact SerPl-aCnc: <14 IU/mL (ref <14)

## 2019-03-29 ENCOUNTER — Ambulatory Visit (INDEPENDENT_AMBULATORY_CARE_PROVIDER_SITE_OTHER): Payer: Commercial Managed Care - PPO | Admitting: Surgery

## 2019-03-29 ENCOUNTER — Encounter: Payer: Self-pay | Admitting: Surgery

## 2019-03-29 VITALS — Ht 65.0 in | Wt 170.0 lb

## 2019-03-29 DIAGNOSIS — M2550 Pain in unspecified joint: Secondary | ICD-10-CM

## 2019-03-29 DIAGNOSIS — M76821 Posterior tibial tendinitis, right leg: Secondary | ICD-10-CM

## 2019-03-29 DIAGNOSIS — R768 Other specified abnormal immunological findings in serum: Secondary | ICD-10-CM

## 2019-03-29 DIAGNOSIS — M722 Plantar fascial fibromatosis: Secondary | ICD-10-CM

## 2019-03-29 NOTE — Progress Notes (Signed)
Office Visit Note   Patient: [name]           Date of Birth: October 01, 1960           MRN: [id number] Visit Date: 03/29/2019              Requested by: [name], MD [address] [place],  [place] [address] PCP: [name], MD   Assessment & Plan: Visit Diagnoses:  1. Polyarthralgia   2. Plantar fasciitis of right foot   3. Positive ANA (antinuclear antibody)   4. Tibial tendonitis, posterior, right     Plan: Since patient would like to proceed with trying right plantar fascia injection I agreed to do this.  Patient sent right plantar heel was prepped with Betadine and injection was performed.  We will have patient go to formal PT for right plantar fasciitis and right posterior tibial tendinitis.  They can evaluate and treat and do modalities as needed.  Patient does have a positive ANA and will make referral to Dr. Zenovia Jordan rheumatologist.  Follow-Up Instructions: Return in about 4 weeks (around 04/26/2019).   Orders:  Orders Placed This Encounter  Procedures  . Foot Inj  . Ambulatory referral to Rheumatology  . Ambulatory referral to Physical Therapy   No orders of the defined types were placed in this encounter.     Procedures: Foot Inj  Date/Time: 03/29/2019 2:14 PM Performed by: Naida Sleight, PA-C Authorized by: Naida Sleight, PA-C   Consent Given by:  Patient Indications:  Fasciitis and pain Condition: Plantar Fasciitis   Location: right plantar fascia muscle   Prep: patient was prepped and draped in usual sterile fashion   Needle Size:  25 G Medications:  13.33 mg methylPREDNISolone acetate 40 MG/ML; 0.33 mL bupivacaine 0.25 %     Clinical Data: No additional findings.   Subjective: Chief Complaint  Patient presents with  . Right Leg - Follow-up    HPI  Review of Systems   Objective: Vital Signs: Ht 5\' 5"  (1.651 m)   Wt 170 lb (77.1 kg)   BMI 28.29 kg/m   Physical Exam  Ortho Exam  Specialty  Comments:  No specialty comments available.  Imaging: No results found.   PMFS History: Patient Active Problem List   Diagnosis Date Noted  . Sinusitis 04/10/2018  . Asthma exacerbation 03/28/2018  . SOB (shortness of breath)   . Pyelonephritis 04/15/2016  . Normochromic normocytic anemia 04/15/2016  . Elevated LFTs 04/15/2016  . HNP (herniated nucleus pulposus), lumbar 07/04/2015    Class: Acute  . Anxiety state 08/08/2013  . INSOMNIA 05/20/2008  . Asthma with bronchitis 05/15/2007  . PLEURISY 05/15/2007  . HYPERVENTILATION 05/15/2007  . COUGH 05/15/2007   Past Medical History:  Diagnosis Date  . Anxiety   . Asthma   . Chronic kidney disease   . Complication of anesthesia    was slow to wake up with one of her surgeries  . Cough   . Headache    hx. migraines  . Hyperventilation   . Insomnia   . Pleurisy   . UTI (urinary tract infection)     Family History  Problem Relation Age of Onset  . Prostate cancer Father   . Allergic rhinitis Mother   . Breast cancer Mother 62  . Angioedema Neg Hx   . Eczema Neg Hx   . Urticaria Neg Hx   . Asthma Neg Hx     Past Surgical  History:  Procedure Laterality Date  . LUMBAR LAMINECTOMY N/A 07/04/2015   Procedure: Left L5-S1 MICRODISCECTOMY;  Surgeon: Jessy Oto, MD;  Location: [facility];  Service: Orthopedics;  Laterality: N/A;  . LUMBAR SPINE SURGERY    . UTERINE FIBROID SURGERY    . VESICOVAGINAL FISTULA CLOSURE W/ TAH     Social History   Occupational History  . Occupation: marketing/sales  Tobacco Use  . Smoking status: Never Smoker  . Smokeless tobacco: Never Used  Substance and Sexual Activity  . Alcohol use: No  . Drug use: No  . Sexual activity: Not on file

## 2019-04-05 ENCOUNTER — Telehealth: Payer: Self-pay | Admitting: Surgery

## 2019-04-05 NOTE — Telephone Encounter (Signed)
03/15/19 labs faxed to [facility] [phone] per their request

## 2019-04-12 ENCOUNTER — Encounter: Payer: Self-pay | Admitting: Physical Therapy

## 2019-04-12 ENCOUNTER — Ambulatory Visit: Payer: Commercial Managed Care - PPO | Attending: Surgery | Admitting: Physical Therapy

## 2019-04-12 ENCOUNTER — Other Ambulatory Visit: Payer: Self-pay

## 2019-04-12 DIAGNOSIS — R262 Difficulty in walking, not elsewhere classified: Secondary | ICD-10-CM | POA: Diagnosis present

## 2019-04-12 DIAGNOSIS — M25571 Pain in right ankle and joints of right foot: Secondary | ICD-10-CM | POA: Diagnosis present

## 2019-04-12 DIAGNOSIS — M25671 Stiffness of right ankle, not elsewhere classified: Secondary | ICD-10-CM | POA: Diagnosis present

## 2019-04-12 NOTE — Therapy (Signed)
[facility] [address] [place], [place], [address] Phone: [phone]   Fax:  [phone]  Physical Therapy Evaluation  Patient Details  Name: [name] MRN: [id number] Date of Birth: August 03, 1960 Referring Provider (PT): Zonia KiefJames Owens, PA-C   Encounter Date: 04/12/2019  PT End of Session - 04/12/19 1307    Visit Number  1    Number of Visits  12    Date for PT Re-Evaluation  05/24/19    Authorization Type  [employer]    PT Start Time  1145    PT Stop Time  1231    PT Time Calculation (min)  46 min    Activity Tolerance  Patient tolerated treatment well    Behavior During Therapy  [facility] for tasks assessed/performed       Past Medical History:  Diagnosis Date  . Anxiety   . Asthma   . Chronic kidney disease   . Complication of anesthesia    was slow to wake up with one of her surgeries  . Cough   . Headache    hx. migraines  . Hyperventilation   . Insomnia   . Pleurisy   . UTI (urinary tract infection)     Past Surgical History:  Procedure Laterality Date  . LUMBAR LAMINECTOMY N/A 07/04/2015   Procedure: Left L5-S1 MICRODISCECTOMY;  Surgeon: Kerrin ChampagneJames E Nitka, MD;  [facility];  Service: Orthopedics;  Laterality: N/A;  . LUMBAR SPINE SURGERY    . UTERINE FIBROID SURGERY    . VESICOVAGINAL FISTULA CLOSURE W/ TAH      There were no vitals filed for this visit.   Subjective Assessment - 04/12/19 1254    Subjective  Pt. reports onset of right heel and medial foot pain in August associated with using stepper cardio machine at home during pandemic. Pt. initially wore CAM boot as needed but has been wearing consistently for the past 4 weeks. She was diagnosed with both plantar fasciitis and posterior tibial tendonitis. She had foot injected about 2 weeks ago but has had continued pain.    Pertinent History  lumbar surgery, asthma, anxiety    Limitations  Walking;Standing    Currently in Pain?  Yes    Pain Score  7      Pain Location  Foot    Pain Orientation  Right   medial ankle and plantar aspect of heel   Pain Descriptors / Indicators  Aching;Burning;Dull;Sharp    Pain Type  Acute pain    Pain Onset  More than a month ago    Pain Frequency  Constant    Aggravating Factors   standing and walking    Pain Relieving Factors  rest and ice    Effect of Pain on Daily Activities  limits ability/tolerance for standing and walking for IADLs and community mobility         [facility] PT Assessment - 04/12/19 0001      Assessment   Medical Diagnosis  Right foot plantar fasciitis, posterior tibial tendonitis    Referring Provider (PT)  Zonia KiefJames Owens, PA-C    Onset Date/Surgical Date  02/05/19    Next MD Visit  05/03/2019    Prior Therapy  --   past PT for back but no PT for foot     Precautions   Precautions  None    Required Braces or Orthoses  --   CAM boot RLE     Restrictions   Weight Bearing Restrictions  No  Balance Screen   Has the patient fallen in the past 6 months  No      [place] residence    Type of [place] Access  Level entry    Home Layout  Two level      Prior Function   Level of Independence  Independent with community mobility without device      Cognition   Overall Cognitive Status  Within Functional Limits for tasks assessed      Observation/Other Assessments   Focus on Therapeutic Outcomes (FOTO)   53% limited      Observation/Other Assessments-Edema    Edema  Figure 8      Figure 8 Edema   Figure 8 - Right   50 cm    Figure 8 - Left   49 cm      Sensation   Light Touch  Appears Intact      ROM / Strength   AROM / PROM / Strength  AROM;PROM;Strength      AROM   AROM Assessment Site  Ankle    Right/Left Ankle  Right;Left    Right Ankle Dorsiflexion  -4    Right Ankle Plantar Flexion  60    Right Ankle Inversion  23    Right Ankle Eversion  20    Left Ankle Dorsiflexion  0    Left Ankle Plantar Flexion  60     Left Ankle Inversion  40    Left Ankle Eversion  25      PROM   PROM Assessment Site  Ankle    Right/Left Ankle  Right    Right Ankle Dorsiflexion  -2      Strength   Strength Assessment Site  Ankle    Right/Left Ankle  Right;Left    Right Ankle Dorsiflexion  4/5    Right Ankle Plantar Flexion  --   4-/5 supine only   Right Ankle Inversion  4-/5    Right Ankle Eversion  4-/5    Left Ankle Dorsiflexion  5/5    Left Ankle Plantar Flexion  5/5    Left Ankle Inversion  5/5    Left Ankle Eversion  5/5      Flexibility   Soft Tissue Assessment /Muscle Length  --   tight right gastroc and soleus     Palpation   Palpation comment  Tender to palpation right medial ankle along posterior tibial tendon region, tight and tender to palpation plantar ascpect of heel/plantar fascia and trigger points noted right medial gastroc      Ambulation/Gait   Gait Comments  Pt. ambulates mod I with CAM boot-assessed gait without footwear and pt. walks with foot plantar flexed/unable to achieve heelstrike on right, pes planus noted on left but limited ability to assess stance on right due to tightness/ankle ROM limitations                Objective measurements completed on examination: See above findings.      [place] Adult PT Treatment/Exercise - 04/12/19 0001      Exercises   Exercises  Ankle      Ankle Exercises: Stretches   Gastroc Stretch  2 reps;30 seconds    Gastroc Stretch Limitations  longsitting stretch with strap    Other Stretch  seated right foot plantar fascia stretch with right hip and knee in figure 4 position for HEP practice    Other Stretch  also HEP instruction standing variations for gastroc and plantar fascia stretches using step      Ankle Exercises: Seated   Other Seated Ankle Exercises  HEP instruction and brief practice gentle ankle isometrics in longsitting for ankle IV, EV, PF using pillow             PT Education - 04/12/19 1306    Education  Details  symptom etiology, HEP, POC, use of tennis ball vs. frozen plastic water ball for seated plantar fascia self-STM    Person(s) Educated  Patient    Methods  Explanation;Demonstration;Tactile cues;Verbal cues;Handout    Comprehension  Verbalized understanding;Returned demonstration          PT Long Term Goals - 04/12/19 1314      PT LONG TERM GOAL #1   Title  Independent with HEP    Baseline  needs HEP    Time  6    Period  Weeks    Status  New    Target Date  05/24/19      PT LONG TERM GOAL #2   Title  Improve FOTO outcome measure score to 29% or less impairment    Baseline  53% limited    Time  6    Period  Weeks    Status  New    Target Date  05/24/19      PT LONG TERM GOAL #3   Title  Independent community level ambulation without CAM boot with right foot and ankle pain 3/10 or less    Baseline  ambulates mod I with CAM boot, pain 7/10    Time  6    Period  Weeks    Status  New    Target Date  05/24/19      PT LONG TERM GOAL #4   Title  Increase right ankle dorsiflexion AROM to at least neutral to improve ability for heel strike with gait/improve toe clearance    Baseline  -4 deg    Time  6    Period  Weeks    Status  New    Target Date  05/24/19      PT LONG TERM GOAL #5   Title  Right ankle strength for IV, EV, DF grossly 5/5 to improve ankle stability for ambulation over uneven surfaces    Baseline  4-/5 IV, EV, 4/5 DF    Time  6    Period  Weeks    Status  New    Target Date  05/24/19             Plan - 04/12/19 1308    Clinical Impression Statement  Pt. presents with right ankle and foot pain consistent with referring diagnosis plantar fasciitis and posterior tibial tendonitis with eval findings of right ankle decreased ROM, muscle tightness and weakness with underlying pes planus. Pt. would benefit from PT to help relieve pain and address current functional limitations for mobility.    Personal Factors and Comorbidities  --   pes planus    Examination-Activity Limitations  Bathing;Squat;Stairs;Stand;Locomotion Level;Dressing;Transfers;Carry    Examination-Participation Restrictions  Shop;Laundry;Cleaning;Community Activity;Meal Prep;Driving    Stability/Clinical Decision Making  Stable/Uncomplicated    Clinical Decision Making  Low    Rehab Potential  Good    PT Frequency  --   1-2x/week (limited per pt. request)   PT Duration  6 weeks    PT Treatment/Interventions  Electrical Stimulation;ADLs/Self Care Home Management;Iontophoresis 4mg /ml Dexamethasone;Moist Heat;Ultrasound;Cryotherapy;Gait training;Stair training;Therapeutic activities;Functional mobility training;Therapeutic exercise;Neuromuscular re-education;Patient/family education;Manual  techniques;Dry needling;Passive range of motion;Taping;Vasopneumatic Device    PT Next Visit Plan  review HEP as needed, stretches right gastroc/soleus, when tolerated progress ankle isometrics to T-band, [place] plantar fascia and posterior tibial tendon region, STM/IASTM plantar fascia and medial gastroc, possible ionto when ceritfication signed    PT Home Exercise Plan  longsitting gastroc/soleus stretches, seated plantar fascia stretch (standing alternative with step), gentle isometrics for ankle IV, EV, PF    Consulted and Agree with Plan of Care  Patient       Patient will benefit from skilled therapeutic intervention in order to improve the following deficits and impairments:  Pain, Impaired flexibility, Decreased strength, Decreased activity tolerance, Decreased range of motion, Increased edema, Difficulty walking  Visit Diagnosis: Pain in right ankle and joints of right foot  Stiffness of right ankle, not elsewhere classified  Difficulty in walking, not elsewhere classified     Problem List Patient Active Problem List   Diagnosis Date Noted  . Sinusitis 04/10/2018  . Asthma exacerbation 03/28/2018  . SOB (shortness of breath)   . Pyelonephritis 04/15/2016  . Normochromic  normocytic anemia 04/15/2016  . Elevated LFTs 04/15/2016  . HNP (herniated nucleus pulposus), lumbar 07/04/2015    Class: Acute  . Anxiety state 08/08/2013  . INSOMNIA 05/20/2008  . Asthma with bronchitis 05/15/2007  . PLEURISY 05/15/2007  . HYPERVENTILATION 05/15/2007  . COUGH 05/15/2007   Lazarus Gowda, PT, DPT 04/12/19 1:19 PM  [facility] [address] [place], [place], [address] Phone: [phone]   Fax:  [phone]  Name: [name] MRN: [id number] Date of Birth: [birth year]

## 2019-04-18 ENCOUNTER — Ambulatory Visit: Payer: Commercial Managed Care - PPO | Admitting: Physical Therapy

## 2019-04-25 ENCOUNTER — Other Ambulatory Visit: Payer: Self-pay

## 2019-04-25 ENCOUNTER — Ambulatory Visit: Payer: Commercial Managed Care - PPO | Admitting: Physical Therapy

## 2019-04-25 ENCOUNTER — Encounter: Payer: Self-pay | Admitting: Physical Therapy

## 2019-04-25 DIAGNOSIS — M25671 Stiffness of right ankle, not elsewhere classified: Secondary | ICD-10-CM

## 2019-04-25 DIAGNOSIS — M25571 Pain in right ankle and joints of right foot: Secondary | ICD-10-CM | POA: Diagnosis not present

## 2019-04-25 DIAGNOSIS — R262 Difficulty in walking, not elsewhere classified: Secondary | ICD-10-CM

## 2019-04-25 NOTE — Therapy (Signed)
[facility] [address] [place], [place], [address] Phone: [phone]   Fax:  [phone]  Physical Therapy Treatment  Patient Details  Name: [name] MRN: [id number] Date of Birth: 05-11-1961 Referring Provider (PT): Zonia Kief, PA-C   Encounter Date: 04/25/2019  PT End of Session - 04/25/19 1246    Visit Number  2    Number of Visits  12    Date for PT Re-Evaluation  05/24/19    Authorization Type  UHC    PT Start Time  1240    PT Stop Time  1328    PT Time Calculation (min)  48 min    Activity Tolerance  Patient tolerated treatment well;Patient limited by pain    Behavior During Therapy  [facility] for tasks assessed/performed       Past Medical History:  Diagnosis Date  . Anxiety   . Asthma   . Chronic kidney disease   . Complication of anesthesia    was slow to wake up with one of her surgeries  . Cough   . Headache    hx. migraines  . Hyperventilation   . Insomnia   . Pleurisy   . UTI (urinary tract infection)     Past Surgical History:  Procedure Laterality Date  . LUMBAR LAMINECTOMY N/A 07/04/2015   Procedure: Left L5-S1 MICRODISCECTOMY;  Surgeon: Kerrin Champagne, MD;  Location: [facility] OR;  Service: Orthopedics;  Laterality: N/A;  . LUMBAR SPINE SURGERY    . UTERINE FIBROID SURGERY    . VESICOVAGINAL FISTULA CLOSURE W/ TAH      There were no vitals filed for this visit.  Subjective Assessment - 04/25/19 1141    Subjective  Patient reports her foot is better but still hurting. Exercises are going well, she feels she may stretch it too much some days. She states that her ankle/foot wil start throbbing when she is sitting, and she will have pain on the bottom of the heel.    Currently in Pain?  Yes    Pain Score  6     Pain Location  Ankle    Pain Orientation  Right;Medial    Pain Descriptors / Indicators  Throbbing    Pain Type  Chronic pain    Pain Onset  More than a month ago    Pain Frequency   Constant    Aggravating Factors   any weight bearing task, sitting    Pain Relieving Factors  Ice         [facility] PT Assessment - 04/25/19 0001      ROM / Strength   AROM / PROM / Strength  AROM      AROM   AROM Assessment Site  Ankle    Right/Left Ankle  Right    Right Ankle Dorsiflexion  -2      PROM   PROM Assessment Site  Ankle    Right/Left Ankle  Right    Right Ankle Dorsiflexion  0   increased medial ankle pain with over pressure                  [facility] Adult PT Treatment/Exercise - 04/25/19 0001      Ambulation/Gait   Gait Comments  Patient ambulates with CAM boot, when assessed barefoot she is still unable to bear weight through right heel due to increased pain      Exercises   Exercises  Ankle;Knee/Hip      Knee/Hip Exercises: Sidelying  Clams  2x15 with yellow band       Knee/Hip Exercises: Prone   Other Prone Exercises  Quadruped hip extension 2x15      Modalities   Modalities  Vasopneumatic      Vasopneumatic   Number Minutes Vasopneumatic   15 minutes    Vasopnuematic Location   Ankle    Vasopneumatic Pressure  Medium    Vasopneumatic Temperature   34      Ankle Exercises: Stretches   Plantar Fascia Stretch  2 reps;30 seconds    Plantar Fascia Stretch Limitations  seated in firgure-4 position    Gastroc Stretch  2 reps;30 seconds    Gastroc Stretch Limitations  perform both longsitting stretch with towel and standing      Ankle Exercises: Seated   Heel Raises  15 reps    Heel Raises Limitations  tennis ball squeeze between heels      Ankle Exercises: Supine   Isometrics  Inversion against ball x10 5 sec hold   Longsitting   T-Band  Plantaflexion and inversion with yellow band x15 each   Longsitting            PT Education - 04/25/19 1229    Education Details  HEP, symptom monitoring for exercise intensity    Person(s) Educated  Patient    Methods  Explanation;Demonstration;Verbal cues;Handout    Comprehension   Verbalized understanding;Verbal cues required;Need further instruction          PT Long Term Goals - 04/12/19 1314      PT LONG TERM GOAL #1   Title  Independent with HEP    Baseline  needs HEP    Time  6    Period  Weeks    Status  New    Target Date  05/24/19      PT LONG TERM GOAL #2   Title  Improve FOTO outcome measure score to 29% or less impairment    Baseline  53% limited    Time  6    Period  Weeks    Status  New    Target Date  05/24/19      PT LONG TERM GOAL #3   Title  Independent community level ambulation without CAM boot with right foot and ankle pain 3/10 or less    Baseline  ambulates mod I with CAM boot, pain 7/10    Time  6    Period  Weeks    Status  New    Target Date  05/24/19      PT LONG TERM GOAL #4   Title  Increase right ankle dorsiflexion AROM to at least neutral to improve ability for heel strike with gait/improve toe clearance    Baseline  -4 deg    Time  6    Period  Weeks    Status  New    Target Date  05/24/19      PT LONG TERM GOAL #5   Title  Right ankle strength for IV, EV, DF grossly 5/5 to improve ankle stability for ambulation over uneven surfaces    Baseline  4-/5 IV, EV, 4/5 DF    Time  6    Period  Weeks    Status  New    Target Date  05/24/19            Plan - 04/25/19 1247    Clinical Impression Statement  Patient continues to present with highly irritable medial ankle pain that seems  conistent with post. tib tendinopathy. She was able to progress to isotonic exercises with band for post tib. She continues to exhibit limited DF range of motion but has improved since last visit. She was encouraged to monitor her symptoms pre- and post- exercise to determine the intensity she performs or if she should rest. She reported reduce throbbing sensation post therapy with the use of Gameready. She would benefit from continued skilled PT to reduce pain and progress activity tolerance.    PT Treatment/Interventions  Electrical  Stimulation;ADLs/Self Care Home Management;Iontophoresis 4mg /ml Dexamethasone;Moist Heat;Ultrasound;Cryotherapy;Gait training;Stair training;Therapeutic activities;Functional mobility training;Therapeutic exercise;Neuromuscular re-education;Patient/family education;Manual techniques;Dry needling;Passive range of motion;Taping;Vasopneumatic Device    PT Next Visit Plan  review HEP and progress as able, gastroc/soleus stretching and manual therapy PRN, progress ankle/post tib strengthening, trial ionto if POC is signed by referring doc    PT Home Exercise Plan  Longsitting or standing gastroc/soleus stretches, seated figure-4 plantar fascia stretch (standing alternative with step), banded PF and inv with yellow, seated heel raises with tennis ball squeeze, clamshell with yellow band, quadruped hip extension    Consulted and Agree with Plan of Care  Patient       Patient will benefit from skilled therapeutic intervention in order to improve the following deficits and impairments:  Pain, Impaired flexibility, Decreased strength, Decreased activity tolerance, Decreased range of motion, Increased edema, Difficulty walking  Visit Diagnosis: Pain in right ankle and joints of right foot  Stiffness of right ankle, not elsewhere classified  Difficulty in walking, not elsewhere classified     Problem List Patient Active Problem List   Diagnosis Date Noted  . Sinusitis 04/10/2018  . Asthma exacerbation 03/28/2018  . SOB (shortness of breath)   . Pyelonephritis 04/15/2016  . Normochromic normocytic anemia 04/15/2016  . Elevated LFTs 04/15/2016  . HNP (herniated nucleus pulposus), lumbar 07/04/2015    Class: Acute  . Anxiety state 08/08/2013  . INSOMNIA 05/20/2008  . Asthma with bronchitis 05/15/2007  . PLEURISY 05/15/2007  . HYPERVENTILATION 05/15/2007  . COUGH 05/15/2007    Hilda Blades, PT, DPT, LAT, ATC 04/25/19  12:57 PM Phone: [phone] Fax: [facility] [address] [place], [place], [address] Phone: [phone]   Fax:  [phone]  Name: [name] MRN: [id number] Date of Birth: [birth year]

## 2019-04-25 NOTE — Patient Instructions (Signed)
Access Code: [email or web address]  URL: [email or web address]  Date: 04/25/2019  Prepared by: Hilda Blades   Exercises Long Sitting Ankle Plantar Flexion with Resistance - 15 reps - 2-3x daily - 4-5x weekly Seated Ankle Inversion with Resistance and Legs Crossed - 15 reps - 2-3x daily - 4-5x weekly Seated Heel Raise - 15 reps - 2-3x daily - 4-5x weekly Clamshell with Resistance - 15 reps - 2-3 sets - 1x daily - 4-5x weekly Beginner Front Arm Support - 15 reps - 2-3 sets - 1x daily - 4-5x weekly Quadruped Hip Extension Kicks - 15 reps - 2-3 sets - 1x daily - 7x weekly

## 2019-05-02 ENCOUNTER — Other Ambulatory Visit: Payer: Self-pay

## 2019-05-02 ENCOUNTER — Encounter: Payer: Self-pay | Admitting: Physical Therapy

## 2019-05-02 ENCOUNTER — Ambulatory Visit: Payer: Commercial Managed Care - PPO | Attending: Surgery | Admitting: Physical Therapy

## 2019-05-02 DIAGNOSIS — M25671 Stiffness of right ankle, not elsewhere classified: Secondary | ICD-10-CM | POA: Diagnosis present

## 2019-05-02 DIAGNOSIS — M25571 Pain in right ankle and joints of right foot: Secondary | ICD-10-CM

## 2019-05-02 DIAGNOSIS — R262 Difficulty in walking, not elsewhere classified: Secondary | ICD-10-CM | POA: Insufficient documentation

## 2019-05-02 NOTE — Therapy (Signed)
[facility] [address] [place], [place], [address] Phone: [phone]   Fax:  [phone]  Physical Therapy Treatment  Patient Details  Name: [name] MRN: [id number] Date of Birth: 11-24-1960 Referring Provider (PT): Zonia Kief, PA-C   Encounter Date: 05/02/2019  PT End of Session - 05/02/19 1228    Visit Number  3    Number of Visits  12    Date for PT Re-Evaluation  05/24/19    Authorization Type  UHC    PT Start Time  [phone]   late arrival   PT Stop Time  1015    PT Time Calculation (min)  36 min    Activity Tolerance  Patient limited by pain    Behavior During Therapy  [facility] for tasks assessed/performed       Past Medical History:  Diagnosis Date  . Anxiety   . Asthma   . Chronic kidney disease   . Complication of anesthesia    was slow to wake up with one of her surgeries  . Cough   . Headache    hx. migraines  . Hyperventilation   . Insomnia   . Pleurisy   . UTI (urinary tract infection)     Past Surgical History:  Procedure Laterality Date  . LUMBAR LAMINECTOMY N/A 07/04/2015   Procedure: Left L5-S1 MICRODISCECTOMY;  Surgeon: Kerrin Champagne, MD;  Location: [facility] OR;  Service: Orthopedics;  Laterality: N/A;  . LUMBAR SPINE SURGERY    . UTERINE FIBROID SURGERY    . VESICOVAGINAL FISTULA CLOSURE W/ TAH      There were no vitals filed for this visit.  Subjective Assessment - 05/02/19 1200    Subjective  Pt. reports significant increase in her foot pain today, no clear exacerbating cause. She continues with use of CAM boot for community mobility but walking without at home. Message sent to Lestine Mount, PA-C regarding iontophoresis with reply received with permission to try iontophoresis treatment.    Pertinent History  lumbar surgery, asthma, anxiety    Limitations  Walking;Standing    Currently in Pain?  Yes    Pain Score  9     Pain Location  Ankle    Pain Orientation  Right;Medial    Pain Descriptors  / Indicators  Throbbing    Pain Type  Chronic pain    Pain Onset  More than a month ago    Pain Frequency  Constant    Aggravating Factors   standing and walking    Pain Relieving Factors  rest, ice    Effect of Pain on Daily Activities  limits ability and tolerance for standing and walking                       [facility] Adult PT Treatment/Exercise - 05/02/19 0001      Modalities   Modalities  Iontophoresis;Ultrasound      Ultrasound   Ultrasound Location  right medial ankle and heel    Ultrasound Parameters  3.3 MHZ 50% 1.0 W/cm2 x 8 minutes    Ultrasound Goals  Edema;Pain      Iontophoresis   Type of Iontophoresis  Dexamethasone    Location  right medial ankle    Dose  --   4 mg/mL, 80 mA/min take home patch   Time  4      Manual Therapy   Manual Therapy  Soft tissue mobilization    Soft tissue mobilization  STM/IASTM incl. roller use right gastrocnemius and plantar fascia, trigger point ischemic compression right medial gastroc      Ankle Exercises: Stretches   Soleus Stretch  3 reps;30 seconds    Gastroc Stretch  3 reps;30 seconds             PT Education - 05/02/19 1227    Education Details  iontophoresis-monitor skin, POC    Person(s) Educated  Patient    Methods  Explanation    Comprehension  Verbalized understanding          PT Long Term Goals - 04/12/19 1314      PT LONG TERM GOAL #1   Title  Independent with HEP    Baseline  needs HEP    Time  6    Period  Weeks    Status  New    Target Date  05/24/19      PT LONG TERM GOAL #2   Title  Improve FOTO outcome measure score to 29% or less impairment    Baseline  53% limited    Time  6    Period  Weeks    Status  New    Target Date  05/24/19      PT LONG TERM GOAL #3   Title  Independent community level ambulation without CAM boot with right foot and ankle pain 3/10 or less    Baseline  ambulates mod I with CAM boot, pain 7/10    Time  6    Period  Weeks    Status  New     Target Date  05/24/19      PT LONG TERM GOAL #4   Title  Increase right ankle dorsiflexion AROM to at least neutral to improve ability for heel strike with gait/improve toe clearance    Baseline  -4 deg    Time  6    Period  Weeks    Status  New    Target Date  05/24/19      PT LONG TERM GOAL #5   Title  Right ankle strength for IV, EV, DF grossly 5/5 to improve ankle stability for ambulation over uneven surfaces    Baseline  4-/5 IV, EV, 4/5 DF    Time  6    Period  Weeks    Status  New    Target Date  05/24/19            Plan - 05/02/19 1228    Clinical Impression Statement  Some setback today with increased right foot/ankle pain and soreness. More passive tx. focus with modalities given high pain level incuding trial iontophoresis. Plan await further response by next tx. and continue include as found beneficial with further exercise progression pending pain level and activity tolerance at next session.    Examination-Activity Limitations  Bathing;Squat;Stairs;Stand;Locomotion Level;Dressing;Transfers;Carry    Examination-Participation Restrictions  Shop;Laundry;Cleaning;Community Activity;Meal Prep;Driving    Stability/Clinical Decision Making  Stable/Uncomplicated    Clinical Decision Making  Low    Rehab Potential  Good    PT Frequency  --   1-2x/week   PT Duration  6 weeks    PT Treatment/Interventions  Electrical Stimulation;ADLs/Self Care Home Management;Iontophoresis 4mg /ml Dexamethasone;Moist Heat;Ultrasound;Cryotherapy;Gait training;Stair training;Therapeutic activities;Functional mobility training;Therapeutic exercise;Neuromuscular re-education;Patient/family education;Manual techniques;Dry needling;Passive range of motion;Taping;Vasopneumatic Device    PT Next Visit Plan  check response ionto and continue to include as found beneficial, continue US as needed, pending pain progress previous isotonics for ankle, continue stretches and manual  PT Home Exercise Plan   Longsitting or standing gastroc/soleus stretches, seated figure-4 plantar fascia stretch (standing alternative with step), banded PF and inv with yellow, seated heel raises with tennis ball squeeze, clamshell with yellow band, quadruped hip extension    Consulted and Agree with Plan of Care  Patient       Patient will benefit from skilled therapeutic intervention in order to improve the following deficits and impairments:  Pain, Impaired flexibility, Decreased strength, Decreased activity tolerance, Decreased range of motion, Increased edema, Difficulty walking  Visit Diagnosis: Pain in right ankle and joints of right foot  Stiffness of right ankle, not elsewhere classified  Difficulty in walking, not elsewhere classified     Problem List Patient Active Problem List   Diagnosis Date Noted  . Sinusitis 04/10/2018  . Asthma exacerbation 03/28/2018  . SOB (shortness of breath)   . Pyelonephritis 04/15/2016  . Normochromic normocytic anemia 04/15/2016  . Elevated LFTs 04/15/2016  . HNP (herniated nucleus pulposus), lumbar 07/04/2015    Class: Acute  . Anxiety state 08/08/2013  . INSOMNIA 05/20/2008  . Asthma with bronchitis 05/15/2007  . PLEURISY 05/15/2007  . HYPERVENTILATION 05/15/2007  . COUGH 05/15/2007    Beaulah Dinning, PT, DPT 05/02/19 12:32 PM  [facility] [address] [place], [place], [address] Phone: [phone]   Fax:  [phone]  Name: TALEEN PROSSER MRN: [id number] Date of Birth: October 12, 1960

## 2019-05-03 ENCOUNTER — Encounter: Payer: Self-pay | Admitting: Surgery

## 2019-05-03 ENCOUNTER — Ambulatory Visit: Payer: Commercial Managed Care - PPO | Admitting: Surgery

## 2019-05-03 DIAGNOSIS — M76821 Posterior tibial tendinitis, right leg: Secondary | ICD-10-CM | POA: Diagnosis not present

## 2019-05-03 DIAGNOSIS — M7671 Peroneal tendinitis, right leg: Secondary | ICD-10-CM

## 2019-05-03 DIAGNOSIS — M722 Plantar fascial fibromatosis: Secondary | ICD-10-CM | POA: Diagnosis not present

## 2019-05-03 MED ORDER — TRAMADOL HCL 50 MG PO TABS: 50.0000 mg | ORAL_TABLET | Freq: Four times a day (QID) | ORAL | 0 refills | Status: DC | PRN

## 2019-05-03 NOTE — Progress Notes (Signed)
58 year old black female returns for recheck of her right ankle and foot pain.  States that she has not had any improvement with physical therapy or with the previous Marcaine/Depo-Medrol plantar fascia injection.  She was seen by the rheumatologist Dr. Gavin Pound and patient states that she does not have lupus.  She did previously have a positive ANA.  Exam Gait is antalgic.  Patient has marked tenderness over the right foot peroneal and posterior tibial tendons.  Still very tender at the plantar fascia calcaneal insertion.  Plan Patient has not had any improvement with conservative management recommend getting a MRI of the right ankle/foot to better evaluate the peroneal and posterior tibial tendons and rule out plantar fascial tear.  We will schedule follow-up visit with Dr. Meridee Score to discuss results of the scan along with further treatment options.  Recommend that she go back into her boot until she sees Dr. Sharol Given.  Called in prescription for Ultram.  She was given a note taken out of work x2 weeks until her visit with Dr. Sharol Given.

## 2019-05-09 ENCOUNTER — Ambulatory Visit: Payer: Commercial Managed Care - PPO | Admitting: Physical Therapy

## 2019-05-14 ENCOUNTER — Emergency Department (HOSPITAL_BASED_OUTPATIENT_CLINIC_OR_DEPARTMENT_OTHER)
Admission: EM | Admit: 2019-05-14 | Discharge: 2019-05-14 | Discharge status: Absent without leave | Payer: Commercial Managed Care - PPO

## 2019-05-14 ENCOUNTER — Encounter: Payer: Self-pay | Admitting: Family Medicine

## 2019-05-14 ENCOUNTER — Other Ambulatory Visit: Payer: Self-pay

## 2019-05-14 ENCOUNTER — Ambulatory Visit: Payer: Self-pay

## 2019-05-14 ENCOUNTER — Other Ambulatory Visit: Payer: Self-pay | Admitting: Family Medicine

## 2019-05-14 ENCOUNTER — Ambulatory Visit (HOSPITAL_BASED_OUTPATIENT_CLINIC_OR_DEPARTMENT_OTHER)
Admission: RE | Admit: 2019-05-14 | Discharge: 2019-05-14 | Disposition: A | Discharge status: Home / Self Care | Payer: Commercial Managed Care - PPO | Source: Ambulatory Visit | Attending: Family Medicine | Admitting: Family Medicine

## 2019-05-14 ENCOUNTER — Ambulatory Visit: Payer: Commercial Managed Care - PPO | Admitting: Family Medicine

## 2019-05-14 DIAGNOSIS — M79671 Pain in right foot: Secondary | ICD-10-CM | POA: Insufficient documentation

## 2019-05-14 MED ORDER — TRAMADOL HCL 50 MG PO TABS: 50.0000 mg | ORAL_TABLET | Freq: Four times a day (QID) | ORAL | 0 refills | Status: DC | PRN

## 2019-05-14 NOTE — Progress Notes (Signed)
Office Visit Note   Patient: [name]           Date of Birth: 1960-08-15           MRN: [id number] Visit Date: 05/14/2019 Requested by: Newton Pigg, [place],  [place] [id number] PCP: Newton Pigg, MD  Subjective: Chief Complaint  Patient presents with  . Right Foot - Pain    Has appointment with Dr. Sharol Given this Thursday. The pain was so bad in the foot over the weekend that she could not wait until then.    HPI: She is here with severe right ankle pain.  She had a plantar fascia injection which did not help.  She is scheduled to meet with Dr. Sharol Given later this week but her pain has become so severe that she had to come in today.  Pain and swelling on the medial side of her ankle.  Pain also seems to be radiating up her leg.              ROS: No fevers or chills.  All other systems were reviewed and are negative.  Objective: Vital Signs: There were no vitals taken for this visit.  Physical Exam:  General:  Alert and oriented, in no acute distress. Pulm:  Breathing unlabored. Psy:  Normal mood, congruent affect. Skin: No rash or erythema. Right ankle: It is warm to touch over the tibialis posterior tendon, she has swelling there and exquisite tenderness.  She has quite a bit of pain with supination of the foot against resistance, or with any active or passive movement of the ankle.  Imaging: Limited diagnostic ultrasound right ankle: She has tenosynovitis around the tibialis posterior tendon.  The tendon does not look torn.  Superficial to the tendon, I question whether she might have a venous thrombus.  I am not qualified to do diagnostic vascular examination.  Assessment & Plan: 1.  Severe right medial ankle pain, suspect tibialis posterior tenosynovitis.  Cannot rule out DVT. -Vascular Doppler to rule out blood clot.  If negative, could do steroid injection into the tibialis posterior tendon sheath. -Crutches or kneeling walker for  ambulation.  She will keep her appointment with Dr. Sharol Given later this week.     Procedures: No procedures performed  No notes on file     PMFS History: Patient Active Problem List   Diagnosis Date Noted  . Sinusitis 04/10/2018  . Asthma exacerbation 03/28/2018  . SOB (shortness of breath)   . Pyelonephritis 04/15/2016  . Normochromic normocytic anemia 04/15/2016  . Elevated LFTs 04/15/2016  . HNP (herniated nucleus pulposus), lumbar 07/04/2015    Class: Acute  . Anxiety state 08/08/2013  . INSOMNIA 05/20/2008  . Asthma with bronchitis 05/15/2007  . PLEURISY 05/15/2007  . HYPERVENTILATION 05/15/2007  . COUGH 05/15/2007   Past Medical History:  Diagnosis Date  . Anxiety   . Asthma   . Chronic kidney disease   . Complication of anesthesia    was slow to wake up with one of her surgeries  . Cough   . Headache    hx. migraines  . Hyperventilation   . Insomnia   . Pleurisy   . UTI (urinary tract infection)     Family History  Problem Relation Age of Onset  . Prostate cancer Father   . Allergic rhinitis Mother   . Breast cancer Mother 35  . Angioedema Neg Hx   . Eczema Neg Hx   . Urticaria  Neg Hx   . Asthma Neg Hx     Past Surgical History:  Procedure Laterality Date  . LUMBAR LAMINECTOMY N/A 07/04/2015   Procedure: Left L5-S1 MICRODISCECTOMY;  Surgeon: Kerrin Champagne, MD;  Location: [facility] OR;  Service: Orthopedics;  Laterality: N/A;  . LUMBAR SPINE SURGERY    . UTERINE FIBROID SURGERY    . VESICOVAGINAL FISTULA CLOSURE W/ TAH     Social History   Occupational History  . Occupation: marketing/sales  Tobacco Use  . Smoking status: Never Smoker  . Smokeless tobacco: Never Used  Substance and Sexual Activity  . Alcohol use: No  . Drug use: No  . Sexual activity: Not on file

## 2019-05-15 ENCOUNTER — Telehealth: Payer: Self-pay | Admitting: Family Medicine

## 2019-05-15 NOTE — Telephone Encounter (Signed)
Patient called wanting to get results of her [place].  CB#[phone].  Thank you.

## 2019-05-15 NOTE — Telephone Encounter (Signed)
The results are under the cv tab - not sure if you have seen them yet.

## 2019-05-15 NOTE — Telephone Encounter (Signed)
No blood clot.  Can come in for injection if she wants.

## 2019-05-15 NOTE — Telephone Encounter (Signed)
I called and advised the patient. She is choosing to wait until her appointment with Dr. Sharol Given this Thursday.

## 2019-05-16 ENCOUNTER — Ambulatory Visit: Payer: Commercial Managed Care - PPO | Admitting: Physical Therapy

## 2019-05-17 ENCOUNTER — Encounter: Payer: Self-pay | Admitting: Orthopedic Surgery

## 2019-05-17 ENCOUNTER — Other Ambulatory Visit: Payer: Self-pay

## 2019-05-17 ENCOUNTER — Ambulatory Visit: Payer: Commercial Managed Care - PPO | Admitting: Orthopedic Surgery

## 2019-05-17 VITALS — Ht 65.0 in | Wt 170.0 lb

## 2019-05-17 DIAGNOSIS — M76821 Posterior tibial tendinitis, right leg: Secondary | ICD-10-CM | POA: Diagnosis not present

## 2019-05-23 ENCOUNTER — Ambulatory Visit: Payer: Commercial Managed Care - PPO | Admitting: Physical Therapy

## 2019-05-26 ENCOUNTER — Ambulatory Visit
Admission: RE | Admit: 2019-05-26 | Discharge: 2019-05-26 | Disposition: A | Discharge status: Home / Self Care | Payer: Commercial Managed Care - PPO | Source: Ambulatory Visit | Attending: Surgery | Admitting: Surgery

## 2019-05-26 ENCOUNTER — Other Ambulatory Visit: Payer: Self-pay

## 2019-05-26 DIAGNOSIS — M76821 Posterior tibial tendinitis, right leg: Secondary | ICD-10-CM

## 2019-05-26 DIAGNOSIS — M7671 Peroneal tendinitis, right leg: Secondary | ICD-10-CM

## 2019-05-26 DIAGNOSIS — M722 Plantar fascial fibromatosis: Secondary | ICD-10-CM

## 2019-05-28 ENCOUNTER — Encounter: Payer: Self-pay | Admitting: Orthopedic Surgery

## 2019-05-28 ENCOUNTER — Ambulatory Visit: Payer: Commercial Managed Care - PPO | Admitting: Orthopedic Surgery

## 2019-05-28 ENCOUNTER — Telehealth: Payer: Self-pay | Admitting: Radiology

## 2019-05-28 ENCOUNTER — Other Ambulatory Visit: Payer: Self-pay

## 2019-05-28 VITALS — Ht 65.0 in | Wt 170.0 lb

## 2019-05-28 DIAGNOSIS — M79671 Pain in right foot: Secondary | ICD-10-CM | POA: Diagnosis not present

## 2019-05-28 DIAGNOSIS — M76821 Posterior tibial tendinitis, right leg: Secondary | ICD-10-CM | POA: Diagnosis not present

## 2019-05-28 MED ORDER — ALLOPURINOL 100 MG PO TABS: 100.0000 mg | ORAL_TABLET | Freq: Two times a day (BID) | ORAL | 3 refills | Status: DC

## 2019-05-28 MED ORDER — COLCHICINE 0.6 MG PO CAPS: 0.6000 mg | ORAL_CAPSULE | Freq: Two times a day (BID) | ORAL | 1 refills | Status: DC | PRN

## 2019-05-28 NOTE — Progress Notes (Signed)
Office Visit Note   Patient: [name]           Date of Birth: [birth year]           MRN: [id number] Visit Date: 05/28/2019              Requested by: [name], MD [address] [place],  [place] [address] PCP: [name], MD  Chief Complaint  Patient presents with  . Right Ankle - Follow-up    MRI review       HPI: Patient is a 58 year old woman who presents in follow-up for right ankle pain along the posterior tibial tendon.  Patient states she has pain to touch denies in the fever chills denies any redness.  Patient denies any pain dorsally over the ankle.  Patient states that her father has a history of gout.  Assessment & Plan: Visit Diagnoses:  1. Right foot pain   2. Posterior tibial tendinitis, right leg     Plan: Patient symptoms seem to be most consistent with gout at this time we will draw a uric acid we will call in a prescription for allopurinol and colchicine and follow-up next week.  Follow-Up Instructions: Return in about 1 week (around 06/04/2019).   Ortho Exam  Patient is alert, oriented, no adenopathy, well-dressed, normal affect, normal respiratory effort. Examination patient's foot has no cellulitis there is swelling medially over the right ankle.  Ankle joint is nontender to palpation she is very tender to palpation along the posterior tibial tendon there is swelling.  There are no ulcers no drainage.  Review MRI scan shows edema on the medial side of the ankle there is no tendon or ligament tear.  There is some small amount of fluid in the peroneus longus.  The fluid medially is in the subcutaneous tissue.  Imaging: No results found. No images are attached to the encounter.  Labs: Lab Results  Component Value Date   HGBA1C  05/14/2008    5.6 (NOTE)   The ADA recommends the following therapeutic goal for glycemic   control related to Hgb A1C measurement:   Goal of Therapy:   < 7.0% Hgb A1C   Reference: American  Diabetes Association: Clinical Practice   Recommendations 2008, Diabetes Care,  2008, 31:(Suppl 1).   ESRSEDRATE 11 03/15/2019   LABURIC 6.4 03/15/2019   REPTSTATUS 04/20/2016 FINAL 04/15/2016   REPTSTATUS 04/20/2016 FINAL 04/15/2016   CULT  04/15/2016    NO GROWTH 5 DAYS Performed at [facility]    CULT  04/15/2016    NO GROWTH 5 DAYS Performed at [facility]    [facility] ESCHERICHIA COLI (A) 04/09/2016     Lab Results  Component Value Date   ALBUMIN 3.2 (L) 04/18/2016   ALBUMIN 3.2 (L) 04/16/2016   ALBUMIN 3.3 (L) 04/15/2016   LABURIC 6.4 03/15/2019    Lab Results  Component Value Date   MG 1.9 04/18/2016   MG 2.0 04/16/2016   MG 2.1 05/16/2008   No results found for: VD25OH  No results found for: PREALBUMIN CBC EXTENDED Latest Ref Rng & Units 03/15/2019 04/10/2018 04/19/2016  WBC 3.8 - 10.8 Thousand/uL 4.3 6.4 5.5  RBC 3.80 - 5.10 Million/uL 4.19 4.30 3.72(L)  HGB 11.7 - 15.5 g/dL 70.1 77.9 10.5(L)  HCT 35.0 - 45.0 % 36.0 37.2 31.1(L)  PLT 140 - 400 Thousand/uL 232 188.0 337  NEUTROABS 1.4 - 7.7 K/uL - 4.2 3.6  LYMPHSABS 0.7 - 4.0 K/uL -  1.9 1.5     Body mass index is 28.29 kg/m.  Orders:  No orders of the defined types were placed in this encounter.  No orders of the defined types were placed in this encounter.    Procedures: No procedures performed  Clinical Data: No additional findings.  ROS:  All other systems negative, except as noted in the HPI. Review of Systems  Objective: Vital Signs: Ht 5\' 5"  (1.651 m)   Wt 170 lb (77.1 kg)   BMI 28.29 kg/m   Specialty Comments:  No specialty comments available.  PMFS History: Patient Active Problem List   Diagnosis Date Noted  . Sinusitis 04/10/2018  . Asthma exacerbation 03/28/2018  . SOB (shortness of breath)   . Pyelonephritis 04/15/2016  . Normochromic normocytic anemia 04/15/2016  . Elevated LFTs 04/15/2016  . HNP (herniated nucleus pulposus), lumbar 07/04/2015     Class: Acute  . Anxiety state 08/08/2013  . INSOMNIA 05/20/2008  . Asthma with bronchitis 05/15/2007  . PLEURISY 05/15/2007  . HYPERVENTILATION 05/15/2007  . COUGH 05/15/2007   Past Medical History:  Diagnosis Date  . Anxiety   . Asthma   . Chronic kidney disease   . Complication of anesthesia    was slow to wake up with one of her surgeries  . Cough   . Headache    hx. migraines  . Hyperventilation   . Insomnia   . Pleurisy   . UTI (urinary tract infection)     Family History  Problem Relation Age of Onset  . Prostate cancer Father   . Allergic rhinitis Mother   . Breast cancer Mother 77  . Angioedema Neg Hx   . Eczema Neg Hx   . Urticaria Neg Hx   . Asthma Neg Hx     Past Surgical History:  Procedure Laterality Date  . LUMBAR LAMINECTOMY N/A 07/04/2015   Procedure: Left L5-S1 MICRODISCECTOMY;  Surgeon: Jessy Oto, MD;  Location: [facility];  Service: Orthopedics;  Laterality: N/A;  . LUMBAR SPINE SURGERY    . UTERINE FIBROID SURGERY    . VESICOVAGINAL FISTULA CLOSURE W/ TAH     Social History   Occupational History  . Occupation: marketing/sales  Tobacco Use  . Smoking status: Never Smoker  . Smokeless tobacco: Never Used  Substance and Sexual Activity  . Alcohol use: No  . Drug use: No  . Sexual activity: Not on file

## 2019-05-28 NOTE — Telephone Encounter (Signed)
Left voicemail for patient.  [name] called and states that patient had appt tomorrow to review MRI.  MRI finding shows possible infection so patient will need to see Dr. Sharol Given. Appointment with Dr. Sharol Given scheduled for 1:30p this afternoon. I asked patient to please call back if not able to keep this appt.

## 2019-05-29 ENCOUNTER — Encounter: Payer: Self-pay | Admitting: Orthopedic Surgery

## 2019-05-29 ENCOUNTER — Ambulatory Visit: Payer: Commercial Managed Care - PPO | Admitting: Orthopedic Surgery

## 2019-05-29 NOTE — Progress Notes (Signed)
Office Visit Note   Patient: [name]           Date of Birth: 10-11-1960           MRN: [id number] Visit Date: 05/17/2019              Requested by: Carol Harries, MD [address] [place],  [place] [address] PCP: Carol Harries, MD  Chief Complaint  Patient presents with  . Right Foot - Pain, New Patient (Initial Visit)      HPI: Patient is a 58 year old woman who is seen in referral from Dr. Prince Mcdonald.  Patient complains of pain over the posterior tibial tendon.  She is scheduled for an MRI scan in 2 weeks.   Assessment & Plan: Visit Diagnoses:  1. Posterior tibial tendinitis, right leg     Plan: Will reevaluate after the MRI scan.  Follow-Up Instructions: Return in about 2 weeks (around 05/31/2019).   Ortho Exam  Patient is alert, oriented, no adenopathy, well-dressed, normal affect, normal respiratory effort. Examination patient has good pulses she has pain to palpation over the posterior tibial tendon there is swelling over the tendons there is no redness no cellulitis no signs of infection.  The plantar fascia is nontender to palpation.  Imaging: No results found. No images are attached to the encounter.  Labs: Lab Results  Component Value Date   HGBA1C  05/14/2008    5.6 (NOTE)   The ADA recommends the following therapeutic goal for glycemic   control related to Hgb A1C measurement:   Goal of Therapy:   < 7.0% Hgb A1C   Reference: American Diabetes Association: Clinical Practice   Recommendations 2008, Diabetes Care,  2008, 31:(Suppl 1).   ESRSEDRATE 11 03/15/2019   LABURIC 6.4 03/15/2019   REPTSTATUS 04/20/2016 FINAL 04/15/2016   REPTSTATUS 04/20/2016 FINAL 04/15/2016   CULT  04/15/2016    NO GROWTH 5 DAYS Performed at [facility]    CULT  04/15/2016    NO GROWTH 5 DAYS Performed at [facility]    [facility] ESCHERICHIA COLI (A) 04/09/2016     Lab Results  Component Value Date   ALBUMIN 3.2 (L) 04/18/2016   ALBUMIN 3.2 (L) 04/16/2016   ALBUMIN 3.3 (L) 04/15/2016   LABURIC 6.4 03/15/2019    Lab Results  Component Value Date   MG 1.9 04/18/2016   MG 2.0 04/16/2016   MG 2.1 05/16/2008   No results found for: VD25OH  No results found for: PREALBUMIN CBC EXTENDED Latest Ref Rng & Units 03/15/2019 04/10/2018 04/19/2016  WBC 3.8 - 10.8 Thousand/uL 4.3 6.4 5.5  RBC 3.80 - 5.10 Million/uL 4.19 4.30 3.72(L)  HGB 11.7 - 15.5 g/dL 03.5 46.5 10.5(L)  HCT 35.0 - 45.0 % 36.0 37.2 31.1(L)  PLT 140 - 400 Thousand/uL 232 188.0 337  NEUTROABS 1.4 - 7.7 K/uL - 4.2 3.6  LYMPHSABS 0.7 - 4.0 K/uL - 1.9 1.5     Body mass index is 28.29 kg/m.  Orders:  No orders of the defined types were placed in this encounter.  No orders of the defined types were placed in this encounter.    Procedures: No procedures performed  Clinical Data: No additional findings.  ROS:  All other systems negative, except as noted in the HPI. Review of Systems  Objective: Vital Signs: Ht 5\' 5"  (1.651 m)   Wt 170 lb (77.1 kg)   BMI 28.29 kg/m   Specialty Comments:  No specialty comments available.  PMFS History: Patient Active Problem List   Diagnosis Date Noted  . Sinusitis 04/10/2018  . Asthma exacerbation 03/28/2018  . SOB (shortness of breath)   . Pyelonephritis 04/15/2016  . Normochromic normocytic anemia 04/15/2016  . Elevated LFTs 04/15/2016  . HNP (herniated nucleus pulposus), lumbar 07/04/2015    Class: Acute  . Anxiety state 08/08/2013  . INSOMNIA 05/20/2008  . Asthma with bronchitis 05/15/2007  . PLEURISY 05/15/2007  . HYPERVENTILATION 05/15/2007  . COUGH 05/15/2007   Past Medical History:  Diagnosis Date  . Anxiety   . Asthma   . Chronic kidney disease   . Complication of anesthesia    was slow to wake up with one of her surgeries  . Cough   . Headache    hx. migraines  . Hyperventilation   . Insomnia   . Pleurisy   . UTI (urinary tract infection)     Family History  Problem  Relation Age of Onset  . Prostate cancer Father   . Allergic rhinitis Mother   . Breast cancer Mother 70  . Angioedema Neg Hx   . Eczema Neg Hx   . Urticaria Neg Hx   . Asthma Neg Hx     Past Surgical History:  Procedure Laterality Date  . LUMBAR LAMINECTOMY N/A 07/04/2015   Procedure: Left L5-S1 MICRODISCECTOMY;  Surgeon: Carol Oto, MD;  Location: [facility];  Service: Orthopedics;  Laterality: N/A;  . LUMBAR SPINE SURGERY    . UTERINE FIBROID SURGERY    . VESICOVAGINAL FISTULA CLOSURE W/ TAH     Social History   Occupational History  . Occupation: marketing/sales  Tobacco Use  . Smoking status: Never Smoker  . Smokeless tobacco: Never Used  Substance and Sexual Activity  . Alcohol use: No  . Drug use: No  . Sexual activity: Not on file

## 2019-06-04 ENCOUNTER — Ambulatory Visit: Payer: Commercial Managed Care - PPO | Admitting: Orthopedic Surgery

## 2019-06-04 ENCOUNTER — Encounter: Payer: Self-pay | Admitting: Orthopedic Surgery

## 2019-06-04 ENCOUNTER — Other Ambulatory Visit: Payer: Self-pay

## 2019-06-04 VITALS — Ht 65.0 in | Wt 170.0 lb

## 2019-06-04 DIAGNOSIS — M76821 Posterior tibial tendinitis, right leg: Secondary | ICD-10-CM | POA: Diagnosis not present

## 2019-06-04 MED ORDER — PREDNISONE 10 MG PO TABS: 20.0000 mg | ORAL_TABLET | Freq: Every day | ORAL | 0 refills | Status: DC

## 2019-06-04 NOTE — Progress Notes (Signed)
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  Patient did have an elevated ANA and rheumatology stated that she does not have a rheumatologic disease.  The uric acid was 5.8 and she had no response or improvement with using allopurinol or colchicine.  Patient complains of persistent pain and at this time its only over the posterior tibial tendon.  Assessment & Plan: Visit Diagnoses:  1. Posterior tibial tendinitis, right leg     Plan: We will start on a low-dose prednisone 20 mg with breakfast and wean off as she feels comfortable she is to use the fracture boot and crutches.  At follow-up if she is still symptomatic discussed that we may need to consider tibiotalar and subtalar fusion.  Follow-Up Instructions: Return in about 4 weeks (around 07/02/2019).   Ortho Exam  Patient is alert, oriented, no adenopathy, well-dressed, normal affect, normal respiratory effort. Examination patient has a strong dorsalis pedis pulse she has only tender to palpation at this time over the posterior tibial tendon.  The MRI scan is reviewed which shows fluid around the posterior tibial tendon as well as some swelling locally in the soft tissue.  Clinically she has no purulence no abscess no redness no cellulitis no signs of any abscess.  Clinically.  Patient cannot do a single limb heel raise and attempted single limb heel raise on the right  reproduces the symptoms that she is feeling.  Imaging: No results found. No images are attached to the encounter.  Labs: Lab Results  Component Value Date   HGBA1C  05/14/2008    5.6 (NOTE)   The ADA recommends the following therapeutic goal for glycemic   control related to Hgb A1C measurement:   Goal of Therapy:   < 7.0% Hgb A1C   Reference: American Diabetes Association: Clinical Practice   Recommendations 2008, Diabetes Care,  2008, 31:(Suppl 1).   ESRSEDRATE 11 03/15/2019   LABURIC 6.4 03/15/2019   REPTSTATUS 04/20/2016 FINAL 04/15/2016   REPTSTATUS 04/20/2016 FINAL 04/15/2016   CULT  04/15/2016    NO GROWTH 5 DAYS Performed at [facility]    CULT  04/15/2016    NO GROWTH 5 DAYS Performed at [facility]    [facility] ESCHERICHIA COLI (A) 04/09/2016     Lab Results  Component Value Date   ALBUMIN 3.2 (L) 04/18/2016   ALBUMIN 3.2 (L) 04/16/2016   ALBUMIN 3.3 (L) 04/15/2016   LABURIC 6.4 03/15/2019    Lab Results  Component Value Date   MG 1.9 04/18/2016   MG 2.0 04/16/2016   MG 2.1 05/16/2008   No results found for: VD25OH  No results found for: PREALBUMIN CBC EXTENDED Latest Ref Rng & Units 03/15/2019 04/10/2018 04/19/2016  WBC 3.8 -  10.8 Thousand/uL 4.3 6.4 5.5  RBC 3.80 - 5.10 Million/uL 4.19 4.30 3.72(L)  HGB 11.7 - 15.5 g/dL 11.9 12.5 10.5(L)  HCT 35.0 - 45.0 % 36.0 37.2 31.1(L)  PLT 140 - 400 Thousand/uL 232 188.0 337  NEUTROABS 1.4 - 7.7 K/uL - 4.2 3.6  LYMPHSABS 0.7 - 4.0 K/uL - 1.9 1.5     Body mass index is 28.29 kg/m.  Orders:  No orders of the defined types were placed in this encounter.  Meds ordered this encounter  Medications   predniSONE (DELTASONE) 10 MG tablet    Sig: Take 2 tablets (20 mg total) by mouth daily with breakfast.    Dispense:  60 tablet    Refill:  0     Procedures: No procedures performed  Clinical Data: No additional findings.  ROS:  All other systems negative, except as noted in the  HPI. Review of Systems  Objective: Vital Signs: Ht 5\' 5"  (1.651 m)    Wt 170 lb (77.1 kg)    BMI 28.29 kg/m   Specialty Comments:  No specialty comments available.  PMFS History: Patient Active Problem List   Diagnosis Date Noted   Sinusitis 04/10/2018   Asthma exacerbation 03/28/2018   SOB (shortness of breath)    Pyelonephritis 04/15/2016   Normochromic normocytic anemia 04/15/2016   Elevated LFTs 04/15/2016   HNP (herniated nucleus pulposus), lumbar 07/04/2015    Class: Acute   Anxiety state 08/08/2013   INSOMNIA 05/20/2008   Asthma with bronchitis 05/15/2007   PLEURISY 05/15/2007   HYPERVENTILATION 05/15/2007   COUGH 05/15/2007   Past Medical History:  Diagnosis Date   Anxiety    Asthma    Chronic kidney disease    Complication of anesthesia    was slow to wake up with one of her surgeries   Cough    Headache    hx. migraines   Hyperventilation    Insomnia    Pleurisy    UTI (urinary tract infection)     Family History  Problem Relation Age of Onset   Prostate cancer Father    Allergic rhinitis Mother    Breast cancer Mother 59   Angioedema Neg Hx    Eczema Neg Hx    Urticaria Neg Hx    Asthma Neg Hx     Past Surgical History:  Procedure Laterality Date   LUMBAR LAMINECTOMY N/A 07/04/2015   Procedure: Left L5-S1 MICRODISCECTOMY;  Surgeon: Jessy Oto, MD;  Location: [facility];  Service: Orthopedics;  Laterality: N/A;   LUMBAR SPINE SURGERY     UTERINE FIBROID SURGERY     VESICOVAGINAL FISTULA CLOSURE W/ TAH     Social History   Occupational History   Occupation: marketing/sales  Tobacco Use   Smoking status: Never Smoker   Smokeless tobacco: Never Used  Substance and Sexual Activity   Alcohol use: No   Drug use: No   Sexual activity: Not on file

## 2019-06-04 NOTE — Therapy (Signed)
[place], [place], [address] Phone: [phone]   Fax:  [phone]  Physical Therapy Treatment/Discharge  Patient Details  Name: [name] MRN: [id number] Date of Birth: [birth year] Referring Provider (PT): Benjiman Core, PA-C   Encounter Date: 05/02/2019    Past Medical History:  Diagnosis Date  . Anxiety   . Asthma   . Chronic kidney disease   . Complication of anesthesia    was slow to wake up with one of her surgeries  . Cough   . Headache    hx. migraines  . Hyperventilation   . Insomnia   . Pleurisy   . UTI (urinary tract infection)     Past Surgical History:  Procedure Laterality Date  . LUMBAR LAMINECTOMY N/A 07/04/2015   Procedure: Left L5-S1 MICRODISCECTOMY;  Surgeon: Jessy Oto, MD;  Location: [facility];  Service: Orthopedics;  Laterality: N/A;  . LUMBAR SPINE SURGERY    . UTERINE FIBROID SURGERY    . VESICOVAGINAL FISTULA CLOSURE W/ TAH      There were no vitals filed for this visit.                                 PT Long Term Goals - 04/12/19 1314      PT LONG TERM GOAL #1   Title  Independent with HEP    Baseline  needs HEP    Time  6    Period  Weeks    Status  New    Target Date  05/24/19      PT LONG TERM GOAL #2   Title  Improve FOTO outcome measure score to 29% or less impairment    Baseline  53% limited    Time  6    Period  Weeks    Status  New    Target Date  05/24/19      PT LONG TERM GOAL #3   Title  Independent community level ambulation without CAM boot with right foot and ankle pain 3/10 or less    Baseline  ambulates mod I with CAM boot, pain 7/10    Time  6    Period  Weeks    Status  New    Target Date  05/24/19      PT LONG TERM GOAL #4   Title  Increase right ankle dorsiflexion AROM to at least neutral to improve ability for heel strike with gait/improve toe clearance    Baseline  -4 deg    Time  6    Period   Weeks    Status  New    Target Date  05/24/19      PT LONG TERM GOAL #5   Title  Right ankle strength for IV, EV, DF grossly 5/5 to improve ankle stability for ambulation over uneven surfaces    Baseline  4-/5 IV, EV, 4/5 DF    Time  6    Period  Weeks    Status  New    Target Date  05/24/19              Patient will benefit from skilled therapeutic intervention in order to improve the following deficits and impairments:  Pain, Impaired flexibility, Decreased strength, Decreased activity tolerance, Decreased range of motion, Increased edema, Difficulty walking  Visit Diagnosis: Pain in right ankle and joints of right foot  Stiffness of right  ankle, not elsewhere classified  Difficulty in walking, not elsewhere classified     Problem List Patient Active Problem List   Diagnosis Date Noted  . Sinusitis 04/10/2018  . Asthma exacerbation 03/28/2018  . SOB (shortness of breath)   . Pyelonephritis 04/15/2016  . Normochromic normocytic anemia 04/15/2016  . Elevated LFTs 04/15/2016  . HNP (herniated nucleus pulposus), lumbar 07/04/2015    Class: Acute  . Anxiety state 08/08/2013  . INSOMNIA 05/20/2008  . Asthma with bronchitis 05/15/2007  . PLEURISY 05/15/2007  . HYPERVENTILATION 05/15/2007  . COUGH 05/15/2007        PHYSICAL THERAPY DISCHARGE SUMMARY  Visits from Start of Care: 3  Current functional level related to goals / functional outcomes: Patient attended 3 visits and did not return for further therapy.   Remaining deficits: NA-did not return   Education / Equipment: NA Plan:                                                    Patient goals were not met. Patient is being discharged due to not returning since the last visit.  ?????             Beaulah Dinning, PT, DPT 06/04/19 3:26 PM      [facility] [address] [place], [place], [address] Phone: [phone]   Fax:   [phone]  Name: [name] MRN: [id number] Date of Birth: [birth year]

## 2019-06-06 ENCOUNTER — Telehealth: Payer: Self-pay | Admitting: Orthopedic Surgery

## 2019-06-06 NOTE — Telephone Encounter (Signed)
You saw this pt in the office yesterday and advised for her to try prednisone and reduced wtb for ankle pain. Advised that in one month is still having s/s may have to consider a a fusion. Pt wants to know if she can just proceed to surgery and not wait.

## 2019-06-06 NOTE — Telephone Encounter (Signed)
Patient called asked if she can get scheduled for surgery now instead of waiting 4 weeks out. The number to contact patient is [phone]

## 2019-06-07 ENCOUNTER — Telehealth: Payer: Self-pay | Admitting: Orthopedic Surgery

## 2019-06-07 NOTE — Telephone Encounter (Signed)
Please see message below. Pt would like to proceed with outpatient surgery at [facility]. Please see message below and let me know if there is anything that I need to do.

## 2019-06-07 NOTE — Telephone Encounter (Signed)
Patient asking for a note for when she was here this week.  Please call patient to advise. [phone]

## 2019-06-07 NOTE — Telephone Encounter (Signed)
Yes call patient and we will posterior for outpatient surgery at [facility] for talonavicular and subtalar fusion.

## 2019-06-07 NOTE — Telephone Encounter (Signed)
Pt is aware that the letter is ready for pick up at the front desk.

## 2019-06-12 ENCOUNTER — Telehealth: Payer: Self-pay | Admitting: Orthopedic Surgery

## 2019-06-12 NOTE — Telephone Encounter (Signed)
I called pt and advised that we could not do a note for last week she was not advised to remain out of work. Pt voiced understanding. She does want to know when she may be scheduled for her ankle fusion and I advised that surgeries are on hold and being reviewed by the hospital case by case. I advised that I would refer the message to you and see if you might be able to explain what that looks like moving forward.

## 2019-06-12 NOTE — Telephone Encounter (Signed)
Pt called in said she needs a not to excuse her for the entire week of 06/04/2019 not just the day she was seen.   [phone]

## 2019-06-20 ENCOUNTER — Other Ambulatory Visit: Payer: Self-pay

## 2019-06-20 ENCOUNTER — Ambulatory Visit: Payer: Self-pay

## 2019-06-20 ENCOUNTER — Ambulatory Visit: Payer: Commercial Managed Care - PPO | Admitting: Family Medicine

## 2019-06-20 ENCOUNTER — Encounter: Payer: Self-pay | Admitting: Family Medicine

## 2019-06-20 DIAGNOSIS — M25572 Pain in left ankle and joints of left foot: Secondary | ICD-10-CM

## 2019-06-20 NOTE — Progress Notes (Signed)
Office Visit Note   Patient: [name]           Date of Birth: [birth year]           MRN: [id number] Visit Date: 06/20/2019 Requested by: [name], MD [address] [place],  [place] [address] PCP: [name], MD  Subjective: Chief Complaint  Patient presents with  . Left Foot - Pain    Pain in medial ankle/foot and plantar heel x 2 weeks. Is on crutches and wearing a fracture boot on the right foot. Most of her weight is on the left foot.    HPI: She is here with left ankle pain.  Symptoms started 2 weeks ago, no injury.  Pain on the medial side of her ankle with weightbearing.  She has been using crutches, taking the weight off of her right foot which is still bothering her substantially and she is going to schedule surgery in the near future.              ROS: No fevers or chills.  All other systems were reviewed and are negative.  Objective: Vital Signs: There were no vitals taken for this visit.  Physical Exam:  General:  Alert and oriented, in no acute distress. Pulm:  Breathing unlabored. Psy:  Normal mood, congruent affect. Skin: No rash. Left ankle: She is very tender to palpation along the tibialis posterior tendon and she has pain with supination of the foot against resistance.  No pain with flexion of the toes against resistance or dorsiflexion/plantarflexion of the ankle.  Imaging: Limited diagnostic ultrasound: There is slight thickening of the tendon sheath around the tibialis posterior tendon.  No hyperemia on power Doppler imaging.  Assessment & Plan: 1.  Left ankle pain possibly due to tibialis posterior tenosynovitis. -Discussed with patient, she wants an injection today.  Follow-up as needed for this.     Procedures: Left ankle injection: After sterile prep with Betadine, injected 3 cc 1% lidocaine without epinephrine and 40 mg methylprednisolone into the tendon sheath of the tibialis posterior tendon using ultrasound to  guide needle placement.  She had excellent immediate relief.   PMFS History: Patient Active Problem List   Diagnosis Date Noted  . Sinusitis 04/10/2018  . Asthma exacerbation 03/28/2018  . SOB (shortness of breath)   . Pyelonephritis 04/15/2016  . Normochromic normocytic anemia 04/15/2016  . Elevated LFTs 04/15/2016  . HNP (herniated nucleus pulposus), lumbar 07/04/2015    Class: Acute  . Anxiety state 08/08/2013  . INSOMNIA 05/20/2008  . Asthma with bronchitis 05/15/2007  . PLEURISY 05/15/2007  . HYPERVENTILATION 05/15/2007  . COUGH 05/15/2007   Past Medical History:  Diagnosis Date  . Anxiety   . Asthma   . Chronic kidney disease   . Complication of anesthesia    was slow to wake up with one of her surgeries  . Cough   . Headache    hx. migraines  . Hyperventilation   . Insomnia   . Pleurisy   . UTI (urinary tract infection)     Family History  Problem Relation Age of Onset  . Prostate cancer Father   . Allergic rhinitis Mother   . Breast cancer Mother 47  . Angioedema Neg Hx   . Eczema Neg Hx   . Urticaria Neg Hx   . Asthma Neg Hx     Past Surgical History:  Procedure Laterality Date  . LUMBAR LAMINECTOMY N/A 07/04/2015   Procedure: Left L5-S1  MICRODISCECTOMY;  Surgeon: Jessy Oto, MD;  Location: [facility];  Service: Orthopedics;  Laterality: N/A;  . LUMBAR SPINE SURGERY    . UTERINE FIBROID SURGERY    . VESICOVAGINAL FISTULA CLOSURE W/ TAH     Social History   Occupational History  . Occupation: marketing/sales  Tobacco Use  . Smoking status: Never Smoker  . Smokeless tobacco: Never Used  Substance and Sexual Activity  . Alcohol use: No  . Drug use: No  . Sexual activity: Not on file

## 2019-06-27 ENCOUNTER — Other Ambulatory Visit: Payer: Self-pay | Admitting: Physician Assistant

## 2019-07-02 ENCOUNTER — Ambulatory Visit: Payer: Commercial Managed Care - PPO | Admitting: Orthopedic Surgery

## 2019-07-03 ENCOUNTER — Other Ambulatory Visit: Payer: Self-pay

## 2019-07-09 ENCOUNTER — Other Ambulatory Visit (HOSPITAL_COMMUNITY)
Admission: RE | Admit: 2019-07-09 | Discharge: 2019-07-09 | Disposition: A | Discharge status: Home / Self Care | Payer: Managed Care, Other (non HMO) | Source: Ambulatory Visit | Attending: Orthopedic Surgery | Admitting: Orthopedic Surgery

## 2019-07-09 ENCOUNTER — Other Ambulatory Visit: Payer: Self-pay

## 2019-07-09 ENCOUNTER — Encounter (HOSPITAL_COMMUNITY): Payer: Self-pay | Admitting: Orthopedic Surgery

## 2019-07-09 DIAGNOSIS — Z01812 Encounter for preprocedural laboratory examination: Secondary | ICD-10-CM | POA: Insufficient documentation

## 2019-07-09 DIAGNOSIS — Z20822 Contact with and (suspected) exposure to covid-19: Secondary | ICD-10-CM | POA: Insufficient documentation

## 2019-07-09 LAB — SARS CORONAVIRUS 2 (TAT 6-24 HRS): SARS Coronavirus 2: NEGATIVE

## 2019-07-09 NOTE — Progress Notes (Signed)
Patient was given pre-op instructions over the phone. The opportunity was given for the patient to ask questions. No further questions asked. Patient verbalized understanding of instructions given.   Nothing to eat after midnight the night before surgery.  Patient can have clears until 0720am the morning of surgery  7 days prior to surgery STOP taking any Aspirin (unless otherwise instructed by your surgeon), Aleve, Naproxen, Ibuprofen, Motrin, Advil, Goody's, BC's, all herbal medications, fish oil, and all vitamins.   Anesthesia Review Needed: n/a

## 2019-07-11 ENCOUNTER — Ambulatory Visit (HOSPITAL_COMMUNITY)
Admission: RE | Admit: 2019-07-11 | Discharge: 2019-07-11 | Disposition: A | Discharge status: Home / Self Care | Payer: Managed Care, Other (non HMO) | Attending: Orthopedic Surgery | Admitting: Orthopedic Surgery

## 2019-07-11 ENCOUNTER — Ambulatory Visit (HOSPITAL_COMMUNITY): Payer: Managed Care, Other (non HMO) | Admitting: Anesthesiology

## 2019-07-11 ENCOUNTER — Encounter (HOSPITAL_COMMUNITY)
Admission: RE | Disposition: A | Discharge status: Home / Self Care | Payer: Self-pay | Source: Home / Self Care | Attending: Orthopedic Surgery

## 2019-07-11 ENCOUNTER — Encounter (HOSPITAL_COMMUNITY): Payer: Self-pay | Admitting: Orthopedic Surgery

## 2019-07-11 ENCOUNTER — Other Ambulatory Visit: Payer: Self-pay

## 2019-07-11 DIAGNOSIS — J45909 Unspecified asthma, uncomplicated: Secondary | ICD-10-CM | POA: Insufficient documentation

## 2019-07-11 DIAGNOSIS — M76821 Posterior tibial tendinitis, right leg: Secondary | ICD-10-CM | POA: Diagnosis not present

## 2019-07-11 DIAGNOSIS — M67873 Other specified disorders of tendon, right ankle and foot: Secondary | ICD-10-CM | POA: Insufficient documentation

## 2019-07-11 DIAGNOSIS — Z79899 Other long term (current) drug therapy: Secondary | ICD-10-CM | POA: Insufficient documentation

## 2019-07-11 DIAGNOSIS — M898X7 Other specified disorders of bone, ankle and foot: Secondary | ICD-10-CM | POA: Insufficient documentation

## 2019-07-11 HISTORY — PX: ANKLE FUSION: SHX5718

## 2019-07-11 LAB — CBC
HCT: 35.8 % — ABNORMAL LOW (ref 36.0–46.0)
Hemoglobin: 11.7 g/dL — ABNORMAL LOW (ref 12.0–15.0)
MCH: 29 pg (ref 26.0–34.0)
MCHC: 32.7 g/dL (ref 30.0–36.0)
MCV: 88.6 fL (ref 80.0–100.0)
Platelets: 180 10*3/uL (ref 150–400)
RBC: 4.04 MIL/uL (ref 3.87–5.11)
RDW: 12.7 % (ref 11.5–15.5)
WBC: 5.2 10*3/uL (ref 4.0–10.5)
nRBC: 0 % (ref 0.0–0.2)

## 2019-07-11 LAB — BASIC METABOLIC PANEL
Anion gap: 9 (ref 5–15)
BUN: 14 mg/dL (ref 6–20)
CO2: 25 mmol/L (ref 22–32)
Calcium: 9.5 mg/dL (ref 8.9–10.3)
Chloride: 108 mmol/L (ref 98–111)
Creatinine, Ser: 1.18 mg/dL — ABNORMAL HIGH (ref 0.44–1.00)
GFR calc Af Amer: 59 mL/min — ABNORMAL LOW (ref >60)
GFR calc non Af Amer: 51 mL/min — ABNORMAL LOW (ref >60)
Glucose, Bld: 88 mg/dL (ref 70–99)
Potassium: 4.2 mmol/L (ref 3.5–5.1)
Sodium: 142 mmol/L (ref 135–145)

## 2019-07-11 LAB — SURGICAL PCR SCREEN
MRSA, PCR: NEGATIVE
Staphylococcus aureus: NEGATIVE

## 2019-07-11 SURGERY — ANKLE FUSION
Anesthesia: General | Site: Ankle | Laterality: Right

## 2019-07-11 MED ORDER — LACTATED RINGERS IV SOLN: INTRAVENOUS | Status: DC

## 2019-07-11 MED ORDER — DEXAMETHASONE SODIUM PHOSPHATE 10 MG/ML IJ SOLN
INTRAMUSCULAR | Status: DC | PRN
  Administered 2019-07-11: 10 mg via INTRAVENOUS

## 2019-07-11 MED ORDER — LIDOCAINE 2% (20 MG/ML) 5 ML SYRINGE
INTRAMUSCULAR | Status: AC
  Filled 2019-07-11: qty 5

## 2019-07-11 MED ORDER — ONDANSETRON HCL 4 MG/2ML IJ SOLN
4.0000 mg | Freq: Once | INTRAMUSCULAR | Status: AC | PRN
  Administered 2019-07-11: 4 mg via INTRAVENOUS

## 2019-07-11 MED ORDER — 0.9 % SODIUM CHLORIDE (POUR BTL) OPTIME
TOPICAL | Status: DC | PRN
  Administered 2019-07-11: 12:00:00 1000 mL

## 2019-07-11 MED ORDER — MIDAZOLAM HCL 2 MG/2ML IJ SOLN
INTRAMUSCULAR | Status: AC
  Administered 2019-07-11: 2 mg via INTRAVENOUS
  Filled 2019-07-11: qty 2

## 2019-07-11 MED ORDER — PROPOFOL 10 MG/ML IV BOLUS
INTRAVENOUS | Status: DC | PRN
  Administered 2019-07-11: 200 mg via INTRAVENOUS

## 2019-07-11 MED ORDER — PROPOFOL 10 MG/ML IV BOLUS
INTRAVENOUS | Status: AC
  Filled 2019-07-11: qty 20

## 2019-07-11 MED ORDER — FENTANYL CITRATE (PF) 100 MCG/2ML IJ SOLN
INTRAMUSCULAR | Status: DC | PRN
  Administered 2019-07-11 (×2): 50 ug via INTRAVENOUS

## 2019-07-11 MED ORDER — OXYCODONE HCL 5 MG/5ML PO SOLN: 5.0000 mg | Freq: Once | ORAL | Status: AC | PRN

## 2019-07-11 MED ORDER — ONDANSETRON HCL 4 MG/2ML IJ SOLN
INTRAMUSCULAR | Status: AC
  Filled 2019-07-11: qty 6

## 2019-07-11 MED ORDER — MIDAZOLAM HCL 2 MG/2ML IJ SOLN: 2.0000 mg | Freq: Once | INTRAMUSCULAR | Status: AC

## 2019-07-11 MED ORDER — FENTANYL CITRATE (PF) 250 MCG/5ML IJ SOLN
INTRAMUSCULAR | Status: AC
  Filled 2019-07-11: qty 5

## 2019-07-11 MED ORDER — ROPIVACAINE HCL 5 MG/ML IJ SOLN
INTRAMUSCULAR | Status: DC | PRN
  Administered 2019-07-11: 30 mL via PERINEURAL

## 2019-07-11 MED ORDER — HYDROMORPHONE HCL 1 MG/ML IJ SOLN
INTRAMUSCULAR | Status: AC
  Filled 2019-07-11: qty 1

## 2019-07-11 MED ORDER — LIDOCAINE 2% (20 MG/ML) 5 ML SYRINGE
INTRAMUSCULAR | Status: DC | PRN
  Administered 2019-07-11: 60 mg via INTRAVENOUS

## 2019-07-11 MED ORDER — OXYCODONE-ACETAMINOPHEN 5-325 MG PO TABS: 1.0000 | ORAL_TABLET | ORAL | 0 refills | Status: DC | PRN

## 2019-07-11 MED ORDER — FENTANYL CITRATE (PF) 100 MCG/2ML IJ SOLN
INTRAMUSCULAR | Status: AC
  Administered 2019-07-11: 100 ug via INTRAVENOUS
  Filled 2019-07-11: qty 2

## 2019-07-11 MED ORDER — HYDROMORPHONE HCL 1 MG/ML IJ SOLN
0.2500 mg | INTRAMUSCULAR | Status: DC | PRN
  Administered 2019-07-11 (×3): 0.5 mg via INTRAVENOUS

## 2019-07-11 MED ORDER — ASCRIPTIN 325 MG PO TABS: 1.0000 | ORAL_TABLET | Freq: Every day | ORAL | 2 refills | Status: DC

## 2019-07-11 MED ORDER — OXYCODONE HCL 5 MG PO TABS
5.0000 mg | ORAL_TABLET | Freq: Once | ORAL | Status: AC | PRN
  Administered 2019-07-11: 5 mg via ORAL

## 2019-07-11 MED ORDER — CHLORHEXIDINE GLUCONATE 4 % EX LIQD: 60.0000 mL | Freq: Once | CUTANEOUS | Status: DC

## 2019-07-11 MED ORDER — ONDANSETRON HCL 4 MG/2ML IJ SOLN
INTRAMUSCULAR | Status: AC
  Filled 2019-07-11: qty 2

## 2019-07-11 MED ORDER — FENTANYL CITRATE (PF) 100 MCG/2ML IJ SOLN: 100.0000 ug | Freq: Once | INTRAMUSCULAR | Status: AC

## 2019-07-11 MED ORDER — ONDANSETRON HCL 4 MG/2ML IJ SOLN
INTRAMUSCULAR | Status: DC | PRN
  Administered 2019-07-11: 4 mg via INTRAVENOUS

## 2019-07-11 MED ORDER — CEFAZOLIN SODIUM-DEXTROSE 2-4 GM/100ML-% IV SOLN
2.0000 g | INTRAVENOUS | Status: AC
  Administered 2019-07-11: 2 g via INTRAVENOUS
  Filled 2019-07-11: qty 100

## 2019-07-11 MED ORDER — OXYCODONE HCL 5 MG PO TABS
ORAL_TABLET | ORAL | Status: AC
  Filled 2019-07-11: qty 1

## 2019-07-11 MED ORDER — MUPIROCIN 2 % EX OINT
TOPICAL_OINTMENT | CUTANEOUS | Status: AC
  Administered 2019-07-11: 1 via TOPICAL
  Filled 2019-07-11: qty 22

## 2019-07-11 MED ORDER — MUPIROCIN 2 % EX OINT: 1.0000 "application " | TOPICAL_OINTMENT | CUTANEOUS | Status: AC

## 2019-07-11 MED ORDER — DEXAMETHASONE SODIUM PHOSPHATE 10 MG/ML IJ SOLN
INTRAMUSCULAR | Status: AC
  Filled 2019-07-11: qty 2

## 2019-07-11 SURGICAL SUPPLY — 50 items
BANDAGE ESMARK 6X9 LF (GAUZE/BANDAGES/DRESSINGS) ×1 IMPLANT
BIT DRILL PROFILE 7.0 FT (DRILL) ×1 IMPLANT
BLADE AVERAGE 25MMX9MM (BLADE) ×1
BLADE AVERAGE 25X9 (BLADE) ×2 IMPLANT
BLADE SAW SGTL 81X20 HD (BLADE) ×3 IMPLANT
BLADE SAW SGTL MED 73X18.5 STR (BLADE) ×3 IMPLANT
BLADE SURG 10 STRL SS (BLADE) IMPLANT
BNDG COHESIVE 4X5 TAN STRL (GAUZE/BANDAGES/DRESSINGS) ×3 IMPLANT
BNDG ESMARK 6X9 LF (GAUZE/BANDAGES/DRESSINGS) ×3
BNDG GAUZE ELAST 4 BULKY (GAUZE/BANDAGES/DRESSINGS) ×6 IMPLANT
BUR EGG ELITE 4.0 (BURR) ×2 IMPLANT
BUR EGG ELITE 4.0MM (BURR) ×1
COVER MAYO STAND STRL (DRAPES) IMPLANT
COVER SURGICAL LIGHT HANDLE (MISCELLANEOUS) ×6 IMPLANT
COVER WAND RF STERILE (DRAPES) ×3 IMPLANT
DRAPE OEC MINIVIEW 54X84 (DRAPES) IMPLANT
DRAPE U-SHAPE 47X51 STRL (DRAPES) ×3 IMPLANT
DRILL PROFILE 7.0 FT (DRILL) ×3
DRSG ADAPTIC 3X8 NADH LF (GAUZE/BANDAGES/DRESSINGS) ×3 IMPLANT
DRSG EMULSION OIL 3X3 NADH (GAUZE/BANDAGES/DRESSINGS) ×3 IMPLANT
DURAPREP 26ML APPLICATOR (WOUND CARE) ×3 IMPLANT
ELECT REM PT RETURN 9FT ADLT (ELECTROSURGICAL) ×3
ELECTRODE REM PT RTRN 9FT ADLT (ELECTROSURGICAL) ×1 IMPLANT
GAUZE SPONGE 4X4 12PLY STRL (GAUZE/BANDAGES/DRESSINGS) ×3 IMPLANT
GLOVE BIOGEL PI IND STRL 9 (GLOVE) ×1 IMPLANT
GLOVE BIOGEL PI INDICATOR 9 (GLOVE) ×2
GLOVE SURG ORTHO 9.0 STRL STRW (GLOVE) ×3 IMPLANT
GOWN STRL REUS W/ TWL LRG LVL3 (GOWN DISPOSABLE) ×1 IMPLANT
GOWN STRL REUS W/ TWL XL LVL3 (GOWN DISPOSABLE) ×1 IMPLANT
GOWN STRL REUS W/TWL LRG LVL3 (GOWN DISPOSABLE) ×2
GOWN STRL REUS W/TWL XL LVL3 (GOWN DISPOSABLE) ×2
GUIDEWIRE W/TRCR TIP 2.4X9.25 (WIRE) ×3 IMPLANT
KIT BASIN OR (CUSTOM PROCEDURE TRAY) ×3 IMPLANT
KIT TURNOVER KIT B (KITS) ×3 IMPLANT
NS IRRIG 1000ML POUR BTL (IV SOLUTION) ×3 IMPLANT
PACK ORTHO EXTREMITY (CUSTOM PROCEDURE TRAY) ×3 IMPLANT
PAD ABD 8X10 STRL (GAUZE/BANDAGES/DRESSINGS) ×3 IMPLANT
PAD ARMBOARD 7.5X6 YLW CONV (MISCELLANEOUS) ×6 IMPLANT
PUTTY DBM ALLOSYNC PURE 10CC (Putty) ×3 IMPLANT
SCREW COMPR FT 7X65 (Screw) ×3 IMPLANT
SPONGE LAP 18X18 RF (DISPOSABLE) ×3 IMPLANT
STAPLE SUPERMX NITI 20X20 (Staple) ×3 IMPLANT
SUCTION FRAZIER HANDLE 10FR (MISCELLANEOUS) ×2
SUCTION TUBE FRAZIER 10FR DISP (MISCELLANEOUS) ×1 IMPLANT
SUT ETHILON 2 0 PSLX (SUTURE) ×9 IMPLANT
TOWEL GREEN STERILE (TOWEL DISPOSABLE) ×3 IMPLANT
TOWEL GREEN STERILE FF (TOWEL DISPOSABLE) ×3 IMPLANT
TUBE CONNECTING 12'X1/4 (SUCTIONS) ×1
TUBE CONNECTING 12X1/4 (SUCTIONS) ×2 IMPLANT
WATER STERILE IRR 1000ML POUR (IV SOLUTION) ×3 IMPLANT

## 2019-07-11 NOTE — Progress Notes (Signed)
Orthopedic Tech Progress Note Patient Details:  [name] 22-Feb-1961 [id number] PACU RN called requesting a CAM WALKER for patient Ortho Devices Type of Ortho Device: CAM walker Ortho Device/Splint Location: RLE Ortho Device/Splint Interventions: Application, Ordered   Post Interventions Patient Tolerated: Well Instructions Provided: Care of device, Adjustment of device   [name] 07/11/2019, 1:41 PM

## 2019-07-11 NOTE — Anesthesia Postprocedure Evaluation (Signed)
Anesthesia Post Note  Patient: Tera Helper  Procedure(s) Performed: RIGHT TALONAVICULAR AND SUBTALAR FUSION (Right Ankle)     Patient location during evaluation: PACU Anesthesia Type: General Level of consciousness: awake and alert Pain management: pain level controlled Vital Signs Assessment: post-procedure vital signs reviewed and stable Respiratory status: spontaneous breathing, nonlabored ventilation and respiratory function stable Cardiovascular status: blood pressure returned to baseline and stable Postop Assessment: no apparent nausea or vomiting Anesthetic complications: no    Last Vitals:  Vitals:   07/11/19 1359 07/11/19 1415  BP: 132/87 119/64  Pulse: (!) 59 (!) 56  Resp: 10 (!) 9  Temp:    SpO2: 99% 97%    Last Pain:  Vitals:   07/11/19 1415  PainSc: 4                  Lowella Curb

## 2019-07-11 NOTE — H&P (Signed)
[name] is an 59 y.o. female.   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  Patient did have an elevated ANA and rheumatology stated that she does not have a rheumatologic disease.  The uric acid was 5.8 and she had no response or improvement with using allopurinol or colchicine.  Patient complains of persistent pain and at this time its only over the posterior tibial tendon  Past Medical History:  Diagnosis Date  . Anxiety   . Asthma   . Chronic kidney disease    patient denies - 07/09/2019  . Complication of anesthesia    was slow to wake up with one of her surgeries  . Cough   . Headache    hx. migraines  . Hyperventilation   . Insomnia   . Pleurisy   . UTI (urinary tract infection)     Past Surgical History:  Procedure Laterality Date  . ABDOMINAL HYSTERECTOMY    . LUMBAR LAMINECTOMY N/A 07/04/2015   Procedure: Left L5-S1 MICRODISCECTOMY;  Surgeon: Jessy Oto, MD;  Location: [facility];  Service: Orthopedics;  Laterality: N/A;  . LUMBAR SPINE SURGERY    . UTERINE FIBROID SURGERY    . VESICOVAGINAL FISTULA CLOSURE W/ TAH      Family History  Problem Relation Age of Onset  . Prostate cancer Father   . Allergic rhinitis Mother   . Breast cancer Mother 38  . Angioedema Neg Hx   . Eczema Neg Hx   . Urticaria Neg Hx   . Asthma Neg Hx    Social History:  reports that she has never smoked. She has never used smokeless tobacco. She reports that she does not drink alcohol or use drugs.  Allergies:  Allergies  Allergen Reactions  . Doxycycline Anaphylaxis  . Metoclopramide Hcl Shortness Of Breath and Swelling  . Prochlorperazine Edisylate Anaphylaxis  . Erythromycin Base   . Macrobid  [Nitrofurantoin Macrocrystal]     No medications prior to admission.    Results for orders placed or  performed during the hospital encounter of 07/09/19 (from the past 48 hour(s))  SARS CORONAVIRUS 2 (TAT 6-24 HRS) Nasopharyngeal Nasopharyngeal Swab     Status: None   Collection Time: 07/09/19 12:40 PM   Specimen: Nasopharyngeal Swab  Result Value Ref Range   SARS Coronavirus 2 NEGATIVE NEGATIVE    Comment: (NOTE) SARS-CoV-2 target nucleic acids are NOT DETECTED. The SARS-CoV-2 RNA is generally detectable in upper and lower respiratory specimens during the acute phase of infection. Negative results do not preclude SARS-CoV-2 infection, do not rule out co-infections with other pathogens, and should not be used as the sole basis for treatment or other patient management decisions. Negative results must be combined with clinical observations, patient history, and epidemiological information. The expected result is Negative. Fact Sheet for Patients: SugarRoll.be Fact Sheet for Healthcare Providers: https://www.woods-mathews.com/ This test is not yet approved or cleared by the [place] FDA and  has been authorized for detection and/or diagnosis of SARS-CoV-2 by FDA under an Emergency Use Authorization (EUA). This EUA will remain  in effect (meaning this test can be used) for the duration of the COVID-19 declaration under Section 56 4(b)(1) of the Act, 21 U.S.C. section 360bbb-3(b)(1), unless the authorization is terminated or revoked sooner. Performed at [facility], [place],  [place] [address]    No results found.  Review of Systems  All other systems reviewed and are negative.   There were no vitals taken for this visit. Physical Exam  Patient is alert, oriented, no adenopathy, well-dressed, normal affect, normal respiratory effort. Examination patient has a strong dorsalis pedis pulse she has only tender to palpation at this time over the posterior tibial tendon.  The MRI scan is reviewed which shows fluid  around the posterior tibial tendon as well as some swelling locally in the soft tissue.  Clinically she has no purulence no abscess no redness no cellulitis no signs of any abscess.  Clinically.  Patient cannot do a single limb heel raise and attempted single limb heel raise on the right reproduces the symptoms that she is feeling. Assessment/Plan 1. Posterior tibial tendinitis, right leg     Plan: We will start on a low-dose prednisone 20 mg with breakfast and wean off as she feels comfortable she is to use the fracture boot and crutches.  At follow-up if she is still symptomatic discussed that we may need to consider tibiotalar and subtalar fusion.   [name] Milicent Acheampong, PA 07/11/2019, 6:52 AM

## 2019-07-11 NOTE — Transfer of Care (Signed)
Immediate Anesthesia Transfer of Care Note  Patient: [name]  Procedure(s) Performed: RIGHT TALONAVICULAR AND SUBTALAR FUSION (Right Ankle)  Patient Location: PACU  Anesthesia Type:GA combined with regional for post-op pain  Level of Consciousness: awake and patient cooperative  Airway & Oxygen Therapy: Patient Spontanous Breathing  Post-op Assessment: Report given to RN and Post -op Vital signs reviewed and stable  Post vital signs: Reviewed and stable  Last Vitals:  Vitals Value Taken Time  BP    Temp    Pulse    Resp    SpO2      Last Pain:  Vitals:   07/11/19 1045  PainSc: 5       Patients Stated Pain Goal: 5 (07/11/19 1035)  Complications: No apparent anesthesia complications

## 2019-07-11 NOTE — Anesthesia Procedure Notes (Signed)
Anesthesia Regional Block: Popliteal block   Pre-Anesthetic Checklist: ,, timeout performed, Correct Patient, Correct Site, Correct Laterality, Correct Procedure, Correct Position, site marked, Risks and benefits discussed,  Surgical consent,  Pre-op evaluation,  At surgeon's request and post-op pain management  Laterality: Right  Prep: chloraprep       Needles:  Injection technique: Single-shot  Needle Type: Stimiplex     Needle Length: 9cm  Needle Gauge: 21     Additional Needles:   Procedures:,,,, ultrasound used (permanent image in chart),,,,  Narrative:  Start time: 07/11/2019 10:46 AM End time: 07/11/2019 10:51 AM Injection made incrementally with aspirations every 5 mL.  Performed by: Personally  Anesthesiologist: Lowella Curb, MD

## 2019-07-11 NOTE — Op Note (Signed)
07/11/2019  1:10 PM  PATIENT:  [name]    PRE-OPERATIVE DIAGNOSIS:  Posterior Tibial Tendon Insufficiency  POST-OPERATIVE DIAGNOSIS:  Same  PROCEDURE:  RIGHT TALONAVICULAR AND SUBTALAR FUSION C-arm fluoroscopy verified alignment  SURGEON:  Nadara Mustard, MD  PHYSICIAN ASSISTANT:None ANESTHESIA:   General  PREOPERATIVE INDICATIONS:  [name] is a  59 y.o. female with a diagnosis of Posterior Tibial Tendon Insufficiency who failed conservative measures and elected for surgical management.    The risks benefits and alternatives were discussed with the patient preoperatively including but not limited to the risks of infection, bleeding, nerve injury, cardiopulmonary complications, the need for revision surgery, among others, and the patient was willing to proceed.  OPERATIVE IMPLANTS: Arthrex staple and conical screw Demineralized bone matrix and putty 10 cc  @ENCIMAGES @  OPERATIVE FINDINGS: Sclerosis of the talonavicular and subtalar joint posterior facet  OPERATIVE PROCEDURE: Patient was brought the operating room and underwent a general anesthetic.  After adequate levels anesthesia were obtained patient's right lower extremity was prepped using DuraPrep draped into a sterile field a timeout was called.  An oblique incision was made over the sinus Tarsi.  This was carried down to the subtalar joint the peroneal tendons were protected.  Using a saw and bur the articular cartilage was debrided from the posterior facet back to bleeding viable subchondral bone the bone was irrigated with normal saline.  Attention was then focused on the talonavicular joint.  A dorsal incision was made between the EHL and anterior tibial tendon.  This was carried down to the talonavicular joint the tendons were protected and a bur and saw were used to decorticate the talonavicular joint.  The joints were irrigated and packed open with demineralized bone matrix and putty total of 10 cc.  A staple was  then used to secure the talonavicular joint C-arm fluoroscopy verified alignment.  A conical guidewire was then used for the stabilization of the posterior facet see arthroscopy verified alignment and a 65 mm screw was used to stabilize the subtalar joint.  C-arm fluoroscopy verified alignment of both AP and lateral planes.  The incisions were closed using 2-0 nylon sterile dressing was applied patient was extubated taken the PACU in stable condition.   DISCHARGE PLANNING:  Antibiotic duration: Preoperative antibiotics  Weightbearing: Nonweightbearing postoperatively  Pain medication: Prescription for Percocet  Dressing care/ Wound VAC: Follow-up in 1 week to change the dressing  Ambulatory devices: Walker or crutches  Discharge to: Home.  Follow-up: In the office 1 week post operative.

## 2019-07-11 NOTE — Anesthesia Procedure Notes (Signed)
Procedure Name: Intubation Date/Time: 07/11/2019 12:24 PM Performed by: Rosiland Oz, CRNA Pre-anesthesia Checklist: Patient identified, Emergency Drugs available, Suction available, Patient being monitored and Timeout performed Patient Re-evaluated:Patient Re-evaluated prior to induction Oxygen Delivery Method: Circle system utilized Preoxygenation: Pre-oxygenation with 100% oxygen Induction Type: IV induction LMA: LMA inserted LMA Size: 3.0 Number of attempts: 1 Placement Confirmation: positive ETCO2 and breath sounds checked- equal and bilateral Tube secured with: Tape Dental Injury: Teeth and Oropharynx as per pre-operative assessment

## 2019-07-11 NOTE — Anesthesia Preprocedure Evaluation (Signed)
Anesthesia Evaluation  Patient identified by MRN, date of birth, ID band Patient awake    Reviewed: Allergy & Precautions, NPO status , Patient's Chart, lab work & pertinent test results  Airway Mallampati: II  TM Distance: >3 FB Neck ROM: Full    Dental  (+) Dental Advisory Given   Pulmonary asthma ,    breath sounds clear to auscultation       Cardiovascular negative cardio ROS   Rhythm:Regular Rate:Normal     Neuro/Psych  Headaches, Anxiety    GI/Hepatic negative GI ROS, Neg liver ROS,   Endo/Other  negative endocrine ROS  Renal/GU Renal InsufficiencyRenal disease     Musculoskeletal   Abdominal   Peds  Hematology negative hematology ROS (+)   Anesthesia Other Findings   Reproductive/Obstetrics                             Lab Results  Component Value Date   WBC 5.2 07/11/2019   HGB 11.7 (L) 07/11/2019   HCT 35.8 (L) 07/11/2019   MCV 88.6 07/11/2019   PLT 180 07/11/2019   Lab Results  Component Value Date   CREATININE 1.18 (H) 07/11/2019   BUN 14 07/11/2019   NA 142 07/11/2019   K 4.2 07/11/2019   CL 108 07/11/2019   CO2 25 07/11/2019    Anesthesia Physical  Anesthesia Plan  ASA: II  Anesthesia Plan: General   Post-op Pain Management:  Regional for Post-op pain   Induction: Intravenous  PONV Risk Score and Plan: 3 and Ondansetron, Dexamethasone, Midazolam and Treatment may vary due to age or medical condition  Airway Management Planned: LMA  Additional Equipment:   Intra-op Plan:   Post-operative Plan: Extubation in OR  Informed Consent: I have reviewed the patients History and Physical, chart, labs and discussed the procedure including the risks, benefits and alternatives for the proposed anesthesia with the patient or authorized representative who has indicated his/her understanding and acceptance.     Dental advisory given  Plan Discussed with:  CRNA  Anesthesia Plan Comments:         Anesthesia Quick Evaluation

## 2019-07-12 ENCOUNTER — Encounter: Payer: Self-pay | Admitting: *Deleted

## 2019-07-16 ENCOUNTER — Telehealth: Payer: Self-pay | Admitting: Family

## 2019-07-16 NOTE — Telephone Encounter (Signed)
Let three fingers have been numb since surgery, Has a concern. Trigger and middle/thumb. Can't grasp or pick up anything.  [phone]

## 2019-07-18 ENCOUNTER — Encounter: Payer: Self-pay | Admitting: Family

## 2019-07-18 ENCOUNTER — Telehealth: Payer: Self-pay | Admitting: Orthopedic Surgery

## 2019-07-18 ENCOUNTER — Other Ambulatory Visit: Payer: Self-pay

## 2019-07-18 ENCOUNTER — Ambulatory Visit (INDEPENDENT_AMBULATORY_CARE_PROVIDER_SITE_OTHER): Payer: Managed Care, Other (non HMO) | Admitting: Family

## 2019-07-18 VITALS — Ht 65.0 in | Wt 170.0 lb

## 2019-07-18 DIAGNOSIS — R202 Paresthesia of skin: Secondary | ICD-10-CM

## 2019-07-18 DIAGNOSIS — Z981 Arthrodesis status: Secondary | ICD-10-CM | POA: Insufficient documentation

## 2019-07-18 DIAGNOSIS — R200 Anesthesia of skin: Secondary | ICD-10-CM

## 2019-07-18 DIAGNOSIS — M76821 Posterior tibial tendinitis, right leg: Secondary | ICD-10-CM

## 2019-07-18 MED ORDER — NAPROXEN 500 MG PO TABS: 500.0000 mg | ORAL_TABLET | Freq: Two times a day (BID) | ORAL | 0 refills | Status: DC | PRN

## 2019-07-18 MED ORDER — OXYCODONE-ACETAMINOPHEN 5-325 MG PO TABS: 1.0000 | ORAL_TABLET | ORAL | 0 refills | Status: DC | PRN

## 2019-07-18 NOTE — Telephone Encounter (Signed)
Erskine Squibb called back in and I advised of message below.

## 2019-07-18 NOTE — Progress Notes (Signed)
Office Visit Note   Patient: [name]           Date of Birth: Aug 14, 1960           MRN: [id number] Visit Date: 07/18/2019              Requested by: Newton Pigg, MD [address] [place],  [place] [address] PCP: Newton Pigg, MD  Chief Complaint  Patient presents with  . Right Ankle - Routine Post Op    07/11/2019 right ankle talonavicular & subtalar fusion      HPI: The patient is a 59 year old woman seen today status post right subtalar and talonavicular fusion.  She is 1 week out.  Wound VAC removed today.  She does have some complaints of aching pain states if she does not take her Percocet consistently the pain will get away from her.  Unfortunately today she is primarily complaining of some pain to her left wrist.  She states since discharge from the hospital she has noticed numbness of her thumb and index and long finger.  She reports dropping things weakness in these fingers tingling and pain.  She is right-hand dominant.  She does work with a keyboard but does not feel she has had any injuries or reason to have carpal tunnel.  Denies any neck pain no new injuries  Assessment & Plan: Visit Diagnoses:  1. Posterior tibial tendinitis, right leg   2. H/O ankle fusion     Plan: She will trial naproxen for the next week for carpal tunnel on left. she will follow-up in 1 week with radiographs of her right ankle continue nonweightbearing.  Begin daily Dial soap cleansing dry dressing changes of the right ankle incisions  Follow-Up Instructions: Return in about 2 weeks (around 08/01/2019).   Back Exam   Tenderness  The patient is experiencing no tenderness.   Range of Motion  The patient has normal back ROM.  Comments:  Neg spurling   Right Hand Exam   Muscle Strength  Grip: 5/5    Left Hand Exam   Tenderness  The patient is experiencing no tenderness.   Range of Motion  The patient has normal left wrist ROM.  Muscle Strength   Grip:  3/5   Tests  Phalen's Sign: negative Tinel's sign (median nerve): positive  Other  Erythema: absent Sensation: normal Pulse: present      Patient is alert, oriented, no adenopathy, well-dressed, normal affect, normal respiratory effort. Incisions are well approximated with sutures. Mild to moderate swelling. No drainage. No erythema no sign of infection.  Imaging: No results found. No images are attached to the encounter.  Labs: Lab Results  Component Value Date   HGBA1C  05/14/2008    5.6 (NOTE)   The ADA recommends the following therapeutic goal for glycemic   control related to Hgb A1C measurement:   Goal of Therapy:   < 7.0% Hgb A1C   Reference: American Diabetes Association: Clinical Practice   Recommendations 2008, Diabetes Care,  2008, 31:(Suppl 1).   ESRSEDRATE 11 03/15/2019   LABURIC 6.4 03/15/2019   REPTSTATUS 04/20/2016 FINAL 04/15/2016   REPTSTATUS 04/20/2016 FINAL 04/15/2016   CULT  04/15/2016    NO GROWTH 5 DAYS Performed at [facility]  04/15/2016    NO GROWTH 5 DAYS Performed at [facility] (A) 04/09/2016     Lab Results  Component Value Date  ALBUMIN 3.2 (L) 04/18/2016   ALBUMIN 3.2 (L) 04/16/2016   ALBUMIN 3.3 (L) 04/15/2016   LABURIC 6.4 03/15/2019    Lab Results  Component Value Date   MG 1.9 04/18/2016   MG 2.0 04/16/2016   MG 2.1 05/16/2008   No results found for: VD25OH  No results found for: PREALBUMIN CBC EXTENDED Latest Ref Rng & Units 07/11/2019 03/15/2019 04/10/2018  WBC 4.0 - 10.5 K/uL 5.2 4.3 6.4  RBC 3.87 - 5.11 MIL/uL 4.04 4.19 4.30  HGB 12.0 - 15.0 g/dL 11.7(L) 11.9 12.5  HCT 36.0 - 46.0 % 35.8(L) 36.0 37.2  PLT 150 - 400 K/uL 180 232 188.0  NEUTROABS 1.4 - 7.7 K/uL - - 4.2  LYMPHSABS 0.7 - 4.0 K/uL - - 1.9     Body mass index is 28.29 kg/m.  Orders:  No orders of the defined types were placed in this encounter.  No orders of the defined types were  placed in this encounter.    Procedures: No procedures performed  Clinical Data: No additional findings.  ROS:  All other systems negative, except as noted in the HPI. Review of Systems  Objective: Vital Signs: Ht 5\' 5"  (1.651 m)   Wt 170 lb (77.1 kg)   BMI 28.29 kg/m   Specialty Comments:  No specialty comments available.  PMFS History: Patient Active Problem List   Diagnosis Date Noted  . H/O ankle fusion 07/18/2019  . Posterior tibial tendinitis, right leg   . Sinusitis 04/10/2018  . Asthma exacerbation 03/28/2018  . SOB (shortness of breath)   . Pyelonephritis 04/15/2016  . Normochromic normocytic anemia 04/15/2016  . Elevated LFTs 04/15/2016  . HNP (herniated nucleus pulposus), lumbar 07/04/2015    Class: Acute  . Anxiety state 08/08/2013  . INSOMNIA 05/20/2008  . Asthma with bronchitis 05/15/2007  . PLEURISY 05/15/2007  . HYPERVENTILATION 05/15/2007  . COUGH 05/15/2007   Past Medical History:  Diagnosis Date  . Anxiety   . Asthma   . Chronic kidney disease    patient denies - 07/09/2019  . Complication of anesthesia    was slow to wake up with one of her surgeries  . Cough   . Headache    hx. migraines  . Hyperventilation   . Insomnia   . Pleurisy   . UTI (urinary tract infection)     Family History  Problem Relation Age of Onset  . Prostate cancer Father   . Allergic rhinitis Mother   . Breast cancer Mother 63  . Angioedema Neg Hx   . Eczema Neg Hx   . Urticaria Neg Hx   . Asthma Neg Hx     Past Surgical History:  Procedure Laterality Date  . ABDOMINAL HYSTERECTOMY    . ANKLE FUSION Right 07/11/2019   Procedure: RIGHT TALONAVICULAR AND SUBTALAR FUSION;  Surgeon: 07/13/2019, MD;  Location: [facility] OR;  Service: Orthopedics;  Laterality: Right;  . LUMBAR LAMINECTOMY N/A 07/04/2015   Procedure: Left L5-S1 MICRODISCECTOMY;  Surgeon: 09/01/2015, MD;  Location: [facility] OR;  Service: Orthopedics;  Laterality: N/A;  . LUMBAR SPINE SURGERY    .  UTERINE FIBROID SURGERY    . VESICOVAGINAL FISTULA CLOSURE W/ TAH     Social History   Occupational History  . Occupation: marketing/sales  Tobacco Use  . Smoking status: Never Smoker  . Smokeless tobacco: Never Used  Substance and Sexual Activity  . Alcohol use: No  . Drug use: No  . Sexual activity: Not on  file

## 2019-07-18 NOTE — Telephone Encounter (Signed)
[name] with [employer] short term disability called in requesting a call back from someone to verify pt diagnosis, surgical procedure, appts dates for after surgery, etc. To complete the pt's disability claim.  [phone]

## 2019-07-18 NOTE — Telephone Encounter (Signed)
I called and left voicemail for Erskine Squibb to call our office back to discuss patient's disability. If we can get a form and fax number for the patient's claim we can be able to assist patient with her disability. Lvm for Erskine Squibb to send [place] a form.

## 2019-07-18 NOTE — Addendum Note (Signed)
Addended by: Adonis Huguenin on: 07/18/2019 02:32 PM   Modules accepted: Orders

## 2019-07-26 ENCOUNTER — Ambulatory Visit: Payer: Managed Care, Other (non HMO) | Admitting: Orthopedic Surgery

## 2019-08-02 ENCOUNTER — Ambulatory Visit (INDEPENDENT_AMBULATORY_CARE_PROVIDER_SITE_OTHER): Payer: Managed Care, Other (non HMO) | Admitting: Orthopedic Surgery

## 2019-08-02 ENCOUNTER — Other Ambulatory Visit: Payer: Self-pay

## 2019-08-02 ENCOUNTER — Encounter: Payer: Self-pay | Admitting: Orthopedic Surgery

## 2019-08-02 ENCOUNTER — Ambulatory Visit (INDEPENDENT_AMBULATORY_CARE_PROVIDER_SITE_OTHER): Payer: Managed Care, Other (non HMO)

## 2019-08-02 VITALS — Ht 65.0 in | Wt 170.0 lb

## 2019-08-02 DIAGNOSIS — M25571 Pain in right ankle and joints of right foot: Secondary | ICD-10-CM

## 2019-08-03 ENCOUNTER — Telehealth: Payer: Self-pay | Admitting: Physician Assistant

## 2019-08-03 ENCOUNTER — Encounter: Payer: Self-pay | Admitting: Orthopedic Surgery

## 2019-08-03 ENCOUNTER — Other Ambulatory Visit: Payer: Self-pay | Admitting: Physician Assistant

## 2019-08-03 MED ORDER — OXYCODONE-ACETAMINOPHEN 5-325 MG PO TABS: 1.0000 | ORAL_TABLET | ORAL | 0 refills | Status: DC | PRN

## 2019-08-03 NOTE — Telephone Encounter (Signed)
Patient called t request a refill on Oxycodone.  Please call patient at [phone]

## 2019-08-03 NOTE — Telephone Encounter (Signed)
Pt is s/p a subtalar and talonavicular  fusion on 07/11/19. Requesting refill on oxycodone last fill date was 07/18/19 #42 please advise.

## 2019-08-03 NOTE — Progress Notes (Signed)
Office Visit Note   Patient: [name]           Date of Birth: 07-22-1960           MRN: [id number] Visit Date: 08/02/2019              Requested by: Newton Pigg, MD [address] [place],  [place] [id number] PCP: Newton Pigg, MD  Chief Complaint  Patient presents with  . Right Ankle - Routine Post Op    07/11/19 talonavicular and subtalar fusion       HPI: This is a pleasant 59 year old woman who presents in follow-up today.  She is status post right subtalar and talonavicular arthrodesis 3 weeks ago.  She is doing well.  She has been compliant with her nonweightbearing restrictions.  Her only complaint is of some tingling in her first second and third digits on her left hand since her surgery..  She complained about this at her last visit as well.  She does say it has improved.  She is right-hand dominant  Assessment & Plan: Visit Diagnoses:  1. Pain in right ankle and joints of right foot     Plan:  We will continue to monitor paresthesias in her left hand findings consistent with some carpal tunnel syndrome.  They have improved and I have told her that I would hopefully this continue to do so.  We will reevaluate it at her next visit.  Follow-Up Instructions: No follow-ups on file.   Ortho Exam  Patient is alert, oriented, no adenopathy, well-dressed, normal affect, normal respiratory effort. Focused examination of her left hand demonstrates strong radial and ulnar pulses.  She has some altered sensation in the thumb index and middle fingers.  She has brisk capillary refill.  Good strength with abduction abduction flexion and extension. Imaging: No results found. No images are attached to the encounter.  Labs: Lab Results  Component Value Date   HGBA1C  05/14/2008    5.6 (NOTE)   The ADA recommends the following therapeutic goal for glycemic   control related to Hgb A1C measurement:   Goal of Therapy:   < 7.0% Hgb A1C   Reference: American  Diabetes Association: Clinical Practice   Recommendations 2008, Diabetes Care,  2008, 31:(Suppl 1).   ESRSEDRATE 11 03/15/2019   LABURIC 6.4 03/15/2019   REPTSTATUS 04/20/2016 FINAL 04/15/2016   REPTSTATUS 04/20/2016 FINAL 04/15/2016   CULT  04/15/2016    NO GROWTH 5 DAYS Performed at [facility]  04/15/2016    NO GROWTH 5 DAYS Performed at [facility] (A) 04/09/2016     Lab Results  Component Value Date   ALBUMIN 3.2 (L) 04/18/2016   ALBUMIN 3.2 (L) 04/16/2016   ALBUMIN 3.3 (L) 04/15/2016   LABURIC 6.4 03/15/2019    Lab Results  Component Value Date   MG 1.9 04/18/2016   MG 2.0 04/16/2016   MG 2.1 05/16/2008   No results found for: VD25OH  No results found for: PREALBUMIN CBC EXTENDED Latest Ref Rng & Units 07/11/2019 03/15/2019 04/10/2018  WBC 4.0 - 10.5 K/uL 5.2 4.3 6.4  RBC 3.87 - 5.11 MIL/uL 4.04 4.19 4.30  HGB 12.0 - 15.0 g/dL 11.7(L) 11.9 12.5  HCT 36.0 - 46.0 % 35.8(L) 36.0 37.2  PLT 150 - 400 K/uL 180 232 188.0  NEUTROABS 1.4 - 7.7 K/uL - - 4.2  LYMPHSABS 0.7 - 4.0 K/uL - -  1.9     Body mass index is 28.29 kg/m.  Orders:  Orders Placed This Encounter  Procedures  . XR Ankle 2 Views Right   No orders of the defined types were placed in this encounter.    Procedures: No procedures performed  Clinical Data: No additional findings.  ROS:  All other systems negative, except as noted in the HPI. Review of Systems  Objective: Vital Signs: Ht 5\' 5"  (1.651 m)   Wt 170 lb (77.1 kg)   BMI 28.29 kg/m   Specialty Comments:  No specialty comments available.  PMFS History: Patient Active Problem List   Diagnosis Date Noted  . H/O ankle fusion 07/18/2019  . Posterior tibial tendinitis, right leg   . Sinusitis 04/10/2018  . Asthma exacerbation 03/28/2018  . SOB (shortness of breath)   . Pyelonephritis 04/15/2016  . Normochromic normocytic anemia 04/15/2016  . Elevated LFTs 04/15/2016  .  HNP (herniated nucleus pulposus), lumbar 07/04/2015    Class: Acute  . Anxiety state 08/08/2013  . INSOMNIA 05/20/2008  . Asthma with bronchitis 05/15/2007  . PLEURISY 05/15/2007  . HYPERVENTILATION 05/15/2007  . COUGH 05/15/2007   Past Medical History:  Diagnosis Date  . Anxiety   . Asthma   . Chronic kidney disease    patient denies - 07/09/2019  . Complication of anesthesia    was slow to wake up with one of her surgeries  . Cough   . Headache    hx. migraines  . Hyperventilation   . Insomnia   . Pleurisy   . UTI (urinary tract infection)     Family History  Problem Relation Age of Onset  . Prostate cancer Father   . Allergic rhinitis Mother   . Breast cancer Mother 60  . Angioedema Neg Hx   . Eczema Neg Hx   . Urticaria Neg Hx   . Asthma Neg Hx     Past Surgical History:  Procedure Laterality Date  . ABDOMINAL HYSTERECTOMY    . ANKLE FUSION Right 07/11/2019   Procedure: RIGHT TALONAVICULAR AND SUBTALAR FUSION;  Surgeon: 07/13/2019, MD;  Location: [facility] OR;  Service: Orthopedics;  Laterality: Right;  . LUMBAR LAMINECTOMY N/A 07/04/2015   Procedure: Left L5-S1 MICRODISCECTOMY;  Surgeon: 09/01/2015, MD;  Location: [facility] OR;  Service: Orthopedics;  Laterality: N/A;  . LUMBAR SPINE SURGERY    . UTERINE FIBROID SURGERY    . VESICOVAGINAL FISTULA CLOSURE W/ TAH     Social History   Occupational History  . Occupation: marketing/sales  Tobacco Use  . Smoking status: Never Smoker  . Smokeless tobacco: Never Used  Substance and Sexual Activity  . Alcohol use: No  . Drug use: No  . Sexual activity: Not on file

## 2019-08-03 NOTE — Telephone Encounter (Signed)
Refill called in. 

## 2019-08-09 ENCOUNTER — Other Ambulatory Visit: Payer: Self-pay | Admitting: Family

## 2019-08-10 NOTE — Telephone Encounter (Signed)
Ok to refill 07/11/19 talonavicular and subtalar fusion

## 2019-08-16 ENCOUNTER — Ambulatory Visit: Payer: Managed Care, Other (non HMO) | Admitting: Orthopedic Surgery

## 2019-08-24 ENCOUNTER — Ambulatory Visit (INDEPENDENT_AMBULATORY_CARE_PROVIDER_SITE_OTHER): Payer: Managed Care, Other (non HMO) | Admitting: Physician Assistant

## 2019-08-24 ENCOUNTER — Ambulatory Visit (INDEPENDENT_AMBULATORY_CARE_PROVIDER_SITE_OTHER): Payer: Managed Care, Other (non HMO)

## 2019-08-24 ENCOUNTER — Other Ambulatory Visit: Payer: Self-pay

## 2019-08-24 ENCOUNTER — Encounter: Payer: Self-pay | Admitting: Physician Assistant

## 2019-08-24 VITALS — Ht 65.0 in | Wt 170.0 lb

## 2019-08-24 DIAGNOSIS — M79671 Pain in right foot: Secondary | ICD-10-CM | POA: Diagnosis not present

## 2019-08-24 MED ORDER — GABAPENTIN 300 MG PO CAPS: 300.0000 mg | ORAL_CAPSULE | Freq: Three times a day (TID) | ORAL | 1 refills | Status: DC

## 2019-08-24 MED ORDER — OXYCODONE-ACETAMINOPHEN 5-325 MG PO TABS: 1.0000 | ORAL_TABLET | ORAL | 0 refills | Status: DC | PRN

## 2019-08-24 NOTE — Progress Notes (Signed)
Office Visit Note   Patient: [name]           Date of Birth: Aug 31, 1960           MRN: [id number] Visit Date: 08/24/2019              Requested by: [name], MD [address] [place],  [place] [address] PCP: [name], MD  Chief Complaint  Patient presents with  . Right Ankle - Routine Post Op    07/11/19 right talonavicular and subtalar fusion       HPI: This is a pleasant 60 year old woman who is now 6 weeks status post right subtalar and talonavicular arthrodesis she has tried putting some weight on her foot in her cam walker boot.  She states she is having a lot of pain in the foot.  She denies any calf pain and she notices it mostly when her foot is in a dependent position but more recently she woke up during the night with a sharp pain in the foot she denies any injuries or change in activities  Assessment & Plan: Visit Diagnoses:  1. Right foot pain     Plan: I will refill her pain medication.  I am also going to start her on some gabapentin to see if this can help her with some of what I believe neuropathic component of her pain.  She may ambulate as tolerated in her cam walker boot.  I encouraged her to come out of the boot and work on some gentle ankle range of motion on her own.  She will follow-up in 4 weeks.  X-rays of her foot should be obtained  Follow-Up Instructions: No follow-ups on file.   Ortho Exam  Patient is alert, oriented, no adenopathy, well-dressed, normal affect, normal respiratory effort. She has a strong palpable pulse in the right foot no soft tissue swelling no erythema or cellulitis skin is in good condition.  Her calves are soft and nontender.  She is globally tender to touch throughout the dorsum of her foot  Imaging: No results found. No images are attached to the encounter.  Labs: Lab Results  Component Value Date   HGBA1C  05/14/2008    5.6 (NOTE)   The ADA recommends the following therapeutic goal  for glycemic   control related to Hgb A1C measurement:   Goal of Therapy:   < 7.0% Hgb A1C   Reference: American Diabetes Association: Clinical Practice   Recommendations 2008, Diabetes Care,  2008, 31:(Suppl 1).   ESRSEDRATE 11 03/15/2019   LABURIC 6.4 03/15/2019   REPTSTATUS 04/20/2016 FINAL 04/15/2016   REPTSTATUS 04/20/2016 FINAL 04/15/2016   CULT  04/15/2016    NO GROWTH 5 DAYS Performed at [facility]    CULT  04/15/2016    NO GROWTH 5 DAYS Performed at [facility]    [facility] ESCHERICHIA COLI (A) 04/09/2016     Lab Results  Component Value Date   ALBUMIN 3.2 (L) 04/18/2016   ALBUMIN 3.2 (L) 04/16/2016   ALBUMIN 3.3 (L) 04/15/2016   LABURIC 6.4 03/15/2019    Lab Results  Component Value Date   MG 1.9 04/18/2016   MG 2.0 04/16/2016   MG 2.1 05/16/2008   No results found for: VD25OH  No results found for: PREALBUMIN CBC EXTENDED Latest Ref Rng & Units 07/11/2019 03/15/2019 04/10/2018  WBC 4.0 - 10.5 K/uL 5.2 4.3 6.4  RBC 3.87 - 5.11 MIL/uL 4.04 4.19 4.30  HGB  12.0 - 15.0 g/dL 11.7(L) 11.9 12.5  HCT 36.0 - 46.0 % 35.8(L) 36.0 37.2  PLT 150 - 400 K/uL 180 232 188.0  NEUTROABS 1.4 - 7.7 K/uL - - 4.2  LYMPHSABS 0.7 - 4.0 K/uL - - 1.9     Body mass index is 28.29 kg/m.  Orders:  Orders Placed This Encounter  Procedures  . XR Foot Complete Right   Meds ordered this encounter  Medications  . oxyCODONE-acetaminophen (PERCOCET) 5-325 MG tablet    Sig: Take 1 tablet by mouth every 4 (four) hours as needed.    Dispense:  30 tablet    Refill:  0  . gabapentin (NEURONTIN) 300 MG capsule    Sig: Take 1 capsule (300 mg total) by mouth 3 (three) times daily. 3 times a day when necessary neuropathy pain    Dispense:  90 capsule    Refill:  1     Procedures: No procedures performed  Clinical Data: No additional findings.  ROS:  All other systems negative, except as noted in the HPI. Review of Systems  Objective: Vital Signs: Ht 5\' 5"  (1.651  m)   Wt 170 lb (77.1 kg)   BMI 28.29 kg/m   Specialty Comments:  No specialty comments available.  PMFS History: Patient Active Problem List   Diagnosis Date Noted  . H/O ankle fusion 07/18/2019  . Posterior tibial tendinitis, right leg   . Sinusitis 04/10/2018  . Asthma exacerbation 03/28/2018  . SOB (shortness of breath)   . Pyelonephritis 04/15/2016  . Normochromic normocytic anemia 04/15/2016  . Elevated LFTs 04/15/2016  . HNP (herniated nucleus pulposus), lumbar 07/04/2015    Class: Acute  . Anxiety state 08/08/2013  . INSOMNIA 05/20/2008  . Asthma with bronchitis 05/15/2007  . PLEURISY 05/15/2007  . HYPERVENTILATION 05/15/2007  . COUGH 05/15/2007   Past Medical History:  Diagnosis Date  . Anxiety   . Asthma   . Chronic kidney disease    patient denies - 07/09/2019  . Complication of anesthesia    was slow to wake up with one of her surgeries  . Cough   . Headache    hx. migraines  . Hyperventilation   . Insomnia   . Pleurisy   . UTI (urinary tract infection)     Family History  Problem Relation Age of Onset  . Prostate cancer Father   . Allergic rhinitis Mother   . Breast cancer Mother 67  . Angioedema Neg Hx   . Eczema Neg Hx   . Urticaria Neg Hx   . Asthma Neg Hx     Past Surgical History:  Procedure Laterality Date  . ABDOMINAL HYSTERECTOMY    . ANKLE FUSION Right 07/11/2019   Procedure: RIGHT TALONAVICULAR AND SUBTALAR FUSION;  Surgeon: Newt Minion, MD;  Location: [place];  Service: Orthopedics;  Laterality: Right;  . LUMBAR LAMINECTOMY N/A 07/04/2015   Procedure: Left L5-S1 MICRODISCECTOMY;  Surgeon: Jessy Oto, MD;  Location: [place];  Service: Orthopedics;  Laterality: N/A;  . LUMBAR SPINE SURGERY    . UTERINE FIBROID SURGERY    . VESICOVAGINAL FISTULA CLOSURE W/ TAH     Social History   Occupational History  . Occupation: marketing/sales  Tobacco Use  . Smoking status: Never Smoker  . Smokeless tobacco: Never Used  Substance and  Sexual Activity  . Alcohol use: No  . Drug use: No  . Sexual activity: Not on file

## 2019-09-18 ENCOUNTER — Telehealth: Payer: Self-pay | Admitting: Physician Assistant

## 2019-09-18 ENCOUNTER — Other Ambulatory Visit: Payer: Self-pay | Admitting: Physician Assistant

## 2019-09-18 MED ORDER — HYDROCODONE-ACETAMINOPHEN 5-325 MG PO TABS: 1.0000 | ORAL_TABLET | ORAL | 0 refills | Status: DC | PRN

## 2019-09-18 NOTE — Telephone Encounter (Signed)
/  13/21 right talonavicular and subtalar fusion  Please see below and advise.

## 2019-09-18 NOTE — Telephone Encounter (Signed)
Patient called and wanted [place] to call in something for pain other than Oxycodone.?  Please call patient to advise.  [phone]

## 2019-09-18 NOTE — Telephone Encounter (Signed)
I contacted the patient. She doesn't think the oxycodone is helping her anymore. We discussed trying hydrocodone

## 2019-09-27 ENCOUNTER — Other Ambulatory Visit: Payer: Self-pay

## 2019-09-27 ENCOUNTER — Ambulatory Visit: Payer: Managed Care, Other (non HMO) | Admitting: Orthopedic Surgery

## 2019-09-27 ENCOUNTER — Ambulatory Visit (INDEPENDENT_AMBULATORY_CARE_PROVIDER_SITE_OTHER): Payer: Managed Care, Other (non HMO) | Admitting: Orthopedic Surgery

## 2019-09-27 ENCOUNTER — Ambulatory Visit (INDEPENDENT_AMBULATORY_CARE_PROVIDER_SITE_OTHER): Payer: Managed Care, Other (non HMO)

## 2019-09-27 DIAGNOSIS — M76821 Posterior tibial tendinitis, right leg: Secondary | ICD-10-CM

## 2019-09-27 DIAGNOSIS — M79671 Pain in right foot: Secondary | ICD-10-CM | POA: Diagnosis not present

## 2019-09-27 MED ORDER — HYDROCODONE-ACETAMINOPHEN 5-325 MG PO TABS: 1.0000 | ORAL_TABLET | ORAL | 0 refills | Status: DC | PRN

## 2019-09-28 ENCOUNTER — Encounter: Payer: Self-pay | Admitting: Orthopedic Surgery

## 2019-09-28 NOTE — Progress Notes (Signed)
Office Visit Note   Patient: [name]           Date of Birth: Jul 05, 1960           MRN: [id number] Visit Date: 09/27/2019              Requested by: [name], MD [address] [place],  [place] [address] PCP: [name], MD  Chief Complaint  Patient presents with  . Right Foot - Follow-up  . Follow-up      HPI: Patient is a 59 year old woman who is about 10 weeks out from talonavicular and subtalar fusion.  She is in a fracture boot she states she does have pain with weightbearing and has been working on Achilles stretching.  Assessment & Plan: Visit Diagnoses:  1. Right foot pain   2. Posterior tibial tendinitis, right leg     Plan: Reinforced the importance of Achilles stretching she will do this daily work on ankle range of motion and increase weightbearing in the fracture boot as she tolerates.  Repeat three-view radiographs of the right foot at follow-up.  Patient may need formalized physical therapy at follow-up.  Follow-Up Instructions: Return in about 3 weeks (around 10/18/2019).   Ortho Exam  Patient is alert, oriented, no adenopathy, well-dressed, normal affect, normal respiratory effort. Examination the incisions are healed well there is no redness no cellulitis no dystrophic changes.  Patient has generalized pain in her foot and ankle.  Her Achilles tendon is tight and has dorsiflexion only to neutral.  Imaging: XR Foot Complete Right  Result Date: 09/28/2019 Three-view radiographs of the right foot shows stable internal fixation with out evidence of loosening and appears to have stable fusion of the subtalar and talonavicular joint.  No images are attached to the encounter.  Labs: Lab Results  Component Value Date   HGBA1C  05/14/2008    5.6 (NOTE)   The ADA recommends the following therapeutic goal for glycemic   control related to Hgb A1C measurement:   Goal of Therapy:   < 7.0% Hgb A1C   Reference: American Diabetes  Association: Clinical Practice   Recommendations 2008, Diabetes Care,  2008, 31:(Suppl 1).   ESRSEDRATE 11 03/15/2019   LABURIC 6.4 03/15/2019   REPTSTATUS 04/20/2016 FINAL 04/15/2016   REPTSTATUS 04/20/2016 FINAL 04/15/2016   CULT  04/15/2016    NO GROWTH 5 DAYS Performed at [facility]    CULT  04/15/2016    NO GROWTH 5 DAYS Performed [facility]    [facility] ESCHERICHIA COLI (A) 04/09/2016     Lab Results  Component Value Date   ALBUMIN 3.2 (L) 04/18/2016   ALBUMIN 3.2 (L) 04/16/2016   ALBUMIN 3.3 (L) 04/15/2016   LABURIC 6.4 03/15/2019    Lab Results  Component Value Date   MG 1.9 04/18/2016   MG 2.0 04/16/2016   MG 2.1 05/16/2008   No results found for: VD25OH  No results found for: PREALBUMIN CBC EXTENDED Latest Ref Rng & Units 07/11/2019 03/15/2019 04/10/2018  WBC 4.0 - 10.5 K/uL 5.2 4.3 6.4  RBC 3.87 - 5.11 MIL/uL 4.04 4.19 4.30  HGB 12.0 - 15.0 g/dL 11.7(L) 11.9 12.5  HCT 36.0 - 46.0 % 35.8(L) 36.0 37.2  PLT 150 - 400 K/uL 180 232 188.0  NEUTROABS 1.4 - 7.7 K/uL - - 4.2  LYMPHSABS 0.7 - 4.0 K/uL - - 1.9     There is no height or weight on file to calculate BMI.  Orders:  Orders Placed This Encounter  Procedures  . XR Foot Complete Right   Meds ordered this encounter  Medications  . HYDROcodone-acetaminophen (NORCO/VICODIN) 5-325 MG tablet    Sig: Take 1 tablet by mouth every 4 (four) hours as needed for moderate pain.    Dispense:  30 tablet    Refill:  0     Procedures: No procedures performed  Clinical Data: No additional findings.  ROS:  All other systems negative, except as noted in the HPI. Review of Systems  Objective: Vital Signs: There were no vitals taken for this visit.  Specialty Comments:  No specialty comments available.  PMFS History: Patient Active Problem List   Diagnosis Date Noted  . H/O ankle fusion 07/18/2019  . Posterior tibial tendinitis, right leg   . Sinusitis 04/10/2018  . Asthma  exacerbation 03/28/2018  . SOB (shortness of breath)   . Pyelonephritis 04/15/2016  . Normochromic normocytic anemia 04/15/2016  . Elevated LFTs 04/15/2016  . HNP (herniated nucleus pulposus), lumbar 07/04/2015    Class: Acute  . Anxiety state 08/08/2013  . INSOMNIA 05/20/2008  . Asthma with bronchitis 05/15/2007  . PLEURISY 05/15/2007  . HYPERVENTILATION 05/15/2007  . COUGH 05/15/2007   Past Medical History:  Diagnosis Date  . Anxiety   . Asthma   . Chronic kidney disease    patient denies - 07/09/2019  . Complication of anesthesia    was slow to wake up with one of her surgeries  . Cough   . Headache    hx. migraines  . Hyperventilation   . Insomnia   . Pleurisy   . UTI (urinary tract infection)     Family History  Problem Relation Age of Onset  . Prostate cancer Father   . Allergic rhinitis Mother   . Breast cancer Mother 88  . Angioedema Neg Hx   . Eczema Neg Hx   . Urticaria Neg Hx   . Asthma Neg Hx     Past Surgical History:  Procedure Laterality Date  . ABDOMINAL HYSTERECTOMY    . ANKLE FUSION Right 07/11/2019   Procedure: RIGHT TALONAVICULAR AND SUBTALAR FUSION;  Surgeon: Newt Minion, MD;  Location: [place];  Service: Orthopedics;  Laterality: Right;  . LUMBAR LAMINECTOMY N/A 07/04/2015   Procedure: Left L5-S1 MICRODISCECTOMY;  Surgeon: Jessy Oto, MD;  Location: [place];  Service: Orthopedics;  Laterality: N/A;  . LUMBAR SPINE SURGERY    . UTERINE FIBROID SURGERY    . VESICOVAGINAL FISTULA CLOSURE W/ TAH     Social History   Occupational History  . Occupation: marketing/sales  Tobacco Use  . Smoking status: Never Smoker  . Smokeless tobacco: Never Used  Substance and Sexual Activity  . Alcohol use: No  . Drug use: No  . Sexual activity: Not on file

## 2019-10-08 ENCOUNTER — Telehealth: Payer: Self-pay | Admitting: Physician Assistant

## 2019-10-08 NOTE — Telephone Encounter (Signed)
Pt is s/p a subtalar and talonavicular fusion on 07/11/19 and states that the Hydrocodone that she takes for pain is making her itchy. She wants to know what else she can take for pain.

## 2019-10-08 NOTE — Telephone Encounter (Signed)
Called pt to advise and she is agreeable to tramadol rx. [facility].

## 2019-10-08 NOTE — Telephone Encounter (Signed)
We could call in ultram

## 2019-10-08 NOTE — Telephone Encounter (Signed)
Patient called advised the hydrocodone is making her itch and she has stopped taking it.  Patient took one tab on Friday and started back itching again. Patient said she do not have anything to take for pain. The number to contact patient is [phone]

## 2019-10-09 ENCOUNTER — Other Ambulatory Visit: Payer: Self-pay | Admitting: Orthopedic Surgery

## 2019-10-09 MED ORDER — TRAMADOL HCL 50 MG PO TABS: 50.0000 mg | ORAL_TABLET | Freq: Four times a day (QID) | ORAL | 0 refills | Status: DC | PRN

## 2019-10-16 ENCOUNTER — Other Ambulatory Visit: Payer: Self-pay

## 2019-10-16 ENCOUNTER — Encounter: Payer: Self-pay | Admitting: Orthopedic Surgery

## 2019-10-16 ENCOUNTER — Ambulatory Visit (INDEPENDENT_AMBULATORY_CARE_PROVIDER_SITE_OTHER): Payer: Managed Care, Other (non HMO)

## 2019-10-16 ENCOUNTER — Ambulatory Visit (INDEPENDENT_AMBULATORY_CARE_PROVIDER_SITE_OTHER): Payer: Managed Care, Other (non HMO) | Admitting: Orthopedic Surgery

## 2019-10-16 DIAGNOSIS — M79671 Pain in right foot: Secondary | ICD-10-CM

## 2019-10-16 MED ORDER — AMITRIPTYLINE HCL 25 MG PO TABS: 25.0000 mg | ORAL_TABLET | Freq: Every day | ORAL | 3 refills | Status: DC

## 2019-10-16 MED ORDER — DULOXETINE HCL 30 MG PO CPEP: 30.0000 mg | ORAL_CAPSULE | Freq: Every day | ORAL | 0 refills | Status: DC

## 2019-10-16 MED ORDER — GABAPENTIN 300 MG PO CAPS: 300.0000 mg | ORAL_CAPSULE | Freq: Three times a day (TID) | ORAL | 3 refills | Status: DC

## 2019-10-16 NOTE — Progress Notes (Signed)
Office Visit Note   Patient: [name]           Date of Birth: 08-Apr-1961           MRN: [id number] Visit Date: 10/16/2019              Requested by: [name], MD [address] [place],  [place] [address] PCP: [name], MD  Chief Complaint  Patient presents with  . Right Foot - Pain      HPI: Patient is a 59 year old woman who presents in follow-up status post talonavicular and subtalar fusion.  Patient states that she acutely woke up with burning pain in her foot she states she has pain all day long and complains of throbbing if it is not elevated.  She points to the tarsal tunnel region of the area where she is having the burning pain.  Assessment & Plan: Visit Diagnoses:  1. Right foot pain     Plan: Patient has developed acute complex regional pain syndrome.  We will have her use Neurontin 300 mg 3 times a day Cymbalta 30 mg with breakfast and Elavil 25 mg at night.  She will call us if she has any side effects from the medicine.  Discussed the treatment protocol for the complex regional pain syndrome.  Follow-Up Instructions: Return in about 1 week (around 10/23/2019).   Ortho Exam  Patient is alert, oriented, no adenopathy, well-dressed, normal affect, normal respiratory effort. Examination patient has hypersensitivity to light touch over the tarsal tunnel region as well as over the plantar aspect of her foot.  The surgical incisions are well-healed there is no signs or symptoms of infection.  She does have swelling her foot has normal color and texture.  There is no hyperhidrosis.  Imaging: XR Foot Complete Right  Result Date: 10/16/2019 Three-view radiographs of the right foot shows stable healing of the subtalar and talonavicular joint no hardware failure no complications.  No lytic changes.  No images are attached to the encounter.  Labs: Lab Results  Component Value Date   HGBA1C  05/14/2008    5.6 (NOTE)   The ADA recommends  the following therapeutic goal for glycemic   control related to Hgb A1C measurement:   Goal of Therapy:   < 7.0% Hgb A1C   Reference: American Diabetes Association: Clinical Practice   Recommendations 2008, Diabetes Care,  2008, 31:(Suppl 1).   ESRSEDRATE 11 03/15/2019   LABURIC 6.4 03/15/2019   REPTSTATUS 04/20/2016 FINAL 04/15/2016   REPTSTATUS 04/20/2016 FINAL 04/15/2016   CULT  04/15/2016    NO GROWTH 5 DAYS Performed at [facility]    CULT  04/15/2016    NO GROWTH 5 DAYS Performed at [facility]    [facility] ESCHERICHIA COLI (A) 04/09/2016     Lab Results  Component Value Date   ALBUMIN 3.2 (L) 04/18/2016   ALBUMIN 3.2 (L) 04/16/2016   ALBUMIN 3.3 (L) 04/15/2016   LABURIC 6.4 03/15/2019    Lab Results  Component Value Date   MG 1.9 04/18/2016   MG 2.0 04/16/2016   MG 2.1 05/16/2008   No results found for: VD25OH  No results found for: PREALBUMIN CBC EXTENDED Latest Ref Rng & Units 07/11/2019 03/15/2019 04/10/2018  WBC 4.0 - 10.5 K/uL 5.2 4.3 6.4  RBC 3.87 - 5.11 MIL/uL 4.04 4.19 4.30  HGB 12.0 - 15.0 g/dL 11.7(L) 11.9 12.5  HCT 36.0 - 46.0 % 35.8(L) 36.0 37.2  PLT 150 -  400 K/uL 180 232 188.0  NEUTROABS 1.4 - 7.7 K/uL - - 4.2  LYMPHSABS 0.7 - 4.0 K/uL - - 1.9     There is no height or weight on file to calculate BMI.  Orders:  Orders Placed This Encounter  Procedures  . XR Foot Complete Right   Meds ordered this encounter  Medications  . gabapentin (NEURONTIN) 300 MG capsule    Sig: Take 1 capsule (300 mg total) by mouth 3 (three) times daily. 3 times a day when necessary neuropathy pain    Dispense:  90 capsule    Refill:  3  . DULoxetine (CYMBALTA) 30 MG capsule    Sig: Take 1 capsule (30 mg total) by mouth daily. Qam    Dispense:  30 capsule    Refill:  0  . amitriptyline (ELAVIL) 25 MG tablet    Sig: Take 1 tablet (25 mg total) by mouth at bedtime.    Dispense:  30 tablet    Refill:  3     Procedures: No procedures  performed  Clinical Data: No additional findings.  ROS:  All other systems negative, except as noted in the HPI. Review of Systems  Objective: Vital Signs: There were no vitals taken for this visit.  Specialty Comments:  No specialty comments available.  PMFS History: Patient Active Problem List   Diagnosis Date Noted  . H/O ankle fusion 07/18/2019  . Posterior tibial tendinitis, right leg   . Sinusitis 04/10/2018  . Asthma exacerbation 03/28/2018  . SOB (shortness of breath)   . Pyelonephritis 04/15/2016  . Normochromic normocytic anemia 04/15/2016  . Elevated LFTs 04/15/2016  . HNP (herniated nucleus pulposus), lumbar 07/04/2015    Class: Acute  . Anxiety state 08/08/2013  . INSOMNIA 05/20/2008  . Asthma with bronchitis 05/15/2007  . PLEURISY 05/15/2007  . HYPERVENTILATION 05/15/2007  . COUGH 05/15/2007   Past Medical History:  Diagnosis Date  . Anxiety   . Asthma   . Chronic kidney disease    patient denies - 07/09/2019  . Complication of anesthesia    was slow to wake up with one of her surgeries  . Cough   . Headache    hx. migraines  . Hyperventilation   . Insomnia   . Pleurisy   . UTI (urinary tract infection)     Family History  Problem Relation Age of Onset  . Prostate cancer Father   . Allergic rhinitis Mother   . Breast cancer Mother 76  . Angioedema Neg Hx   . Eczema Neg Hx   . Urticaria Neg Hx   . Asthma Neg Hx     Past Surgical History:  Procedure Laterality Date  . ABDOMINAL HYSTERECTOMY    . ANKLE FUSION Right 07/11/2019   Procedure: RIGHT TALONAVICULAR AND SUBTALAR FUSION;  Surgeon: Newt Minion, MD;  Location: [facility];  Service: Orthopedics;  Laterality: Right;  . LUMBAR LAMINECTOMY N/A 07/04/2015   Procedure: Left L5-S1 MICRODISCECTOMY;  Surgeon: Jessy Oto, MD;  Location: [facility];  Service: Orthopedics;  Laterality: N/A;  . LUMBAR SPINE SURGERY    . UTERINE FIBROID SURGERY    . VESICOVAGINAL FISTULA CLOSURE W/ TAH     Social  History   Occupational History  . Occupation: marketing/sales  Tobacco Use  . Smoking status: Never Smoker  . Smokeless tobacco: Never Used  Substance and Sexual Activity  . Alcohol use: No  . Drug use: No  . Sexual activity: Not on file

## 2019-10-18 ENCOUNTER — Ambulatory Visit: Payer: Managed Care, Other (non HMO) | Admitting: Orthopedic Surgery

## 2019-10-25 ENCOUNTER — Ambulatory Visit (INDEPENDENT_AMBULATORY_CARE_PROVIDER_SITE_OTHER): Payer: Managed Care, Other (non HMO) | Admitting: Orthopedic Surgery

## 2019-10-25 ENCOUNTER — Other Ambulatory Visit: Payer: Self-pay

## 2019-10-25 ENCOUNTER — Encounter: Payer: Self-pay | Admitting: Orthopedic Surgery

## 2019-10-25 VITALS — Ht 65.0 in | Wt 170.0 lb

## 2019-10-25 DIAGNOSIS — M25571 Pain in right ankle and joints of right foot: Secondary | ICD-10-CM

## 2019-10-26 ENCOUNTER — Other Ambulatory Visit: Payer: Self-pay | Admitting: Physician Assistant

## 2019-10-26 ENCOUNTER — Telehealth: Payer: Self-pay | Admitting: Orthopedic Surgery

## 2019-10-26 ENCOUNTER — Other Ambulatory Visit: Payer: Self-pay

## 2019-10-26 MED ORDER — TRAMADOL HCL 50 MG PO TABS: 50.0000 mg | ORAL_TABLET | Freq: Four times a day (QID) | ORAL | 0 refills | Status: DC | PRN

## 2019-10-26 NOTE — Telephone Encounter (Signed)
I called pt to advise of refill and she requested for the letter to work from home to have a start date of 11/12/19 through 12/13/19 this was printed and is at the desk for pick up.

## 2019-10-26 NOTE — Telephone Encounter (Signed)
Pt is s/p a right subtalar and talonavicular fusion 07/11/19 and was in the office yesterday placed in a SLC. She is requesting refill on tramadol please advise and I will write her letter for work.

## 2019-10-26 NOTE — Telephone Encounter (Signed)
Pt called stating she needed a refill of her tramadol medication [facility] [address]; pt also stated she needed a note for work clearing her to start work from home on 11/12/19, pt stated the note can be emailed.   [email or web address] [phone]

## 2019-10-26 NOTE — Telephone Encounter (Signed)
Tramadol refilled.

## 2019-10-29 ENCOUNTER — Encounter: Payer: Self-pay | Admitting: Orthopedic Surgery

## 2019-10-29 NOTE — Progress Notes (Signed)
Office Visit Note   Patient: [name]           Date of Birth: 06/06/1961           MRN: [id number] Visit Date: 10/25/2019              Requested by: [name], MD [address] [place],  [place] [address] PCP: [name], MD  Chief Complaint  Patient presents with  . 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  Patient has been on Neurontin and Cymbalta and Elavil she states this initially helped but she states that this Saturday the pain got worse.  Assessment & Plan: Visit Diagnoses:  1. Pain in right ankle and joints of right foot     Plan: Patient may be having pain from micromotion at the fusion sites.  We will place her in a short leg cast the importance of elevation and decreased swelling was discussed.  Reevaluate in 2 weeks.  Patient states that she will need to work note to start working from home we will complete this form when she is ready to return to work from home.  Follow-Up Instructions: Return in about 2 weeks (around 11/08/2019).   Ortho Exam  Patient is alert, oriented, no adenopathy, well-dressed, normal affect, normal respiratory effort. Examination there is no dystrophic changes to the foot the temperature is normal she has a good dorsalis pedis pulse there is minimal swelling the incisions are well-healed.  Radiographs are reviewed which shows stable internal fixation with no clinical signs of hardware failure  Imaging: No results found. No images are attached to the encounter.  Labs: Lab Results  Component Value Date   HGBA1C  05/14/2008    5.6 (NOTE)   The ADA recommends the following therapeutic goal for glycemic   control related to Hgb A1C measurement:   Goal of Therapy:   < 7.0% Hgb A1C   Reference: American Diabetes Association: Clinical  Practice   Recommendations 2008, Diabetes Care,  2008, 31:(Suppl 1).   ESRSEDRATE 11 03/15/2019   LABURIC 6.4 03/15/2019   REPTSTATUS 04/20/2016 FINAL 04/15/2016   REPTSTATUS 04/20/2016 FINAL 04/15/2016   CULT  04/15/2016    NO GROWTH 5 DAYS Performed at [facility]    CULT  04/15/2016    NO GROWTH 5 DAYS Performed at [facility]    [facility] ESCHERICHIA COLI (A) 04/09/2016     Lab Results  Component Value Date   ALBUMIN 3.2 (L) 04/18/2016   ALBUMIN 3.2 (L) 04/16/2016   ALBUMIN 3.3 (L) 04/15/2016   LABURIC 6.4 03/15/2019    Lab Results  Component Value Date   MG 1.9 04/18/2016   MG 2.0 04/16/2016   MG 2.1 05/16/2008   No results found for: VD25OH  No results found for: PREALBUMIN CBC EXTENDED Latest Ref Rng & Units 07/11/2019 03/15/2019 04/10/2018  WBC 4.0 - 10.5 K/uL 5.2 4.3 6.4  RBC 3.87 - 5.11 MIL/uL 4.04 4.19 4.30  HGB 12.0 - 15.0 g/dL 11.7(L) 11.9 12.5  HCT 36.0 - 46.0 % 35.8(L) 36.0 37.2  PLT 150 - 400 K/uL 180 232 188.0  NEUTROABS 1.4 - 7.7 K/uL - - 4.2  LYMPHSABS 0.7 - 4.0 K/uL - - 1.9     Body mass index is 28.29 kg/m.  Orders:  No orders of the defined types were placed in this encounter.  No orders of the defined types were placed in this encounter.    Procedures: No procedures performed  Clinical Data: No additional findings.  ROS:  All other systems negative, except as noted in the HPI. Review of Systems  Objective: Vital Signs: Ht 5\' 5"  (1.651 m)   Wt 170 lb (77.1 kg)   BMI 28.29 kg/m   Specialty Comments:  No specialty comments available.  PMFS History: Patient Active Problem List   Diagnosis Date Noted  . H/O ankle fusion 07/18/2019  . Posterior tibial tendinitis, right leg   . Sinusitis 04/10/2018  . Asthma exacerbation 03/28/2018  . SOB (shortness of breath)   . Pyelonephritis 04/15/2016  . Normochromic normocytic anemia 04/15/2016  . Elevated LFTs 04/15/2016  . HNP (herniated nucleus pulposus), lumbar  07/04/2015    Class: Acute  . Anxiety state 08/08/2013  . INSOMNIA 05/20/2008  . Asthma with bronchitis 05/15/2007  . PLEURISY 05/15/2007  . HYPERVENTILATION 05/15/2007  . COUGH 05/15/2007   Past Medical History:  Diagnosis Date  . Anxiety   . Asthma   . Chronic kidney disease    patient denies - 07/09/2019  . Complication of anesthesia    was slow to wake up with one of her surgeries  . Cough   . Headache    hx. migraines  . Hyperventilation   . Insomnia   . Pleurisy   . UTI (urinary tract infection)     Family History  Problem Relation Age of Onset  . Prostate cancer Father   . Allergic rhinitis Mother   . Breast cancer Mother 15  . Angioedema Neg Hx   . Eczema Neg Hx   . Urticaria Neg Hx   . Asthma Neg Hx     Past Surgical History:  Procedure Laterality Date  . ABDOMINAL HYSTERECTOMY    . ANKLE FUSION Right 07/11/2019   Procedure: RIGHT TALONAVICULAR AND SUBTALAR FUSION;  Surgeon: Newt Minion, MD;  Location: [facility];  Service: Orthopedics;  Laterality: Right;  . LUMBAR LAMINECTOMY N/A 07/04/2015   Procedure: Left L5-S1 MICRODISCECTOMY;  Surgeon: Jessy Oto, MD;  Location: [facility];  Service: Orthopedics;  Laterality: N/A;  . LUMBAR SPINE SURGERY    . UTERINE FIBROID SURGERY    . VESICOVAGINAL FISTULA CLOSURE W/ TAH     Social History   Occupational History  . Occupation: marketing/sales  Tobacco Use  . Smoking status: Never Smoker  . Smokeless tobacco: Never Used  Substance and Sexual Activity  . Alcohol use: No  . Drug use: No  . Sexual activity: Not on file

## 2019-11-07 ENCOUNTER — Other Ambulatory Visit: Payer: Self-pay | Admitting: Orthopedic Surgery

## 2019-11-07 NOTE — Telephone Encounter (Signed)
Do you want to refill this medication?

## 2019-11-08 ENCOUNTER — Ambulatory Visit (INDEPENDENT_AMBULATORY_CARE_PROVIDER_SITE_OTHER): Payer: Managed Care, Other (non HMO) | Admitting: Physician Assistant

## 2019-11-08 ENCOUNTER — Other Ambulatory Visit: Payer: Self-pay

## 2019-11-08 ENCOUNTER — Encounter: Payer: Self-pay | Admitting: Orthopedic Surgery

## 2019-11-08 DIAGNOSIS — Z981 Arthrodesis status: Secondary | ICD-10-CM

## 2019-11-08 MED ORDER — TRAMADOL HCL 50 MG PO TABS: 50.0000 mg | ORAL_TABLET | Freq: Four times a day (QID) | ORAL | 0 refills | Status: DC | PRN

## 2019-11-08 NOTE — Progress Notes (Signed)
Office Visit Note   Patient: [name]           Date of Birth: 1960/09/01           MRN: [id number] Visit Date: 11/08/2019              Requested by: Newton Pigg, MD [address] [place],  [place] [address] PCP: Newton Pigg, MD  Chief Complaint  Patient presents with  . Right Foot - Pain, Follow-up      HPI: The patient presents today for follow-up on her foot.  She has continued to have pain status post subtalar talonavicular fusion. At her last visit 2 weeks ago she was placed in a short leg nonweightbearing cast.  She still has the throbbing in her heel but the burning pain has calm down Assessment & Plan: Visit Diagnoses: No diagnosis found.  Plan: Patient was seen today by Dr. Sharol Given we recommend another short leg nonweightbearing cast for 3 weeks follow-up at that time and remove the cast we will reevaluate her then  Follow-Up Instructions: No follow-ups on file.   Ortho Exam  Patient is alert, oriented, no adenopathy, well-dressed, normal affect, normal respiratory effort. Focused examination of her right ankle demonstrates skin color and temperature is normal.  Strong dorsalis pedis pulse.  Skin is in good condition.  Hypersensitivity has calmed down a bit Swelling is well controlled and she has skin wrinkling Imaging: No results found. No images are attached to the encounter.  Labs: Lab Results  Component Value Date   HGBA1C  05/14/2008    5.6 (NOTE)   The ADA recommends the following therapeutic goal for glycemic   control related to Hgb A1C measurement:   Goal of Therapy:   < 7.0% Hgb A1C   Reference: American Diabetes Association: Clinical Practice   Recommendations 2008, Diabetes Care,  2008, 31:(Suppl 1).   ESRSEDRATE 11 03/15/2019   LABURIC 6.4 03/15/2019   REPTSTATUS 04/20/2016 FINAL 04/15/2016   REPTSTATUS 04/20/2016 FINAL 04/15/2016   CULT  04/15/2016    NO GROWTH 5 DAYS Performed at [facility]   04/15/2016    NO GROWTH 5 DAYS Performed at [facility] (A) 04/09/2016     Lab Results  Component Value Date   ALBUMIN 3.2 (L) 04/18/2016   ALBUMIN 3.2 (L) 04/16/2016   ALBUMIN 3.3 (L) 04/15/2016   LABURIC 6.4 03/15/2019    Lab Results  Component Value Date   MG 1.9 04/18/2016   MG 2.0 04/16/2016   MG 2.1 05/16/2008   No results found for: VD25OH  No results found for: PREALBUMIN CBC EXTENDED Latest Ref Rng & Units 07/11/2019 03/15/2019 04/10/2018  WBC 4.0 - 10.5 K/uL 5.2 4.3 6.4  RBC 3.87 - 5.11 MIL/uL 4.04 4.19 4.30  HGB 12.0 - 15.0 g/dL 11.7(L) 11.9 12.5  HCT 36.0 - 46.0 % 35.8(L) 36.0 37.2  PLT 150 - 400 K/uL 180 232 188.0  NEUTROABS 1.4 - 7.7 K/uL - - 4.2  LYMPHSABS 0.7 - 4.0 K/uL - - 1.9     There is no height or weight on file to calculate BMI.  Orders:  No orders of the defined types were placed in this encounter.  Meds ordered this encounter  Medications  . traMADol (ULTRAM) 50 MG tablet    Sig: Take 1 tablet (50 mg total) by mouth every 6 (six) hours as needed for moderate pain.  Dispense:  30 tablet    Refill:  0     Procedures: No procedures performed  Clinical Data: No additional findings.  ROS:  All other systems negative, except as noted in the HPI. Review of Systems  Objective: Vital Signs: There were no vitals taken for this visit.  Specialty Comments:  No specialty comments available.  PMFS History: Patient Active Problem List   Diagnosis Date Noted  . H/O ankle fusion 07/18/2019  . Posterior tibial tendinitis, right leg   . Sinusitis 04/10/2018  . Asthma exacerbation 03/28/2018  . SOB (shortness of breath)   . Pyelonephritis 04/15/2016  . Normochromic normocytic anemia 04/15/2016  . Elevated LFTs 04/15/2016  . HNP (herniated nucleus pulposus), lumbar 07/04/2015    Class: Acute  . Anxiety state 08/08/2013  . INSOMNIA 05/20/2008  . Asthma with bronchitis 05/15/2007  . PLEURISY  05/15/2007  . HYPERVENTILATION 05/15/2007  . COUGH 05/15/2007   Past Medical History:  Diagnosis Date  . Anxiety   . Asthma   . Chronic kidney disease    patient denies - 07/09/2019  . Complication of anesthesia    was slow to wake up with one of her surgeries  . Cough   . Headache    hx. migraines  . Hyperventilation   . Insomnia   . Pleurisy   . UTI (urinary tract infection)     Family History  Problem Relation Age of Onset  . Prostate cancer Father   . Allergic rhinitis Mother   . Breast cancer Mother 38  . Angioedema Neg Hx   . Eczema Neg Hx   . Urticaria Neg Hx   . Asthma Neg Hx     Past Surgical History:  Procedure Laterality Date  . ABDOMINAL HYSTERECTOMY    . ANKLE FUSION Right 07/11/2019   Procedure: RIGHT TALONAVICULAR AND SUBTALAR FUSION;  Surgeon: Nadara Mustard, MD;  Location: [facility];  Service: Orthopedics;  Laterality: Right;  . LUMBAR LAMINECTOMY N/A 07/04/2015   Procedure: Left L5-S1 MICRODISCECTOMY;  Surgeon: Kerrin Champagne, MD;  Location: [facility] OR;  Service: Orthopedics;  Laterality: N/A;  . LUMBAR SPINE SURGERY    . UTERINE FIBROID SURGERY    . VESICOVAGINAL FISTULA CLOSURE W/ TAH     Social History   Occupational History  . Occupation: marketing/sales  Tobacco Use  . Smoking status: Never Smoker  . Smokeless tobacco: Never Used  Substance and Sexual Activity  . Alcohol use: No  . Drug use: No  . Sexual activity: Not on file

## 2019-11-09 ENCOUNTER — Telehealth: Payer: Self-pay | Admitting: Physician Assistant

## 2019-11-09 NOTE — Telephone Encounter (Signed)
Patient called advised [facility] [address] It was sent to the wrong pharmacy. Patient said she canceled the Rx so that it can be sent to the correct location. The number to contact patient is [phone]

## 2019-11-09 NOTE — Telephone Encounter (Signed)
Called into her pharmacy  

## 2019-11-12 ENCOUNTER — Telehealth: Payer: Self-pay | Admitting: Orthopedic Surgery

## 2019-11-12 NOTE — Telephone Encounter (Signed)
Work note made. Ready for pick up at the front desk. Patient aware.

## 2019-11-12 NOTE — Telephone Encounter (Signed)
Please advise 

## 2019-11-12 NOTE — Telephone Encounter (Signed)
Patient called. She would like for [place] to know that she is not able to elevate her leg while at work. Would like to know what she should do. Her call back number is [phone]

## 2019-11-12 NOTE — Telephone Encounter (Signed)
We can take her off work if she cant elevate her leg and it is difficult for her to work until her next visit

## 2019-11-13 ENCOUNTER — Telehealth: Payer: Self-pay | Admitting: Orthopedic Surgery

## 2019-11-13 NOTE — Telephone Encounter (Signed)
Faxed

## 2019-11-13 NOTE — Telephone Encounter (Signed)
Patient called asked if Autumn would have the note can be faxed to Sidonie Dickens- HR Benefits Manager   [phone] Ph# is [phone]   Patient said she could not get anyone to pick up note for her.  The number to contact patient is [phone]

## 2019-11-14 ENCOUNTER — Telehealth: Payer: Self-pay | Admitting: Physician Assistant

## 2019-11-14 MED ORDER — HYDROCODONE-ACETAMINOPHEN 5-325 MG PO TABS: 1.0000 | ORAL_TABLET | Freq: Four times a day (QID) | ORAL | 0 refills | Status: DC | PRN

## 2019-11-14 NOTE — Telephone Encounter (Signed)
Pt is s/p an ankle fusion 06/2019 and currently is in a cast. States that tramadol is not working for her and she would like a refill of Oxycodone. Please advise.

## 2019-11-14 NOTE — Telephone Encounter (Signed)
[facility] [address]

## 2019-11-14 NOTE — Telephone Encounter (Signed)
Pt called in stating the Tramadol isn't helping at all and she would like to switch back to the oxycodone.   [phone]

## 2019-11-14 NOTE — Telephone Encounter (Signed)
I called and lm on vm to advise that rx has been sent to pharm to call with any other questions.

## 2019-11-29 ENCOUNTER — Ambulatory Visit (INDEPENDENT_AMBULATORY_CARE_PROVIDER_SITE_OTHER): Payer: Managed Care, Other (non HMO) | Admitting: Orthopedic Surgery

## 2019-11-29 ENCOUNTER — Encounter: Payer: Self-pay | Admitting: Orthopedic Surgery

## 2019-11-29 ENCOUNTER — Ambulatory Visit: Payer: Self-pay

## 2019-11-29 ENCOUNTER — Other Ambulatory Visit: Payer: Self-pay

## 2019-11-29 VITALS — Ht 65.0 in | Wt 170.0 lb

## 2019-11-29 DIAGNOSIS — M25571 Pain in right ankle and joints of right foot: Secondary | ICD-10-CM | POA: Diagnosis not present

## 2019-11-29 DIAGNOSIS — M25572 Pain in left ankle and joints of left foot: Secondary | ICD-10-CM

## 2019-11-29 DIAGNOSIS — M79672 Pain in left foot: Secondary | ICD-10-CM | POA: Diagnosis not present

## 2019-11-29 MED ORDER — OXYCODONE-ACETAMINOPHEN 5-325 MG PO TABS: 1.0000 | ORAL_TABLET | ORAL | 0 refills | Status: DC | PRN

## 2019-11-30 ENCOUNTER — Telehealth: Payer: Self-pay | Admitting: Orthopedic Surgery

## 2019-11-30 NOTE — Telephone Encounter (Signed)
Patient was called and stated that Dr Lajoyce Corners did write her a Rx for knee scooter but she will need documentation stating why she will need it. I advised patient that we are awaiting for Dr Lajoyce Corners to dictate so we can provide advance with the information that is needed. Message is awaiting for dictation and Rx. Will follow-up next week.

## 2019-11-30 NOTE — Telephone Encounter (Signed)
Patient called.   She needs an Rx and proof of medical necessity [facility].   Call back: [phone]

## 2019-12-03 ENCOUNTER — Encounter: Payer: Self-pay | Admitting: Orthopedic Surgery

## 2019-12-03 NOTE — Progress Notes (Signed)
Office Visit Note   Patient: [name]           Date of Birth: 10-12-1960           MRN: [id number] Visit Date: 11/29/2019              Requested by: Newton Pigg, MD [address] [place],  [place] [address] PCP: Newton Pigg, MD  Chief Complaint  Patient presents with  . Right Foot - Follow-up    1/13/*21 right talonavicular and subtalar fusion   . Left Foot - Pain      HPI: Patient is a 59 year old woman who was about 5 months status post right foot talonavicular and subtalar fusion.  She is most recently been in a short leg cast nonweightbearing on the right side.  Patient complains of globalized pain in both feet.  She states of both feet hurt equally.  She states that the left foot has hurt worse over the past several months.  Assessment & Plan: Visit Diagnoses:  1. Pain in right ankle and joints of right foot   2. Pain in left foot     Plan: Patient was given a refill prescription for Percocet she will continue with the Neurontin and Cymbalta and Elavil.  She is given a prescription for a knee scooter and will set her up with physical therapy upstairs.  Follow-Up Instructions: Return in about 4 weeks (around 12/27/2019).   Ortho Exam  Patient is alert, oriented, no adenopathy, well-dressed, normal affect, normal respiratory effort. Examination patient has good dorsalis pedis pulse bilaterally she has pain on the plantar aspect of the right heel and global pain of the left foot she states she has not had relief with previous plantar fascia injections.  There is no redness no cellulitis no signs of infection in either foot.  Imaging: No results found. No images are attached to the encounter.  Labs: Lab Results  Component Value Date   HGBA1C  05/14/2008    5.6 (NOTE)   The ADA recommends the following therapeutic goal for glycemic   control related to Hgb A1C measurement:   Goal of Therapy:   < 7.0% Hgb A1C   Reference: American Diabetes  Association: Clinical Practice   Recommendations 2008, Diabetes Care,  2008, 31:(Suppl 1).   ESRSEDRATE 11 03/15/2019   LABURIC 6.4 03/15/2019   REPTSTATUS 04/20/2016 FINAL 04/15/2016   REPTSTATUS 04/20/2016 FINAL 04/15/2016   CULT  04/15/2016    NO GROWTH 5 DAYS Performed at [facility]  04/15/2016    NO GROWTH 5 DAYS Performed at [facility] (A) 04/09/2016     Lab Results  Component Value Date   ALBUMIN 3.2 (L) 04/18/2016   ALBUMIN 3.2 (L) 04/16/2016   ALBUMIN 3.3 (L) 04/15/2016   LABURIC 6.4 03/15/2019    Lab Results  Component Value Date   MG 1.9 04/18/2016   MG 2.0 04/16/2016   MG 2.1 05/16/2008   No results found for: VD25OH  No results found for: PREALBUMIN CBC EXTENDED Latest Ref Rng & Units 07/11/2019 03/15/2019 04/10/2018  WBC 4.0 - 10.5 K/uL 5.2 4.3 6.4  RBC 3.87 - 5.11 MIL/uL 4.04 4.19 4.30  HGB 12.0 - 15.0 g/dL 11.7(L) 11.9 12.5  HCT 36.0 - 46.0 % 35.8(L) 36.0 37.2  PLT 150 - 400 K/uL 180 232 188.0  NEUTROABS 1.4 - 7.7 K/uL - - 4.2  LYMPHSABS 0.7 - 4.0  K/uL - - 1.9     Body mass index is 28.29 kg/m.  Orders:  Orders Placed This Encounter  Procedures  . XR Foot Complete Right  . XR Foot Complete Left  . Ambulatory referral to Physical Therapy   Meds ordered this encounter  Medications  . oxyCODONE-acetaminophen (PERCOCET/ROXICET) 5-325 MG tablet    Sig: Take 1 tablet by mouth every 4 (four) hours as needed for severe pain.    Dispense:  30 tablet    Refill:  0     Procedures: No procedures performed  Clinical Data: No additional findings.  ROS:  All other systems negative, except as noted in the HPI. Review of Systems  Objective: Vital Signs: Ht 5\' 5"  (1.651 m)   Wt 170 lb (77.1 kg)   BMI 28.29 kg/m   Specialty Comments:  No specialty comments available.  PMFS History: Patient Active Problem List   Diagnosis Date Noted  . H/O ankle fusion 07/18/2019  . Posterior tibial  tendinitis, right leg   . Sinusitis 04/10/2018  . Asthma exacerbation 03/28/2018  . SOB (shortness of breath)   . Pyelonephritis 04/15/2016  . Normochromic normocytic anemia 04/15/2016  . Elevated LFTs 04/15/2016  . HNP (herniated nucleus pulposus), lumbar 07/04/2015    Class: Acute  . Anxiety state 08/08/2013  . INSOMNIA 05/20/2008  . Asthma with bronchitis 05/15/2007  . PLEURISY 05/15/2007  . HYPERVENTILATION 05/15/2007  . COUGH 05/15/2007   Past Medical History:  Diagnosis Date  . Anxiety   . Asthma   . Chronic kidney disease    patient denies - 07/09/2019  . Complication of anesthesia    was slow to wake up with one of her surgeries  . Cough   . Headache    hx. migraines  . Hyperventilation   . Insomnia   . Pleurisy   . UTI (urinary tract infection)     Family History  Problem Relation Age of Onset  . Prostate cancer Father   . Allergic rhinitis Mother   . Breast cancer Mother 6  . Angioedema Neg Hx   . Eczema Neg Hx   . Urticaria Neg Hx   . Asthma Neg Hx     Past Surgical History:  Procedure Laterality Date  . ABDOMINAL HYSTERECTOMY    . ANKLE FUSION Right 07/11/2019   Procedure: RIGHT TALONAVICULAR AND SUBTALAR FUSION;  Surgeon: 07/13/2019, MD;  Location: [facility] OR;  Service: Orthopedics;  Laterality: Right;  . LUMBAR LAMINECTOMY N/A 07/04/2015   Procedure: Left L5-S1 MICRODISCECTOMY;  Surgeon: 09/01/2015, MD;  Location: [facility] OR;  Service: Orthopedics;  Laterality: N/A;  . LUMBAR SPINE SURGERY    . UTERINE FIBROID SURGERY    . VESICOVAGINAL FISTULA CLOSURE W/ TAH     Social History   Occupational History  . Occupation: marketing/sales  Tobacco Use  . Smoking status: Never Smoker  . Smokeless tobacco: Never Used  Substance and Sexual Activity  . Alcohol use: No  . Drug use: No  . Sexual activity: Not on file

## 2019-12-04 ENCOUNTER — Telehealth: Payer: Self-pay | Admitting: Orthopedic Surgery

## 2019-12-04 NOTE — Telephone Encounter (Signed)
[name] from [employer] called. She has some questions about the return to work date on the note that was faxed. Her call back number is [phone]

## 2019-12-04 NOTE — Telephone Encounter (Signed)
Patient called stating that her employer's HR needs a note that states the specific dates for her RTW.  She stated that Dr. Lajoyce Corners released her to go back to work tomorrow (6/9).  Patient also requested that the note specify that she can work from home for 2 weeks and then return to the office after the two weeks.  She is needing this faxed in today at [phone].  CB#[phone].  Thank you

## 2019-12-04 NOTE — Telephone Encounter (Signed)
[name], from [employer], the patient's employer called stating that the patient advised them that she was able to return to work on Wednesday, June 9th, however, they are needing the RTW faxed to them at [phone].  [name] CB#[phone].  Thank you.

## 2019-12-05 ENCOUNTER — Encounter: Payer: Self-pay | Admitting: Orthopedic Surgery

## 2019-12-05 ENCOUNTER — Other Ambulatory Visit: Payer: Self-pay | Admitting: Physician Assistant

## 2019-12-05 NOTE — Telephone Encounter (Signed)
Patient's note was updated and faxed.

## 2019-12-05 NOTE — Telephone Encounter (Signed)
Patient will like something sent to pharmacy for nausea. Please advise, thanks.   Patient note was faxed to [phone] for RTW.

## 2019-12-05 NOTE — Telephone Encounter (Signed)
[name] was called and discussed that patient is able to return to work.

## 2019-12-05 NOTE — Telephone Encounter (Signed)
That is fine 

## 2019-12-11 ENCOUNTER — Encounter: Payer: Self-pay | Admitting: Family

## 2019-12-11 ENCOUNTER — Ambulatory Visit (INDEPENDENT_AMBULATORY_CARE_PROVIDER_SITE_OTHER): Payer: Managed Care, Other (non HMO) | Admitting: Orthopedic Surgery

## 2019-12-11 ENCOUNTER — Other Ambulatory Visit: Payer: Self-pay

## 2019-12-11 ENCOUNTER — Ambulatory Visit: Payer: Managed Care, Other (non HMO) | Admitting: Physical Therapy

## 2019-12-11 VITALS — Ht 65.0 in | Wt 170.0 lb

## 2019-12-11 DIAGNOSIS — M25571 Pain in right ankle and joints of right foot: Secondary | ICD-10-CM

## 2019-12-11 DIAGNOSIS — G90521 Complex regional pain syndrome I of right lower limb: Secondary | ICD-10-CM | POA: Diagnosis not present

## 2019-12-11 MED ORDER — TRAMADOL HCL 50 MG PO TABS: 100.0000 mg | ORAL_TABLET | Freq: Four times a day (QID) | ORAL | 0 refills | Status: DC | PRN

## 2019-12-11 NOTE — Progress Notes (Signed)
Office Visit Note   Patient: [name]           Date of Birth: Aug 05, 1960           MRN: [id number] Visit Date: 12/11/2019              Requested by: [name], MD [address] [place],  [place] [address] PCP: [name], MD  Chief Complaint  Patient presents with  . Right Foot - Follow-up    07/11/19 right talonavicular and subtalar fusion       HPI: Patient is seen in follow-up status post talonavicular and subtalar fusion postoperatively patient has developed progressive complex regional pain syndrome type one of the right lower extremity.  She has been on Cymbalta Elavil and Neurontin as well as Percocet without relief patient states she has increasing pain to light touch and ability to weight-bear.  She states she did have some temporary relief with a cast.  Assessment & Plan: Visit Diagnoses:  1. Pain in right ankle and joints of right foot   2. Complex regional pain syndrome type 1 of right lower extremity     Plan: Due to the patient's worsening of her complex regional pain syndrome despite optimal medical management we will request authorization for a sympathetic block.  We will try to get this sympathetic block this week.  I will call in a prescription for tramadol.  Follow-Up Instructions: Return in about 1 week (around 12/18/2019).   Ortho Exam  Patient is alert, oriented, no adenopathy, well-dressed, normal affect, normal respiratory effort. Examination patient has hypersensitivity to light touch her skin is cool to the touch.  There is no redness no cellulitis no signs of infection she has pain with any attempted range of motion of the foot or ankle.  Patient is very tearful.  Imaging: No results found. No images are attached to the encounter.  Labs: Lab Results  Component Value Date   HGBA1C  05/14/2008    5.6 (NOTE)   The ADA recommends the following therapeutic goal for glycemic   control related to Hgb A1C measurement:    Goal of Therapy:   < 7.0% Hgb A1C   Reference: American Diabetes Association: Clinical Practice   Recommendations 2008, Diabetes Care,  2008, 31:(Suppl 1).   ESRSEDRATE 11 03/15/2019   LABURIC 6.4 03/15/2019   REPTSTATUS 04/20/2016 FINAL 04/15/2016   REPTSTATUS 04/20/2016 FINAL 04/15/2016   CULT  04/15/2016    NO GROWTH 5 DAYS Performed at [facility]    CULT  04/15/2016    NO GROWTH 5 DAYS Performed at [facility]    [facility] ESCHERICHIA COLI (A) 04/09/2016     Lab Results  Component Value Date   ALBUMIN 3.2 (L) 04/18/2016   ALBUMIN 3.2 (L) 04/16/2016   ALBUMIN 3.3 (L) 04/15/2016   LABURIC 6.4 03/15/2019    Lab Results  Component Value Date   MG 1.9 04/18/2016   MG 2.0 04/16/2016   MG 2.1 05/16/2008   No results found for: VD25OH  No results found for: PREALBUMIN CBC EXTENDED Latest Ref Rng & Units 07/11/2019 03/15/2019 04/10/2018  WBC 4.0 - 10.5 K/uL 5.2 4.3 6.4  RBC 3.87 - 5.11 MIL/uL 4.04 4.19 4.30  HGB 12.0 - 15.0 g/dL 11.7(L) 11.9 12.5  HCT 36 - 46 % 35.8(L) 36.0 37.2  PLT 150 - 400 K/uL 180 232 188.0  NEUTROABS 1.4 - 7.7 K/uL - - 4.2  LYMPHSABS 0.7 - 4.0 K/uL - -  1.9     Body mass index is 28.29 kg/m.  Orders:  Orders Placed This Encounter  Procedures  . Ambulatory referral to Physical Medicine Rehab   No orders of the defined types were placed in this encounter.    Procedures: No procedures performed  Clinical Data: No additional findings.  ROS:  All other systems negative, except as noted in the HPI. Review of Systems  Objective: Vital Signs: Ht 5\' 5"  (1.651 m)   Wt 170 lb (77.1 kg)   BMI 28.29 kg/m   Specialty Comments:  No specialty comments available.  PMFS History: Patient Active Problem List   Diagnosis Date Noted  . H/O ankle fusion 07/18/2019  . Posterior tibial tendinitis, right leg   . Sinusitis 04/10/2018  . Asthma exacerbation 03/28/2018  . SOB (shortness of breath)   . Pyelonephritis 04/15/2016  .  Normochromic normocytic anemia 04/15/2016  . Elevated LFTs 04/15/2016  . HNP (herniated nucleus pulposus), lumbar 07/04/2015    Class: Acute  . Anxiety state 08/08/2013  . INSOMNIA 05/20/2008  . Asthma with bronchitis 05/15/2007  . PLEURISY 05/15/2007  . HYPERVENTILATION 05/15/2007  . COUGH 05/15/2007   Past Medical History:  Diagnosis Date  . Anxiety   . Asthma   . Chronic kidney disease    patient denies - 07/09/2019  . Complication of anesthesia    was slow to wake up with one of her surgeries  . Cough   . Headache    hx. migraines  . Hyperventilation   . Insomnia   . Pleurisy   . UTI (urinary tract infection)     Family History  Problem Relation Age of Onset  . Prostate cancer Father   . Allergic rhinitis Mother   . Breast cancer Mother 20  . Angioedema Neg Hx   . Eczema Neg Hx   . Urticaria Neg Hx   . Asthma Neg Hx     Past Surgical History:  Procedure Laterality Date  . ABDOMINAL HYSTERECTOMY    . ANKLE FUSION Right 07/11/2019   Procedure: RIGHT TALONAVICULAR AND SUBTALAR FUSION;  Surgeon: Newt Minion, MD;  Location: [facility];  Service: Orthopedics;  Laterality: Right;  . LUMBAR LAMINECTOMY N/A 07/04/2015   Procedure: Left L5-S1 MICRODISCECTOMY;  Surgeon: Jessy Oto, MD;  Location: [facility];  Service: Orthopedics;  Laterality: N/A;  . LUMBAR SPINE SURGERY    . UTERINE FIBROID SURGERY    . VESICOVAGINAL FISTULA CLOSURE W/ TAH     Social History   Occupational History  . Occupation: marketing/sales  Tobacco Use  . Smoking status: Never Smoker  . Smokeless tobacco: Never Used  Vaping Use  . Vaping Use: Never used  Substance and Sexual Activity  . Alcohol use: No  . Drug use: No  . Sexual activity: Not on file

## 2019-12-12 ENCOUNTER — Telehealth: Payer: Self-pay | Admitting: Physical Medicine and Rehabilitation

## 2019-12-12 NOTE — Telephone Encounter (Signed)
Pt called stating she missed a call and would still like to get scheduled.   [phone]

## 2019-12-12 NOTE — Telephone Encounter (Signed)
Documented in referral. Pt is scheduled.

## 2019-12-20 ENCOUNTER — Ambulatory Visit (INDEPENDENT_AMBULATORY_CARE_PROVIDER_SITE_OTHER): Payer: Managed Care, Other (non HMO) | Admitting: Physician Assistant

## 2019-12-20 ENCOUNTER — Other Ambulatory Visit: Payer: Self-pay

## 2019-12-20 ENCOUNTER — Encounter: Payer: Self-pay | Admitting: Physician Assistant

## 2019-12-20 VITALS — Ht 65.0 in | Wt 170.0 lb

## 2019-12-20 DIAGNOSIS — M25572 Pain in left ankle and joints of left foot: Secondary | ICD-10-CM

## 2019-12-20 NOTE — Progress Notes (Signed)
Office Visit Note   Patient: [name]           Date of Birth: [birth year]           MRN: [id number] Visit Date: 12/20/2019              Requested by: [name], MD [address] [place],  [place] [address] PCP: [name], MD  Chief Complaint  Patient presents with  . Right Ankle - Follow-up    S/p Right talonavicular and subtalar fusion and CRPS      HPI: This is a pleasant woman who is 5 months status post right talonavicular and subtalar arthrodesis.  She has been having findings consistent with complex regional pain syndrome.  She is scheduled for a sympathetic block.  At her last visit she was immobilized in a cast.  She reports today that she feels that has been very helpful.  Assessment & Plan: Visit Diagnoses: No diagnosis found.  Plan: She will be placed in a new cast but she may weight-bear as tolerated.  Follow-up in 2 weeks.  Follow-Up Instructions: No follow-ups on file.   Ortho Exam  Patient is alert, oriented, no adenopathy, well-dressed, normal affect, normal respiratory effort. Focused examination of her ankle demonstrates well-healed surgical incision she does tolerate me touching her ankle and foot.  No swelling no cellulitis CMS is intact  Imaging: No results found. No images are attached to the encounter.  Labs: Lab Results  Component Value Date   HGBA1C  05/14/2008    5.6 (NOTE)   The ADA recommends the following therapeutic goal for glycemic   control related to Hgb A1C measurement:   Goal of Therapy:   < 7.0% Hgb A1C   Reference: American Diabetes Association: Clinical Practice   Recommendations 2008, Diabetes Care,  2008, 31:(Suppl 1).   ESRSEDRATE 11 03/15/2019   LABURIC 6.4 03/15/2019   REPTSTATUS 04/20/2016 FINAL 04/15/2016   REPTSTATUS 04/20/2016 FINAL 04/15/2016   CULT  04/15/2016    NO GROWTH 5 DAYS Performed at [facility]    CULT  04/15/2016    NO GROWTH 5 DAYS Performed at [facility]    [facility] ESCHERICHIA COLI (A) 04/09/2016     Lab Results  Component Value Date   ALBUMIN 3.2 (L) 04/18/2016   ALBUMIN 3.2 (L) 04/16/2016   ALBUMIN 3.3 (L) 04/15/2016   LABURIC 6.4 03/15/2019    Lab Results  Component Value Date   MG 1.9 04/18/2016   MG 2.0 04/16/2016   MG 2.1 05/16/2008   No results found for: VD25OH  No results found for: PREALBUMIN CBC EXTENDED Latest Ref Rng & Units 07/11/2019 03/15/2019 04/10/2018  WBC 4.0 - 10.5 K/uL 5.2 4.3 6.4  RBC 3.87 - 5.11 MIL/uL 4.04 4.19 4.30  HGB 12.0 - 15.0 g/dL 11.7(L) 11.9 12.5  HCT 36 - 46 % 35.8(L) 36.0 37.2  PLT 150 - 400 K/uL 180 232 188.0  NEUTROABS 1.4 - 7.7 K/uL - - 4.2  LYMPHSABS 0.7 - 4.0 K/uL - - 1.9     Body mass index is 28.29 kg/m.  Orders:  No orders of the defined types were placed in this encounter.  No orders of the defined types were placed in this encounter.    Procedures: No procedures performed  Clinical Data: No additional findings.  ROS:  All other systems negative, except as noted in the HPI. Review of Systems  Objective: Vital Signs: Ht 5\' 5"  (1.651  m)   Wt 170 lb (77.1 kg)   BMI 28.29 kg/m   Specialty Comments:  No specialty comments available.  PMFS History: Patient Active Problem List   Diagnosis Date Noted  . H/O ankle fusion 07/18/2019  . Posterior tibial tendinitis, right leg   . Sinusitis 04/10/2018  . Asthma exacerbation 03/28/2018  . SOB (shortness of breath)   . Pyelonephritis 04/15/2016  . Normochromic normocytic anemia 04/15/2016  . Elevated LFTs 04/15/2016  . HNP (herniated nucleus pulposus), lumbar 07/04/2015    Class: Acute  . Anxiety state 08/08/2013  . INSOMNIA 05/20/2008  . Asthma with bronchitis 05/15/2007  . PLEURISY 05/15/2007  . HYPERVENTILATION 05/15/2007  . COUGH 05/15/2007   Past Medical History:  Diagnosis Date  . Anxiety   . Asthma   . Chronic kidney disease    patient denies - 07/09/2019  . Complication of anesthesia      was slow to wake up with one of her surgeries  . Cough   . Headache    hx. migraines  . Hyperventilation   . Insomnia   . Pleurisy   . UTI (urinary tract infection)     Family History  Problem Relation Age of Onset  . Prostate cancer Father   . Allergic rhinitis Mother   . Breast cancer Mother 81  . Angioedema Neg Hx   . Eczema Neg Hx   . Urticaria Neg Hx   . Asthma Neg Hx     Past Surgical History:  Procedure Laterality Date  . ABDOMINAL HYSTERECTOMY    . ANKLE FUSION Right 07/11/2019   Procedure: RIGHT TALONAVICULAR AND SUBTALAR FUSION;  Surgeon: Newt Minion, MD;  Location: [facility];  Service: Orthopedics;  Laterality: Right;  . LUMBAR LAMINECTOMY N/A 07/04/2015   Procedure: Left L5-S1 MICRODISCECTOMY;  Surgeon: Jessy Oto, MD;  Location: [facility];  Service: Orthopedics;  Laterality: N/A;  . LUMBAR SPINE SURGERY    . UTERINE FIBROID SURGERY    . VESICOVAGINAL FISTULA CLOSURE W/ TAH     Social History   Occupational History  . Occupation: marketing/sales  Tobacco Use  . Smoking status: Never Smoker  . Smokeless tobacco: Never Used  Vaping Use  . Vaping Use: Never used  Substance and Sexual Activity  . Alcohol use: No  . Drug use: No  . Sexual activity: Not on file

## 2019-12-24 ENCOUNTER — Ambulatory Visit: Payer: Self-pay

## 2019-12-24 ENCOUNTER — Ambulatory Visit (INDEPENDENT_AMBULATORY_CARE_PROVIDER_SITE_OTHER): Payer: Managed Care, Other (non HMO) | Admitting: Physical Medicine and Rehabilitation

## 2019-12-24 ENCOUNTER — Telehealth: Payer: Self-pay | Admitting: *Deleted

## 2019-12-24 ENCOUNTER — Other Ambulatory Visit: Payer: Self-pay

## 2019-12-24 ENCOUNTER — Encounter: Payer: Self-pay | Admitting: Physical Medicine and Rehabilitation

## 2019-12-24 VITALS — BP 136/86 | HR 85

## 2019-12-24 DIAGNOSIS — G90521 Complex regional pain syndrome I of right lower limb: Secondary | ICD-10-CM | POA: Diagnosis not present

## 2019-12-24 MED ORDER — BUPIVACAINE HCL 0.5 % IJ SOLN
3.0000 mL | Freq: Once | INTRAMUSCULAR | Status: AC
  Administered 2019-12-24: 3 mL

## 2019-12-24 NOTE — Telephone Encounter (Signed)
[id number], Service Order:[id number] Authorization [id number] Auth Effective Date:06/28/2021Auth End Date:09/26/2021Initiated Date:06/28/2021Decision Date:06/28/2021Decision Type :InitialCase Status:Approved  Pt scheduled 01/10/20

## 2019-12-24 NOTE — Progress Notes (Signed)
Pt states pain in the right foot with some tingling. Pt states pain started about April 2021. Cast on her left foot helps with pain. Standing on right foot makes pain worse.   .Numeric Pain Rating Scale and Functional Assessment Average Pain 9   In the last MONTH (on 0-10 scale) has pain interfered with the following?  1. General activity like being  able to carry out your everyday physical activities such as walking, climbing stairs, carrying groceries, or moving a chair?  Rating(9)   +Driver, -BT, -Dye Allergies.

## 2019-12-25 NOTE — Procedures (Signed)
Lumbar Sympathetic Block with Fluoroscopic Guidance  Patient: [name]      Date of Birth: [birth year] MRN: [id number] PCP: [name], MD      Visit Date: 12/24/2019   Universal Protocol:    Date/Time: 06/29/215:55 AM  Consent Given By: the patient  Position: PRONE  Additional Comments: Vital signs were monitored before and after the procedure. Patient was prepped and draped in the usual sterile fashion. The correct patient, procedure, and site was verified.   Injection Procedure Details:  Procedure Site One Meds Administered:  Meds ordered this encounter  Medications  . bupivacaine (MARCAINE) 0.5 % (with pres) injection 3 mL     Laterality: Right  Location/Site:  L3 vertebral body  Needle size: 22 G  Needle type: spinal  Needle Placement: Lateral vertebral body  Findings:  -Comments: There was excellent flow of contrast ventral to the vertebral body and medially without any flow of contrast intravascularly.  Procedure Details: The fluoroscope beam was manipulated to square off the endplates of the L3 vertebral body to achieve a true midline AP view.  An oblique view of the region overlying the superior  portion of the L3 vertebral body with the ipsilateral tip of the transverse process aligned with the ventral edge of the vertebral body was obtained under fluoroscopic visualization. The target is the outer edge of the upper third of the L3 vertebral body.  For the target described the skin was anesthetized with 1 ml of 1% Lidocaine without epinephrine. The spinal needle was inserted down to the inferior anterolateral aspect of the L3 vertebral body using biplanar imaging.   A 39ml volume of Omnipaque-240 was injected to make sure that the needle tip was not in a blood vessel or intra muscular and a standard fascial plane outline was obtained. Bi-planar imaging confirmed placement and appropriate images documented.  The solution noted above was injected in this  region slowly in 1 ml aliquots.    Additional Comments:  No complications occurred Dressing: Band-Aid    Post-procedure details: Patient was observed during the procedure. Post-procedure instructions were reviewed.  Patient left the clinic in stable condition.

## 2019-12-25 NOTE — Progress Notes (Signed)
[name] - 59 y.o. female MRN [id number]  Date of birth: 03-01-1961  Office Visit Note: Visit Date: 12/24/2019 PCP: [name], MD Referred by: [name], MD  Subjective: No chief complaint on file.  HPI:  [name] is a 59 y.o. female who comes in today At the request of Dr. Aldean Baker for right lumbar sympathetic block do to complex regional pain syndrome of the right lower limb status post surgery of the right ankle.  Right ankle is casted she does have pain and tingling of the right foot.  We will complete sympathetic blocks in a series and see what sort of relief we get.  Depending on relief if it is not very beneficial she would benefit from referral to [facility].  Consideration could be for newer therapies such as ketamine infusion.  ROS Otherwise per HPI.  Assessment & Plan: Visit Diagnoses:  1. Complex regional pain syndrome type 1 of right lower extremity     Plan: No additional findings.   Meds & Orders:  Meds ordered this encounter  Medications  . bupivacaine (MARCAINE) 0.5 % (with pres) injection 3 mL    Orders Placed This Encounter  Procedures  . Nerve Block  . XR C-ARM NO REPORT    Follow-up: Return in about 2 weeks (around 01/07/2020) for Consider repeat.   Procedures: No procedures performed  Lumbar Sympathetic Block with Fluoroscopic Guidance  Patient: [name]      Date of Birth: [birth year] MRN: [id number] PCP: [name], MD      Visit Date: 12/24/2019   Universal Protocol:    Date/Time: 06/29/215:55 AM  Consent Given By: the patient  Position: PRONE  Additional Comments: Vital signs were monitored before and after the procedure. Patient was prepped and draped in the usual sterile fashion. The correct patient, procedure, and site was verified.   Injection Procedure Details:  Procedure Site One Meds Administered:  Meds ordered this encounter  Medications  . bupivacaine (MARCAINE)  0.5 % (with pres) injection 3 mL     Laterality: Right  Location/Site:  L3 vertebral body  Needle size: 22 G  Needle type: spinal  Needle Placement: Lateral vertebral body  Findings:  -Comments: There was excellent flow of contrast ventral to the vertebral body and medially without any flow of contrast intravascularly.  Procedure Details: The fluoroscope beam was manipulated to square off the endplates of the L3 vertebral body to achieve a true midline AP view.  An oblique view of the region overlying the superior  portion of the L3 vertebral body with the ipsilateral tip of the transverse process aligned with the ventral edge of the vertebral body was obtained under fluoroscopic visualization. The target is the outer edge of the upper third of the L3 vertebral body.  For the target described the skin was anesthetized with 1 ml of 1% Lidocaine without epinephrine. The spinal needle was inserted down to the inferior anterolateral aspect of the L3 vertebral body using biplanar imaging.   A 107ml volume of Omnipaque-240 was injected to make sure that the needle tip was not in a blood vessel or intra muscular and a standard fascial plane outline was obtained. Bi-planar imaging confirmed placement and appropriate images documented.  The solution noted above was injected in this region slowly in 1 ml aliquots.    Additional Comments:  No complications occurred Dressing: Band-Aid    Post-procedure details: Patient was observed during the procedure. Post-procedure instructions  were reviewed.  Patient left the clinic in stable condition.    Clinical History: No specialty comments available.     Objective:  VS:  HT:    WT:   BMI:     BP:136/86  HR:85bpm  TEMP: ( )  RESP:  Physical Exam Constitutional:      General: She is not in acute distress.    Appearance: Normal appearance. She is not ill-appearing.  HENT:     Head: Normocephalic and atraumatic.     Right Ear: External  ear normal.     Left Ear: External ear normal.  Eyes:     Extraocular Movements: Extraocular movements intact.  Cardiovascular:     Rate and Rhythm: Normal rate.     Pulses: Normal pulses.  Musculoskeletal:     Comments: Patient sitting in wheelchair today can stand with some pain.  She has right-sided lower extremity cast.  Skin:    Findings: No erythema, lesion or rash.  Neurological:     General: No focal deficit present.     Mental Status: She is alert and oriented to person, place, and time.     Sensory: No sensory deficit.     Motor: No weakness or abnormal muscle tone.     Coordination: Coordination normal.  Psychiatric:        Mood and Affect: Mood normal.        Behavior: Behavior normal.      Imaging: XR C-ARM NO REPORT  Result Date: 12/24/2019 Please see Notes tab for imaging impression.

## 2019-12-28 ENCOUNTER — Other Ambulatory Visit (HOSPITAL_COMMUNITY): Payer: Self-pay | Admitting: *Deleted

## 2019-12-28 DIAGNOSIS — J9859 Other diseases of mediastinum, not elsewhere classified: Secondary | ICD-10-CM

## 2019-12-28 DIAGNOSIS — R222 Localized swelling, mass and lump, trunk: Secondary | ICD-10-CM

## 2020-01-04 ENCOUNTER — Ambulatory Visit (INDEPENDENT_AMBULATORY_CARE_PROVIDER_SITE_OTHER): Payer: Managed Care, Other (non HMO) | Admitting: Physician Assistant

## 2020-01-04 ENCOUNTER — Encounter: Payer: Self-pay | Admitting: Physician Assistant

## 2020-01-04 ENCOUNTER — Other Ambulatory Visit: Payer: Self-pay

## 2020-01-04 VITALS — Ht 65.0 in | Wt 170.0 lb

## 2020-01-04 DIAGNOSIS — M76821 Posterior tibial tendinitis, right leg: Secondary | ICD-10-CM

## 2020-01-04 NOTE — Progress Notes (Signed)
Office Visit Note   Patient: [name]           Date of Birth: [birth year]           MRN: [id number] Visit Date: 01/04/2020              Requested by: [name], MD [address] [place],  [place] [address] PCP: [name], MD  Chief Complaint  Patient presents with  . Right Ankle - Routine Post Op    S/p Right talonavicular and subtalar fusion and CRPS        HPI: This is a pleasant 59 year old woman who is now 5 months status post talonavicular and subtalar arthrodesis.  She has had difficulty with complex regional pain syndrome.  She had failed oral medication including Neurontin Elavil and Cymbalta.  She received her first sympathetic nerve block by Dr. Alvester Morin last week.  She is scheduled to receive another one next week.  She thinks maybe it is helping.  She has been immobilized in a cast as it has helped her pain  Assessment & Plan: Visit Diagnoses: No diagnosis found.  Plan: Patient will go back into a boot and try weightbearing.  She is just to do this gradually and also just work on gentle desensitization.  She is having her second block next week and should follow-up with [place] in about 3 weeks  Follow-Up Instructions: No follow-ups on file.   Ortho Exam  Patient is alert, oriented, no adenopathy, well-dressed, normal affect, normal respiratory effort. Focused examination of her foot strong pulses she tolerates palpation over the areas of effusions.  No soft tissue swelling compartments are soft and nontender no evidence of any infective process  Imaging: No results found. No images are attached to the encounter.  Labs: Lab Results  Component Value Date   HGBA1C  05/14/2008    5.6 (NOTE)   The ADA recommends the following therapeutic goal for glycemic   control related to Hgb A1C measurement:   Goal of Therapy:   < 7.0% Hgb A1C   Reference: American Diabetes Association: Clinical Practice   Recommendations 2008, Diabetes Care,    2008, 31:(Suppl 1).   ESRSEDRATE 11 03/15/2019   LABURIC 6.4 03/15/2019   REPTSTATUS 04/20/2016 FINAL 04/15/2016   REPTSTATUS 04/20/2016 FINAL 04/15/2016   CULT  04/15/2016    NO GROWTH 5 DAYS Performed at [facility]    CULT  04/15/2016    NO GROWTH 5 DAYS Performed at [facility]    [facility] ESCHERICHIA COLI (A) 04/09/2016     Lab Results  Component Value Date   ALBUMIN 3.2 (L) 04/18/2016   ALBUMIN 3.2 (L) 04/16/2016   ALBUMIN 3.3 (L) 04/15/2016   LABURIC 6.4 03/15/2019    Lab Results  Component Value Date   MG 1.9 04/18/2016   MG 2.0 04/16/2016   MG 2.1 05/16/2008   No results found for: VD25OH  No results found for: PREALBUMIN CBC EXTENDED Latest Ref Rng & Units 07/11/2019 03/15/2019 04/10/2018  WBC 4.0 - 10.5 K/uL 5.2 4.3 6.4  RBC 3.87 - 5.11 MIL/uL 4.04 4.19 4.30  HGB 12.0 - 15.0 g/dL 11.7(L) 11.9 12.5  HCT 36 - 46 % 35.8(L) 36.0 37.2  PLT 150 - 400 K/uL 180 232 188.0  NEUTROABS 1.4 - 7.7 K/uL - - 4.2  LYMPHSABS 0.7 - 4.0 K/uL - - 1.9     Body mass index is 28.29 kg/m.  Orders:  No orders of  the defined types were placed in this encounter.  No orders of the defined types were placed in this encounter.    Procedures: No procedures performed  Clinical Data: No additional findings.  ROS:  All other systems negative, except as noted in the HPI. Review of Systems  Objective: Vital Signs: Ht 5\' 5"  (1.651 m)   Wt 170 lb (77.1 kg)   BMI 28.29 kg/m   Specialty Comments:  No specialty comments available.  PMFS History: Patient Active Problem List   Diagnosis Date Noted  . H/O ankle fusion 07/18/2019  . Posterior tibial tendinitis, right leg   . Sinusitis 04/10/2018  . Asthma exacerbation 03/28/2018  . SOB (shortness of breath)   . Pyelonephritis 04/15/2016  . Normochromic normocytic anemia 04/15/2016  . Elevated LFTs 04/15/2016  . HNP (herniated nucleus pulposus), lumbar 07/04/2015    Class: Acute  . Anxiety state  08/08/2013  . INSOMNIA 05/20/2008  . Asthma with bronchitis 05/15/2007  . PLEURISY 05/15/2007  . HYPERVENTILATION 05/15/2007  . COUGH 05/15/2007   Past Medical History:  Diagnosis Date  . Anxiety   . Asthma   . Chronic kidney disease    patient denies - 07/09/2019  . Complication of anesthesia    was slow to wake up with one of her surgeries  . Cough   . Headache    hx. migraines  . Hyperventilation   . Insomnia   . Pleurisy   . UTI (urinary tract infection)     Family History  Problem Relation Age of Onset  . Prostate cancer Father   . Allergic rhinitis Mother   . Breast cancer Mother 92  . Angioedema Neg Hx   . Eczema Neg Hx   . Urticaria Neg Hx   . Asthma Neg Hx     Past Surgical History:  Procedure Laterality Date  . ABDOMINAL HYSTERECTOMY    . ANKLE FUSION Right 07/11/2019   Procedure: RIGHT TALONAVICULAR AND SUBTALAR FUSION;  Surgeon: 07/13/2019, MD;  [facility];  Service: Orthopedics;  Laterality: Right;  . LUMBAR LAMINECTOMY N/A 07/04/2015   Procedure: Left L5-S1 MICRODISCECTOMY;  Surgeon: 09/01/2015, MD;  Location: [facility] OR;  Service: Orthopedics;  Laterality: N/A;  . LUMBAR SPINE SURGERY    . UTERINE FIBROID SURGERY    . VESICOVAGINAL FISTULA CLOSURE W/ TAH     Social History   Occupational History  . Occupation: marketing/sales  Tobacco Use  . Smoking status: Never Smoker  . Smokeless tobacco: Never Used  Vaping Use  . Vaping Use: Never used  Substance and Sexual Activity  . Alcohol use: No  . Drug use: No  . Sexual activity: Not on file

## 2020-01-10 ENCOUNTER — Other Ambulatory Visit: Payer: Self-pay

## 2020-01-10 ENCOUNTER — Ambulatory Visit: Payer: Self-pay

## 2020-01-10 ENCOUNTER — Ambulatory Visit: Payer: Managed Care, Other (non HMO) | Admitting: Physical Medicine and Rehabilitation

## 2020-01-10 VITALS — BP 136/87 | HR 91

## 2020-01-10 DIAGNOSIS — G90521 Complex regional pain syndrome I of right lower limb: Secondary | ICD-10-CM | POA: Diagnosis not present

## 2020-01-10 MED ORDER — BUPIVACAINE HCL 0.25 % IJ SOLN: 10.0000 mL | Freq: Once | INTRAMUSCULAR | Status: AC

## 2020-01-10 NOTE — Progress Notes (Signed)
Pt states the the shot helped sum but she had a couple of bad days.  Numeric Pain Rating Scale and Functional Assessment Average Pain 6   In the last MONTH (on 0-10 scale) has pain interfered with the following?  1. General activity like being  able to carry out your everyday physical activities such as walking, climbing stairs, carrying groceries, or moving a chair?  Rating(10)   +Driver, -BT, -Dye Allergies.

## 2020-01-11 ENCOUNTER — Telehealth: Payer: Self-pay | Admitting: Physical Medicine and Rehabilitation

## 2020-01-11 ENCOUNTER — Encounter: Payer: Self-pay | Admitting: Physical Medicine and Rehabilitation

## 2020-01-11 NOTE — Telephone Encounter (Signed)
Needs auth for [address]. Patient is scheduled for 8/5.

## 2020-01-14 ENCOUNTER — Encounter: Payer: Self-pay | Admitting: Physical Medicine and Rehabilitation

## 2020-01-14 ENCOUNTER — Telehealth: Payer: Self-pay | Admitting: Physical Medicine and Rehabilitation

## 2020-01-14 NOTE — Progress Notes (Signed)
[name] - 59 y.o. female MRN [id number]  Date of birth: 1960/08/02  Office Visit Note: Visit Date: 01/10/2020 PCP: [name], MD Referred by: [name], MD  Subjective: Chief Complaint  Patient presents with  . Right Foot - Pain   HPI:  [name] is a 59 y.o. female who comes in today For second in a series of lumbar sympathetic blocks for complex regional pain syndrome on the right status post foot and ankle surgery by Dr. Aldean Baker.  Patient comes in today at his request.  Prior sympathetic block gave her some brief relief.  She has had follow-up with him and we are proceeding with another injection.  Depending on where she has with this I would suggest referral to [facility] in [place] for alternative treatments including treatment with ketamine or even spinal cord stimulator.  ROS Otherwise per HPI.  Assessment & Plan: Visit Diagnoses:  1. Complex regional pain syndrome type 1 of right lower extremity     Plan: No additional findings.   Meds & Orders:  Meds ordered this encounter  Medications  . bupivacaine (MARCAINE) 0.25 % (with pres) injection 10 mL    Orders Placed This Encounter  Procedures  . Nerve Block  . XR C-ARM NO REPORT    Follow-up: Return for  repeat injection in 2 to 3 weeks.   Procedures: No procedures performed  Lumbar Sympathetic Block with Fluoroscopic Guidance  Patient: [name]      Date of Birth: Nov 15, 1960 MRN: [id number] PCP: [name], MD      Visit Date: 01/10/2020   Universal Protocol:    Date/Time: 07/19/216:28 AM  Consent Given By: the patient  Position: PRONE  Additional Comments: Vital signs were monitored before and after the procedure. Patient was prepped and draped in the usual sterile fashion. The correct patient, procedure, and site was verified.   Injection Procedure Details:  Procedure Site One Meds Administered:  Meds ordered this encounter  Medications  .  bupivacaine (MARCAINE) 0.25 % (with pres) injection 10 mL     Laterality: Right  Location/Site:  L3 vertebral body  Needle size: 22 G  Needle type: spinal  Needle Placement: Lateral vertebral body  Findings:  -Comments: Excellent pattern.  Flow intravascularly and no muscular  Procedure Details: The fluoroscope beam was manipulated to square off the endplates of the L3 vertebral body to achieve a true midline AP view.  An oblique view of the region overlying the superior  portion of the L3 vertebral body with the ipsilateral tip of the transverse process aligned with the ventral edge of the vertebral body was obtained under fluoroscopic visualization. The target is the outer edge of the upper third of the L3 vertebral body.  For the target described the skin was anesthetized with 1 ml of 1% Lidocaine without epinephrine. The spinal needle was inserted down to the inferior anterolateral aspect of the L3 vertebral body using biplanar imaging.   A 54ml volume of Omnipaque-240 was injected to make sure that the needle tip was not in a blood vessel or intra muscular and a standard fascial plane outline was obtained. Bi-planar imaging confirmed placement and appropriate images documented.  The solution noted above was injected in this region slowly in 1 ml aliquots.    Additional Comments:  The patient tolerated the procedure well Dressing: Band-Aid    Post-procedure details: Patient was observed during the procedure. Post-procedure instructions were reviewed.  Patient left the clinic  in stable condition.    Clinical History: No specialty comments available.     Objective:  VS:  HT:    WT:   BMI:     BP:136/87  HR:91bpm  TEMP: ( )  RESP:  Physical Exam Vitals and nursing note reviewed.  Constitutional:      General: She is not in acute distress.    Appearance: Normal appearance. She is well-developed. She is not ill-appearing.  HENT:     Head: Normocephalic and  atraumatic.  Eyes:     Conjunctiva/sclera: Conjunctivae normal.     Pupils: Pupils are equal, round, and reactive to light.  Cardiovascular:     Rate and Rhythm: Normal rate.     Pulses: Normal pulses.  Pulmonary:     Effort: Pulmonary effort is normal.  Musculoskeletal:     Comments: Cast on the right lower extremity.  Skin:    General: Skin is warm and dry.     Findings: No erythema or rash.  Neurological:     General: No focal deficit present.     Mental Status: She is alert and oriented to person, place, and time.     Motor: No abnormal muscle tone.     Gait: Gait abnormal.  Psychiatric:        Mood and Affect: Mood normal.        Behavior: Behavior normal.      Imaging: No results found.

## 2020-01-14 NOTE — Telephone Encounter (Signed)
This message is for [name]. Patient called requesting a revised  doctor's note. Patient stated that note needs to say she was excused from work  for the reason of rest and back pains after procedure on January 11, 2020. Patient asked that [name] removed due to headache part of the note. Patient phone number is [phone].

## 2020-01-14 NOTE — Telephone Encounter (Signed)
Pt states she was out of work on 01/11/20 due to getting an injection and would like a work note emailed to her @ [email or web address].

## 2020-01-14 NOTE — Procedures (Signed)
Lumbar Sympathetic Block with Fluoroscopic Guidance  Patient: [name]      Date of Birth: 1961/06/24 MRN: [id number] PCP: [name], MD      Visit Date: 01/10/2020   Universal Protocol:    Date/Time: 07/19/216:28 AM  Consent Given By: the patient  Position: PRONE  Additional Comments: Vital signs were monitored before and after the procedure. Patient was prepped and draped in the usual sterile fashion. The correct patient, procedure, and site was verified.   Injection Procedure Details:  Procedure Site One Meds Administered:  Meds ordered this encounter  Medications  . bupivacaine (MARCAINE) 0.25 % (with pres) injection 10 mL     Laterality: Right  Location/Site:  L3 vertebral body  Needle size: 22 G  Needle type: spinal  Needle Placement: Lateral vertebral body  Findings:  -Comments: Excellent pattern.  Flow intravascularly and no muscular  Procedure Details: The fluoroscope beam was manipulated to square off the endplates of the L3 vertebral body to achieve a true midline AP view.  An oblique view of the region overlying the superior  portion of the L3 vertebral body with the ipsilateral tip of the transverse process aligned with the ventral edge of the vertebral body was obtained under fluoroscopic visualization. The target is the outer edge of the upper third of the L3 vertebral body.  For the target described the skin was anesthetized with 1 ml of 1% Lidocaine without epinephrine. The spinal needle was inserted down to the inferior anterolateral aspect of the L3 vertebral body using biplanar imaging.   A 52ml volume of Omnipaque-240 was injected to make sure that the needle tip was not in a blood vessel or intra muscular and a standard fascial plane outline was obtained. Bi-planar imaging confirmed placement and appropriate images documented.  The solution noted above was injected in this region slowly in 1 ml aliquots.    Additional Comments:  The  patient tolerated the procedure well Dressing: Band-Aid    Post-procedure details: Patient was observed during the procedure. Post-procedure instructions were reviewed.  Patient left the clinic in stable condition.

## 2020-01-14 NOTE — Telephone Encounter (Signed)
Called patient and sent work note through [employer].

## 2020-01-15 ENCOUNTER — Encounter: Payer: Self-pay | Admitting: Physical Medicine and Rehabilitation

## 2020-01-15 ENCOUNTER — Other Ambulatory Visit: Payer: Self-pay | Admitting: Physical Medicine and Rehabilitation

## 2020-01-15 DIAGNOSIS — G90521 Complex regional pain syndrome I of right lower limb: Secondary | ICD-10-CM

## 2020-01-15 NOTE — Telephone Encounter (Signed)
See mychart messages

## 2020-01-17 ENCOUNTER — Other Ambulatory Visit: Payer: Self-pay | Admitting: Obstetrics and Gynecology

## 2020-01-17 DIAGNOSIS — Z1231 Encounter for screening mammogram for malignant neoplasm of breast: Secondary | ICD-10-CM

## 2020-01-23 ENCOUNTER — Ambulatory Visit
Admission: RE | Admit: 2020-01-23 | Discharge: 2020-01-23 | Disposition: A | Discharge status: Home / Self Care | Payer: Managed Care, Other (non HMO) | Source: Ambulatory Visit | Attending: Obstetrics and Gynecology | Admitting: Obstetrics and Gynecology

## 2020-01-23 ENCOUNTER — Other Ambulatory Visit: Payer: Self-pay

## 2020-01-23 DIAGNOSIS — Z1231 Encounter for screening mammogram for malignant neoplasm of breast: Secondary | ICD-10-CM

## 2020-01-25 ENCOUNTER — Ambulatory Visit (INDEPENDENT_AMBULATORY_CARE_PROVIDER_SITE_OTHER): Payer: Managed Care, Other (non HMO) | Admitting: Physician Assistant

## 2020-01-25 ENCOUNTER — Encounter: Payer: Self-pay | Admitting: Physician Assistant

## 2020-01-25 VITALS — Ht 65.0 in | Wt 170.0 lb

## 2020-01-25 DIAGNOSIS — Z981 Arthrodesis status: Secondary | ICD-10-CM

## 2020-01-25 MED ORDER — ZOLPIDEM TARTRATE ER 6.25 MG PO TBCR
6.2500 mg | EXTENDED_RELEASE_TABLET | Freq: Every evening | ORAL | 0 refills | Status: DC | PRN
Start: 2020-01-25 — End: 2020-03-11

## 2020-01-25 MED ORDER — TRAMADOL HCL 50 MG PO TABS: 100.0000 mg | ORAL_TABLET | Freq: Four times a day (QID) | ORAL | 0 refills | Status: DC | PRN

## 2020-01-25 NOTE — Progress Notes (Signed)
Office Visit Note   Patient: [name]           Date of Birth: 10-19-1960           MRN: [id number] Visit Date: 01/25/2020              Requested by: [name], MD [address] [place],  [place] [address] PCP: [name], MD  Chief Complaint  Patient presents with  . Right Foot - Follow-up    07/29/19 right talonavicular and subtalar fusion with CRPS       HPI: This is a pleasant 59 year old woman who continues to struggle with regional pain syndrome after a right talo navicular and subtalar fusion in January. Most recently she has had nerve blocks x2 with Dr. Alvester Morin. She thought the first 1 helped her a little bit. She said the second 1 did not help her and following that she began having significant radicular symptoms running down her left leg. She is currently waiting on an MRI of her spine. It was mentioned to her the possibility of ketamine infusions. She does not know much about this but was told asked today. She is using her cam boot but still cannot put weight on her foot. Between her radiculopathy and her foot she is having significant difficulty sleeping. She has taken Ambien in the past which seems to have helped  Assessment & Plan: Visit Diagnoses: No diagnosis found.  Plan: I will review recommendations with Dr. Lajoyce Corners next week. I will give her some Ambien to use when she cannot sleep. She is not to take this at the same time as her tramadol.  Follow-Up Instructions: No follow-ups on file.   Ortho Exam  Patient is alert, oriented, no adenopathy, well-dressed, normal affect, normal respiratory effort. Focused examination of her right ankle mild to moderate soft tissue swelling no cellulitis she does allow me to touch her foot and bend her ankle. She resists weightbearing. Compartments are soft and nontender no evidence of an infective process  Imaging: No results found. No images are attached to the encounter.  Labs: Lab Results    Component Value Date   HGBA1C  05/14/2008    5.6 (NOTE)   The ADA recommends the following therapeutic goal for glycemic   control related to Hgb A1C measurement:   Goal of Therapy:   < 7.0% Hgb A1C   Reference: American Diabetes Association: Clinical Practice   Recommendations 2008, Diabetes Care,  2008, 31:(Suppl 1).   ESRSEDRATE 11 03/15/2019   LABURIC 6.4 03/15/2019   REPTSTATUS 04/20/2016 FINAL 04/15/2016   REPTSTATUS 04/20/2016 FINAL 04/15/2016   CULT  04/15/2016    NO GROWTH 5 DAYS Performed at [facility]    CULT  04/15/2016    NO GROWTH 5 DAYS Performed at [facility]    [facility] ESCHERICHIA COLI (A) 04/09/2016     Lab Results  Component Value Date   ALBUMIN 3.2 (L) 04/18/2016   ALBUMIN 3.2 (L) 04/16/2016   ALBUMIN 3.3 (L) 04/15/2016   LABURIC 6.4 03/15/2019    Lab Results  Component Value Date   MG 1.9 04/18/2016   MG 2.0 04/16/2016   MG 2.1 05/16/2008   No results found for: VD25OH  No results found for: PREALBUMIN CBC EXTENDED Latest Ref Rng & Units 07/11/2019 03/15/2019 04/10/2018  WBC 4.0 - 10.5 K/uL 5.2 4.3 6.4  RBC 3.87 - 5.11 MIL/uL 4.04 4.19 4.30  HGB 12.0 - 15.0 g/dL 11.7(L) 11.9  12.5  HCT 36 - 46 % 35.8(L) 36.0 37.2  PLT 150 - 400 K/uL 180 232 188.0  NEUTROABS 1.4 - 7.7 K/uL - - 4.2  LYMPHSABS 0.7 - 4.0 K/uL - - 1.9     Body mass index is 28.29 kg/m.  Orders:  No orders of the defined types were placed in this encounter.  Meds ordered this encounter  Medications  . traMADol (ULTRAM) 50 MG tablet    Sig: Take 2 tablets (100 mg total) by mouth every 6 (six) hours as needed for moderate pain.    Dispense:  30 tablet    Refill:  0  . zolpidem (AMBIEN CR) 6.25 MG CR tablet    Sig: Take 1 tablet (6.25 mg total) by mouth at bedtime as needed for sleep.    Dispense:  30 tablet    Refill:  0     Procedures: No procedures performed  Clinical Data: No additional findings.  ROS:  All other systems negative, except as  noted in the HPI. Review of Systems  Objective: Vital Signs: Ht 5\' 5"  (1.651 m)   Wt 170 lb (77.1 kg)   BMI 28.29 kg/m   Specialty Comments:  No specialty comments available.  PMFS History: Patient Active Problem List   Diagnosis Date Noted  . H/O ankle fusion 07/18/2019  . Posterior tibial tendinitis, right leg   . Sinusitis 04/10/2018  . Asthma exacerbation 03/28/2018  . SOB (shortness of breath)   . Pyelonephritis 04/15/2016  . Normochromic normocytic anemia 04/15/2016  . Elevated LFTs 04/15/2016  . HNP (herniated nucleus pulposus), lumbar 07/04/2015    Class: Acute  . Anxiety state 08/08/2013  . INSOMNIA 05/20/2008  . Asthma with bronchitis 05/15/2007  . PLEURISY 05/15/2007  . HYPERVENTILATION 05/15/2007  . COUGH 05/15/2007   Past Medical History:  Diagnosis Date  . Anxiety   . Asthma   . Chronic kidney disease    patient denies - 07/09/2019  . Complication of anesthesia    was slow to wake up with one of her surgeries  . Cough   . Headache    hx. migraines  . Hyperventilation   . Insomnia   . Pleurisy   . UTI (urinary tract infection)     Family History  Problem Relation Age of Onset  . Prostate cancer Father   . Allergic rhinitis Mother   . Breast cancer Mother 70  . Angioedema Neg Hx   . Eczema Neg Hx   . Urticaria Neg Hx   . Asthma Neg Hx     Past Surgical History:  Procedure Laterality Date  . ABDOMINAL HYSTERECTOMY    . ANKLE FUSION Right 07/11/2019   Procedure: RIGHT TALONAVICULAR AND SUBTALAR FUSION;  Surgeon: 07/13/2019, MD;  Location: [facility] OR;  Service: Orthopedics;  Laterality: Right;  . LUMBAR LAMINECTOMY N/A 07/04/2015   Procedure: Left L5-S1 MICRODISCECTOMY;  Surgeon: 09/01/2015, MD;  Location: [facility] OR;  Service: Orthopedics;  Laterality: N/A;  . LUMBAR SPINE SURGERY    . UTERINE FIBROID SURGERY    . VESICOVAGINAL FISTULA CLOSURE W/ TAH     Social History   Occupational History  . Occupation: marketing/sales  Tobacco Use  .  Smoking status: Never Smoker  . Smokeless tobacco: Never Used  Vaping Use  . Vaping Use: Never used  Substance and Sexual Activity  . Alcohol use: No  . Drug use: No  . Sexual activity: Not on file

## 2020-01-31 ENCOUNTER — Encounter: Payer: Managed Care, Other (non HMO) | Admitting: Physical Medicine and Rehabilitation

## 2020-02-01 ENCOUNTER — Other Ambulatory Visit: Payer: Managed Care, Other (non HMO)

## 2020-02-08 ENCOUNTER — Ambulatory Visit (INDEPENDENT_AMBULATORY_CARE_PROVIDER_SITE_OTHER): Payer: Managed Care, Other (non HMO) | Admitting: Family Medicine

## 2020-02-08 ENCOUNTER — Encounter: Payer: Self-pay | Admitting: Family Medicine

## 2020-02-08 ENCOUNTER — Ambulatory Visit: Payer: Self-pay

## 2020-02-08 ENCOUNTER — Ambulatory Visit: Payer: Managed Care, Other (non HMO) | Admitting: Family Medicine

## 2020-02-08 ENCOUNTER — Other Ambulatory Visit: Payer: Self-pay

## 2020-02-08 DIAGNOSIS — M25572 Pain in left ankle and joints of left foot: Secondary | ICD-10-CM

## 2020-02-08 MED ORDER — TRAMADOL HCL 50 MG PO TABS: 100.0000 mg | ORAL_TABLET | Freq: Four times a day (QID) | ORAL | 0 refills | Status: DC | PRN

## 2020-02-08 NOTE — Progress Notes (Signed)
Office Visit Note   Patient: [name]           Date of Birth: 1960/12/28           MRN: [id number] Visit Date: 02/08/2020 Requested by: [name], MD [address] [place],  [place] [address] PCP: [name], MD  Subjective: Chief Complaint  Patient presents with  . Left Foot - Pain    HPI: She is here with left heel pain.  Chronic pain, last December I injected her tibialis posterior tendon with some improvement for a few months.  Most of her pain seems to be on the plantar aspect of her heel with weightbearing.  She is still struggling with chronic pain in the right leg, getting ready to go for ketamine infusion for pain control.              ROS:   all other systems were reviewed and are negative.  Objective: Vital Signs: There were no vitals taken for this visit.  Physical Exam:  General:  Alert and oriented, in no acute distress. Pulm:  Breathing unlabored. Psy:  Normal mood, congruent affect. Skin: No erythema Left foot: She has slight tenderness near the tibialis posterior tendon but most of her pain is on the plantar aspect of her heel.  Tender at the origin of the plantar fascia.  Imaging: No results found.  Assessment & Plan: 1.  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  Refill tramadol.  Follow-up as needed.     Procedures: Left plantar fascia injection: After sterile prep with Betadine, injected 3 cc 1% lidocaine without epinephrine and 40 mg methylprednisolone into the area of maximum tenderness at the plantar fascial origin.   PMFS History: Patient Active Problem List   Diagnosis Date Noted  . H/O ankle fusion 07/18/2019  . Posterior tibial tendinitis, right leg   . Sinusitis 04/10/2018  . Asthma exacerbation 03/28/2018  . SOB (shortness of breath)   . Pyelonephritis 04/15/2016  . Normochromic normocytic anemia 04/15/2016  . Elevated LFTs 04/15/2016  . HNP (herniated nucleus  pulposus), lumbar 07/04/2015    Class: Acute  . Anxiety state 08/08/2013  . INSOMNIA 05/20/2008  . Asthma with bronchitis 05/15/2007  . PLEURISY 05/15/2007  . HYPERVENTILATION 05/15/2007  . COUGH 05/15/2007   Past Medical History:  Diagnosis Date  . Anxiety   . Asthma   . Chronic kidney disease    patient denies - 07/09/2019  . Complication of anesthesia    was slow to wake up with one of her surgeries  . Cough   . Headache    hx. migraines  . Hyperventilation   . Insomnia   . Pleurisy   . UTI (urinary tract infection)     Family History  Problem Relation Age of Onset  . Prostate cancer Father   . Allergic rhinitis Mother   . Breast cancer Mother 12  . Angioedema Neg Hx   . Eczema Neg Hx   . Urticaria Neg Hx   . Asthma Neg Hx     Past Surgical History:  Procedure Laterality Date  . ABDOMINAL HYSTERECTOMY    . ANKLE FUSION Right 07/11/2019   Procedure: RIGHT TALONAVICULAR AND SUBTALAR FUSION;  Surgeon: Nadara Mustard, MD;  Location: [facility] OR;  Service: Orthopedics;  Laterality: Right;  . LUMBAR LAMINECTOMY N/A 07/04/2015   Procedure: Left L5-S1 MICRODISCECTOMY;  Surgeon: Kerrin Champagne, MD;  Location: [facility] OR;  Service:  Orthopedics;  Laterality: N/A;  . LUMBAR SPINE SURGERY    . UTERINE FIBROID SURGERY    . VESICOVAGINAL FISTULA CLOSURE W/ TAH     Social History   Occupational History  . Occupation: marketing/sales  Tobacco Use  . Smoking status: Never Smoker  . Smokeless tobacco: Never Used  Vaping Use  . Vaping Use: Never used  Substance and Sexual Activity  . Alcohol use: No  . Drug use: No  . Sexual activity: Not on file

## 2020-02-09 ENCOUNTER — Other Ambulatory Visit: Payer: Managed Care, Other (non HMO)

## 2020-02-15 ENCOUNTER — Ambulatory Visit (INDEPENDENT_AMBULATORY_CARE_PROVIDER_SITE_OTHER): Payer: Managed Care, Other (non HMO) | Admitting: Physician Assistant

## 2020-02-15 ENCOUNTER — Encounter: Payer: Self-pay | Admitting: Physician Assistant

## 2020-02-15 VITALS — Ht 65.0 in | Wt 170.0 lb

## 2020-02-15 DIAGNOSIS — M25571 Pain in right ankle and joints of right foot: Secondary | ICD-10-CM

## 2020-02-15 NOTE — Progress Notes (Signed)
Office Visit Note   Patient: [name]           Date of Birth: 06/05/1961           MRN: [id number] Visit Date: 02/15/2020              Requested by: [name], MD [address] [place],  [place] [address] PCP: [name], MD  Chief Complaint  Patient presents with  . Right Ankle - Follow-up    07/29/19 talonavicular and subtalar fusion with CRPS      HPI: This is a pleasant 59 year old woman who is status post right talonavicular subtalar fusion.  She has struggled with pain in the right ankle.  She has tried different types of immobilization including casting in a boot.  She did try to nerve blocks and they have not been very successful.  She is even discussed doing ketamine infusions.  But today she feels a little bit better.  She does say that at work she actually takes her boot off and walks with her crutches.  Assessment & Plan: Visit Diagnoses: No diagnosis found.  Plan: I think she has had some significant improvement since her last visit.  I told her our knowledge of the ketamine infusions was somewhat limited but certainly she could look into this.  Follow-up in 1 month  Follow-Up Instructions: No follow-ups on file.   Ortho Exam  Patient is alert, oriented, no adenopathy, well-dressed, normal affect, normal respiratory effort. Focused examination well-healed surgical incision no soft tissue swelling tolerates me manipulating and touching her foot and ankle.  Compartments and soft and compressible no signs of cellulitis or infection  Imaging: No results found. No images are attached to the encounter.  Labs: Lab Results  Component Value Date   HGBA1C  05/14/2008    5.6 (NOTE)   The ADA recommends the following therapeutic goal for glycemic   control related to Hgb A1C measurement:   Goal of Therapy:   < 7.0% Hgb A1C   Reference: American Diabetes Association: Clinical Practice   Recommendations 2008, Diabetes Care,  2008, 31:(Suppl  1).   ESRSEDRATE 11 03/15/2019   LABURIC 6.4 03/15/2019   REPTSTATUS 04/20/2016 FINAL 04/15/2016   REPTSTATUS 04/20/2016 FINAL 04/15/2016   CULT  04/15/2016    NO GROWTH 5 DAYS Performed at [facility]    CULT  04/15/2016    NO GROWTH 5 DAYS Performed at [facility]    [facility] ESCHERICHIA COLI (A) 04/09/2016     Lab Results  Component Value Date   ALBUMIN 3.2 (L) 04/18/2016   ALBUMIN 3.2 (L) 04/16/2016   ALBUMIN 3.3 (L) 04/15/2016   LABURIC 6.4 03/15/2019    Lab Results  Component Value Date   MG 1.9 04/18/2016   MG 2.0 04/16/2016   MG 2.1 05/16/2008   No results found for: VD25OH  No results found for: PREALBUMIN CBC EXTENDED Latest Ref Rng & Units 07/11/2019 03/15/2019 04/10/2018  WBC 4.0 - 10.5 K/uL 5.2 4.3 6.4  RBC 3.87 - 5.11 MIL/uL 4.04 4.19 4.30  HGB 12.0 - 15.0 g/dL 11.7(L) 11.9 12.5  HCT 36 - 46 % 35.8(L) 36.0 37.2  PLT 150 - 400 K/uL 180 232 188.0  NEUTROABS 1.4 - 7.7 K/uL - - 4.2  LYMPHSABS 0.7 - 4.0 K/uL - - 1.9     Body mass index is 28.29 kg/m.  Orders:  No orders of the defined types were placed in this encounter.  No orders of the defined types were placed in this encounter.    Procedures: No procedures performed  Clinical Data: No additional findings.  ROS:  All other systems negative, except as noted in the HPI. Review of Systems  Objective: Vital Signs: Ht 5\' 5"  (1.651 m)   Wt 170 lb (77.1 kg)   BMI 28.29 kg/m   Specialty Comments:  No specialty comments available.  PMFS History: Patient Active Problem List   Diagnosis Date Noted  . H/O ankle fusion 07/18/2019  . Posterior tibial tendinitis, right leg   . Sinusitis 04/10/2018  . Asthma exacerbation 03/28/2018  . SOB (shortness of breath)   . Pyelonephritis 04/15/2016  . Normochromic normocytic anemia 04/15/2016  . Elevated LFTs 04/15/2016  . HNP (herniated nucleus pulposus), lumbar 07/04/2015    Class: Acute  . Anxiety state 08/08/2013  . INSOMNIA  05/20/2008  . Asthma with bronchitis 05/15/2007  . PLEURISY 05/15/2007  . HYPERVENTILATION 05/15/2007  . COUGH 05/15/2007   Past Medical History:  Diagnosis Date  . Anxiety   . Asthma   . Chronic kidney disease    patient denies - 07/09/2019  . Complication of anesthesia    was slow to wake up with one of her surgeries  . Cough   . Headache    hx. migraines  . Hyperventilation   . Insomnia   . Pleurisy   . UTI (urinary tract infection)     Family History  Problem Relation Age of Onset  . Prostate cancer Father   . Allergic rhinitis Mother   . Breast cancer Mother 37  . Angioedema Neg Hx   . Eczema Neg Hx   . Urticaria Neg Hx   . Asthma Neg Hx     Past Surgical History:  Procedure Laterality Date  . ABDOMINAL HYSTERECTOMY    . ANKLE FUSION Right 07/11/2019   Procedure: RIGHT TALONAVICULAR AND SUBTALAR FUSION;  Surgeon: 07/13/2019, MD;  Location: [facility] OR;  Service: Orthopedics;  Laterality: Right;  . LUMBAR LAMINECTOMY N/A 07/04/2015   Procedure: Left L5-S1 MICRODISCECTOMY;  Surgeon: 09/01/2015, MD;  Location: [facility] OR;  Service: Orthopedics;  Laterality: N/A;  . LUMBAR SPINE SURGERY    . UTERINE FIBROID SURGERY    . VESICOVAGINAL FISTULA CLOSURE W/ TAH     Social History   Occupational History  . Occupation: marketing/sales  Tobacco Use  . Smoking status: Never Smoker  . Smokeless tobacco: Never Used  Vaping Use  . Vaping Use: Never used  Substance and Sexual Activity  . Alcohol use: No  . Drug use: No  . Sexual activity: Not on file

## 2020-02-16 ENCOUNTER — Ambulatory Visit
Admission: RE | Admit: 2020-02-16 | Discharge: 2020-02-16 | Disposition: A | Discharge status: Home / Self Care | Payer: Managed Care, Other (non HMO) | Source: Ambulatory Visit | Attending: Physical Medicine and Rehabilitation | Admitting: Physical Medicine and Rehabilitation

## 2020-02-16 ENCOUNTER — Other Ambulatory Visit: Payer: Managed Care, Other (non HMO)

## 2020-02-16 ENCOUNTER — Other Ambulatory Visit: Payer: Self-pay

## 2020-02-16 DIAGNOSIS — G90521 Complex regional pain syndrome I of right lower limb: Secondary | ICD-10-CM

## 2020-03-11 ENCOUNTER — Ambulatory Visit: Payer: Managed Care, Other (non HMO) | Admitting: Physical Medicine and Rehabilitation

## 2020-03-11 ENCOUNTER — Other Ambulatory Visit: Payer: Self-pay

## 2020-03-11 ENCOUNTER — Encounter: Payer: Self-pay | Admitting: Physical Medicine and Rehabilitation

## 2020-03-11 VITALS — BP 145/79 | HR 103

## 2020-03-11 DIAGNOSIS — G90521 Complex regional pain syndrome I of right lower limb: Secondary | ICD-10-CM

## 2020-03-11 DIAGNOSIS — Z981 Arthrodesis status: Secondary | ICD-10-CM

## 2020-03-11 NOTE — Progress Notes (Signed)
[name] - 59 y.o. female MRN [id number]  Date of birth: 12/12/1960  Office Visit Note: Visit Date: 03/11/2020 PCP: [name], MD Referred by: [name], MD  Subjective: Chief Complaint  Patient presents with  . Right Foot - Pain   HPI: [name] is a 59 y.o. female who comes in today For evaluation management and lumbar spine MRI review. Her history is that she is status post right talonavicular subtalar fusion. This has been complicated by thigh significant pain with swelling and allodynia. Presumptive diagnosis was complex regional pain syndrome. She is followed by Dr. Aldean Baker as well as his assistant [place] Bali Persons, PA-C. Recent follow-up with the orthopedic surgeon show that she was feeling somewhat better actually had remove the boot that she has been wearing for quite some time. She feels like she can walk better with the boot off. She is still ambulating slowly with a cane and a crutch. We have completed 2 lumbar sympathetic blocks with at least initial improvement with the first injection. Second injection during the procedure caused her at least to have one episode of a shooting type leg pain which was nondermatomal. The point which she felt this type of pain was pretty far in the musculature not near the spine or nerve root at all this was verified with biplanar imaging of needle position. We were able to complete the injection without difficulty. Because the patient continued to have pain after that time we did obtain MRI of the lumbar spine since she had not had one in quite a while just to make sure we were not missing anything of the lumbar spine that can be causing some of her foot and ankle pain. That MRI is reviewed again today and reviewed with her with pictures and imaging and spine models. Really nothing new to report on the MRI as she has had prior lumbar surgery for discectomy she has some degenerative disc changes but no high-grade central stenosis no  nerve compression no other issues.  Review of Systems  Musculoskeletal: Positive for back pain and joint pain.  All other systems reviewed and are negative.  Otherwise per HPI.  Assessment & Plan: Visit Diagnoses:  1. Complex regional pain syndrome type 1 of right lower extremity   2. H/O ankle fusion     Plan: Findings:  Continued severe right ankle pain status post ankle surgery. Those notes can be reviewed. Sympathetic blocks at least at first seem to be ineffectual but she is doing somewhat better overall. MRI findings show no new findings of the lumbar spine other than prior lumbar surgery. I think at this point if she would like she could seek evaluation at the [facility] for more work-up and treatment of potential for complex regional pain syndrome. I am not sure she meets all the criteria for the [place] criteria for CRPS. Nonetheless they do have more avenues of treatment than we would have here. I will make that referral for her. She continues to take gabapentin and tramadol..    Meds & Orders: No orders of the defined types were placed in this encounter.   Orders Placed This Encounter  Procedures  . Ambulatory referral to Pain Clinic    Follow-up: Return for visit to requesting physician as needed.   Procedures: No procedures performed  No notes on file   Clinical History: MRI LUMBAR SPINE WITHOUT CONTRAST  TECHNIQUE: Multiplanar, multisequence MR imaging of the lumbar spine was performed. No intravenous  contrast was administered.  COMPARISON:  08/29/2015  FINDINGS: Segmentation:  5 lumbar type vertebral bodies.  Alignment:  Normal  Vertebrae:  Normal  Conus medullaris and cauda equina: Conus extends to the L1-2 level. Conus and cauda equina appear normal.  Paraspinal and other soft tissues: Negative  Disc levels:  No significant finding at L3-4 or above. No stenosis of the canal or foramina.  L4-5: Disc degeneration with  endplate osteophytes and bulging of the disc. Question small right laminotomy. Mild facet and ligamentous hypertrophy. Mild stenosis of both lateral recesses and neural foramina, similar the study of 2017.  L5-S1: Previous left hemilaminectomy. Endplate osteophytes and shallow protrusion of the disc. No apparent compressive stenosis.  IMPRESSION: L4-5: No change. Probable previous right laminotomy. Bulging of the disc. Mild facet hypertrophy. Mild stenosis of both lateral recesses and neural foramina without distinct neural compression.  L5-S1: Previous left hemilaminectomy and discectomy. Endplate osteophytes and shallow protrusion of the disc. No apparent compressive canal or foraminal stenosis.   Electronically Signed   By: Paulina Fusi M.D.   On: 02/16/2020 18:40   She reports that she has never smoked. She has never used smokeless tobacco.  Recent Labs    03/15/19 1134  LABURIC 6.4    Objective:  VS:  HT:    WT:   BMI:     BP:(!) 145/79  HR:(!) 103bpm  TEMP: ( )  RESP:  Physical Exam Vitals and nursing note reviewed.  Constitutional:      General: She is not in acute distress.    Appearance: Normal appearance. She is well-developed. She is not ill-appearing.  HENT:     Head: Normocephalic and atraumatic.  Eyes:     Conjunctiva/sclera: Conjunctivae normal.     Pupils: Pupils are equal, round, and reactive to light.  Cardiovascular:     Rate and Rhythm: Normal rate.     Pulses: Normal pulses.  Pulmonary:     Effort: Pulmonary effort is normal.  Musculoskeletal:     Cervical back: Normal range of motion. No rigidity.     Right lower leg: No edema.     Left lower leg: No edema.     Comments: Patient is slow to rise from a seated position. She does ambulate with a crutch uses a cane. She is not with her walking boot today and she does ambulate with an antalgic gait. She does have some right lower extremity swelling and allodynia. She has no atrophy or  dystrophic changes. Left side is normal.  Skin:    General: Skin is warm and dry.     Findings: No erythema or rash.  Neurological:     General: No focal deficit present.     Mental Status: She is alert and oriented to person, place, and time.     Sensory: No sensory deficit.     Motor: No abnormal muscle tone.     Coordination: Coordination normal.     Gait: Gait normal.  Psychiatric:        Mood and Affect: Mood normal.        Behavior: Behavior normal.     Ortho Exam  Imaging: No results found.  Past Medical/Family/Surgical/Social History: Medications & Allergies reviewed per EMR, new medications updated. Patient Active Problem List   Diagnosis Date Noted  . H/O ankle fusion 07/18/2019  . Posterior tibial tendinitis, right leg   . Sinusitis 04/10/2018  . Asthma exacerbation 03/28/2018  . SOB (shortness of breath)   . Pyelonephritis  04/15/2016  . Normochromic normocytic anemia 04/15/2016  . Elevated LFTs 04/15/2016  . HNP (herniated nucleus pulposus), lumbar 07/04/2015    Class: Acute  . Anxiety state 08/08/2013  . INSOMNIA 05/20/2008  . Asthma with bronchitis 05/15/2007  . PLEURISY 05/15/2007  . HYPERVENTILATION 05/15/2007  . COUGH 05/15/2007   Past Medical History:  Diagnosis Date  . Anxiety   . Asthma   . Chronic kidney disease    patient denies - 07/09/2019  . Complication of anesthesia    was slow to wake up with one of her surgeries  . Cough   . Headache    hx. migraines  . Hyperventilation   . Insomnia   . Pleurisy   . UTI (urinary tract infection)    Family History  Problem Relation Age of Onset  . Prostate cancer Father   . Allergic rhinitis Mother   . Breast cancer Mother 38  . Angioedema Neg Hx   . Eczema Neg Hx   . Urticaria Neg Hx   . Asthma Neg Hx    Past Surgical History:  Procedure Laterality Date  . ABDOMINAL HYSTERECTOMY    . ANKLE FUSION Right 07/11/2019   Procedure: RIGHT TALONAVICULAR AND SUBTALAR FUSION;  Surgeon: Nadara Mustard, MD;  Location: [facility] OR;  Service: Orthopedics;  Laterality: Right;  . LUMBAR LAMINECTOMY N/A 07/04/2015   Procedure: Left L5-S1 MICRODISCECTOMY;  Surgeon: Kerrin Champagne, MD;  Location: [facility] OR;  Service: Orthopedics;  Laterality: N/A;  . LUMBAR SPINE SURGERY    . UTERINE FIBROID SURGERY    . VESICOVAGINAL FISTULA CLOSURE W/ TAH     Social History   Occupational History  . Occupation: marketing/sales  Tobacco Use  . Smoking status: Never Smoker  . Smokeless tobacco: Never Used  Vaping Use  . Vaping Use: Never used  Substance and Sexual Activity  . Alcohol use: No  . Drug use: No  . Sexual activity: Not on file

## 2020-03-11 NOTE — Progress Notes (Signed)
Here for MRI review. Continued right foot pain. Stopped wearing boot last week because she felt it was hurting worse with it on. Pain is 10 with walking, 6 with sitting. Numeric Pain Rating Scale and Functional Assessment Average Pain 10   In the last MONTH (on 0-10 scale) has pain interfered with the following?  1. General activity like being  able to carry out your everyday physical activities such as walking, climbing stairs, carrying groceries, or moving a chair?  Rating(10)

## 2020-03-14 ENCOUNTER — Ambulatory Visit (INDEPENDENT_AMBULATORY_CARE_PROVIDER_SITE_OTHER): Payer: Managed Care, Other (non HMO) | Admitting: Physician Assistant

## 2020-03-14 ENCOUNTER — Encounter: Payer: Self-pay | Admitting: Physician Assistant

## 2020-03-14 VITALS — Ht 65.0 in | Wt 170.0 lb

## 2020-03-14 DIAGNOSIS — M79671 Pain in right foot: Secondary | ICD-10-CM

## 2020-03-14 MED ORDER — NAPROXEN 500 MG PO TABS: ORAL_TABLET | ORAL | 0 refills | Status: DC

## 2020-03-14 MED ORDER — OXYCODONE-ACETAMINOPHEN 5-325 MG PO TABS: 1.0000 | ORAL_TABLET | Freq: Three times a day (TID) | ORAL | 0 refills | Status: DC | PRN

## 2020-03-14 NOTE — Progress Notes (Signed)
Office Visit Note   Patient: [name]           Date of Birth: February 01, 1961           MRN: [id number] Visit Date: 03/14/2020              Requested by: [name], MD [address] [place],  [place] [address] PCP: [name], MD  Chief Complaint  Patient presents with  . Right Ankle - Follow-up    07/29/19 talonavicular and subtalar fusion with CRPS       HPI: This is a pleasant 59 year old woman who is 8 months status post talonavicular and subtalar fusion.  Unfortunately she had developed CRPS.  Despite this she is trying to wean out of her boot and mostly wears a regular shoe with the assistance of crutches she has tried blocks and different types of medication.  She has been given the approval to see chronic pain management.  She is going to call the office and hopefully that will get done very shortly.  Assessment & Plan: Visit Diagnoses: No diagnosis found.  Plan: I have told her I will call her in a refill of some oxycodone just till she gets to pain management follow-up in 1 month  Follow-Up Instructions: No follow-ups on file.   Ortho Exam  Patient is alert, oriented, no adenopathy, well-dressed, normal affect, normal respiratory effort. Focused examination demonstrates well-healed surgical incision minimal swelling she does overall have good ankle range of motion compartments are soft and nontender she is tolerating light touch which is an improvement no cellulitis or sign of infection  Imaging: No results found. No images are attached to the encounter.  Labs: Lab Results  Component Value Date   HGBA1C  05/14/2008    5.6 (NOTE)   The ADA recommends the following therapeutic goal for glycemic   control related to Hgb A1C measurement:   Goal of Therapy:   < 7.0% Hgb A1C   Reference: American Diabetes Association: Clinical Practice   Recommendations 2008, Diabetes Care,  2008, 31:(Suppl 1).   ESRSEDRATE 11 03/15/2019   LABURIC 6.4  03/15/2019   REPTSTATUS 04/20/2016 FINAL 04/15/2016   REPTSTATUS 04/20/2016 FINAL 04/15/2016   CULT  04/15/2016    NO GROWTH 5 DAYS Performed at [facility]    CULT  04/15/2016    NO GROWTH 5 DAYS Performed at [facility]    [facility] ESCHERICHIA COLI (A) 04/09/2016     Lab Results  Component Value Date   ALBUMIN 3.2 (L) 04/18/2016   ALBUMIN 3.2 (L) 04/16/2016   ALBUMIN 3.3 (L) 04/15/2016   LABURIC 6.4 03/15/2019    Lab Results  Component Value Date   MG 1.9 04/18/2016   MG 2.0 04/16/2016   MG 2.1 05/16/2008   No results found for: VD25OH  No results found for: PREALBUMIN CBC EXTENDED Latest Ref Rng & Units 07/11/2019 03/15/2019 04/10/2018  WBC 4.0 - 10.5 K/uL 5.2 4.3 6.4  RBC 3.87 - 5.11 MIL/uL 4.04 4.19 4.30  HGB 12.0 - 15.0 g/dL 11.7(L) 11.9 12.5  HCT 36 - 46 % 35.8(L) 36.0 37.2  PLT 150 - 400 K/uL 180 232 188.0  NEUTROABS 1.4 - 7.7 K/uL - - 4.2  LYMPHSABS 0.7 - 4.0 K/uL - - 1.9     Body mass index is 28.29 kg/m.  Orders:  No orders of the defined types were placed in this encounter.  No orders of the defined types were placed  in this encounter.    Procedures: No procedures performed  Clinical Data: No additional findings.  ROS:  All other systems negative, except as noted in the HPI. Review of Systems  Objective: Vital Signs: Ht 5\' 5"  (1.651 m)   Wt 170 lb (77.1 kg)   BMI 28.29 kg/m   Specialty Comments:  No specialty comments available.  PMFS History: Patient Active Problem List   Diagnosis Date Noted  . H/O ankle fusion 07/18/2019  . Posterior tibial tendinitis, right leg   . Sinusitis 04/10/2018  . Asthma exacerbation 03/28/2018  . SOB (shortness of breath)   . Pyelonephritis 04/15/2016  . Normochromic normocytic anemia 04/15/2016  . Elevated LFTs 04/15/2016  . HNP (herniated nucleus pulposus), lumbar 07/04/2015    Class: Acute  . Anxiety state 08/08/2013  . INSOMNIA 05/20/2008  . Asthma with bronchitis  05/15/2007  . PLEURISY 05/15/2007  . HYPERVENTILATION 05/15/2007  . COUGH 05/15/2007   Past Medical History:  Diagnosis Date  . Anxiety   . Asthma   . Chronic kidney disease    patient denies - 07/09/2019  . Complication of anesthesia    was slow to wake up with one of her surgeries  . Cough   . Headache    hx. migraines  . Hyperventilation   . Insomnia   . Pleurisy   . UTI (urinary tract infection)     Family History  Problem Relation Age of Onset  . Prostate cancer Father   . Allergic rhinitis Mother   . Breast cancer Mother 20  . Angioedema Neg Hx   . Eczema Neg Hx   . Urticaria Neg Hx   . Asthma Neg Hx     Past Surgical History:  Procedure Laterality Date  . ABDOMINAL HYSTERECTOMY    . ANKLE FUSION Right 07/11/2019   Procedure: RIGHT TALONAVICULAR AND SUBTALAR FUSION;  Surgeon: 07/13/2019, MD;  Location: [facility] OR;  Service: Orthopedics;  Laterality: Right;  . LUMBAR LAMINECTOMY N/A 07/04/2015   Procedure: Left L5-S1 MICRODISCECTOMY;  Surgeon: 09/01/2015, MD;  Location: [facility] OR;  Service: Orthopedics;  Laterality: N/A;  . LUMBAR SPINE SURGERY    . UTERINE FIBROID SURGERY    . VESICOVAGINAL FISTULA CLOSURE W/ TAH     Social History   Occupational History  . Occupation: marketing/sales  Tobacco Use  . Smoking status: Never Smoker  . Smokeless tobacco: Never Used  Vaping Use  . Vaping Use: Never used  Substance and Sexual Activity  . Alcohol use: No  . Drug use: No  . Sexual activity: Not on file

## 2020-04-06 ENCOUNTER — Other Ambulatory Visit: Payer: Self-pay | Admitting: Physician Assistant

## 2020-04-11 ENCOUNTER — Encounter: Payer: Self-pay | Admitting: Physician Assistant

## 2020-04-11 ENCOUNTER — Ambulatory Visit (INDEPENDENT_AMBULATORY_CARE_PROVIDER_SITE_OTHER): Payer: Managed Care, Other (non HMO) | Admitting: Physician Assistant

## 2020-04-11 DIAGNOSIS — M7671 Peroneal tendinitis, right leg: Secondary | ICD-10-CM

## 2020-04-11 NOTE — Progress Notes (Signed)
Office Visit Note   Patient: [name]           Date of Birth: 01-Oct-1960           MRN: [id number] Visit Date: 04/11/2020              Requested by: [name], MD [address] [place],  [place] [address] PCP: [name], MD  No chief complaint on file.     HPI: This is a pleasant 59 year old woman who is status post right talonavicular subtalar fusion.  She has struggled with pain in the right ankle.  She is tried both immobilization casting in a boot she has worked with Dr. Alvester Morin on injections.  She comes in today in a regular shoe although an antalgic gait.  She is having a lot of pain along the lateral side of her foot.  She does however tell me that she is scheduled for a ketamine infusion in November.  Assessment & Plan: Visit Diagnoses: No diagnosis found.  Plan: I have given the patient a lateral heel wedge just to try she understands if it makes her pain worse she is to remove it.  A lot of her pain is along the peroneal tendon.  I would like her to follow-up a week after her infusion.  I am hopeful if this helps her that we could start her on a course of physical therapy for gait training as she is had an antalgic gait for a very long period of time  Follow-Up Instructions: No follow-ups on file.   Ortho Exam  Patient is alert, oriented, no adenopathy, well-dressed, normal affect, normal respiratory effort. Right foot well-healed surgical incisions no swelling no erythema she is acutely tender around the lateral foot at the insertion of the peroneal tendon also just below the lateral malleolus this and up into the lateral side of the ankle.  She also has some tenderness in the Achilles.  Swelling is well controlled no evidence of any cellulitis or infection  Imaging: No results found. No images are attached to the encounter.  Labs: Lab Results  Component Value Date   HGBA1C  05/14/2008    5.6 (NOTE)   The ADA recommends the following  therapeutic goal for glycemic   control related to Hgb A1C measurement:   Goal of Therapy:   < 7.0% Hgb A1C   Reference: American Diabetes Association: Clinical Practice   Recommendations 2008, Diabetes Care,  2008, 31:(Suppl 1).   ESRSEDRATE 11 03/15/2019   LABURIC 6.4 03/15/2019   REPTSTATUS 04/20/2016 FINAL 04/15/2016   REPTSTATUS 04/20/2016 FINAL 04/15/2016   CULT  04/15/2016    NO GROWTH 5 DAYS Performed at [facility]    CULT  04/15/2016    NO GROWTH 5 DAYS Performed at [facility]    [facility] ESCHERICHIA COLI (A) 04/09/2016     Lab Results  Component Value Date   ALBUMIN 3.2 (L) 04/18/2016   ALBUMIN 3.2 (L) 04/16/2016   ALBUMIN 3.3 (L) 04/15/2016   LABURIC 6.4 03/15/2019    Lab Results  Component Value Date   MG 1.9 04/18/2016   MG 2.0 04/16/2016   MG 2.1 05/16/2008   No results found for: VD25OH  No results found for: PREALBUMIN CBC EXTENDED Latest Ref Rng & Units 07/11/2019 03/15/2019 04/10/2018  WBC 4.0 - 10.5 K/uL 5.2 4.3 6.4  RBC 3.87 - 5.11 MIL/uL 4.04 4.19 4.30  HGB 12.0 - 15.0 g/dL 11.7(L) 11.9 12.5  HCT 36 - 46 % 35.8(L) 36.0 37.2  PLT 150 - 400 K/uL 180 232 188.0  NEUTROABS 1.4 - 7.7 K/uL - - 4.2  LYMPHSABS 0.7 - 4.0 K/uL - - 1.9     There is no height or weight on file to calculate BMI.  Orders:  No orders of the defined types were placed in this encounter.  No orders of the defined types were placed in this encounter.    Procedures: No procedures performed  Clinical Data: No additional findings.  ROS:  All other systems negative, except as noted in the HPI. Review of Systems  Objective: Vital Signs: There were no vitals taken for this visit.  Specialty Comments:  No specialty comments available.  PMFS History: Patient Active Problem List   Diagnosis Date Noted  . H/O ankle fusion 07/18/2019  . Posterior tibial tendinitis, right leg   . Sinusitis 04/10/2018  . Asthma exacerbation 03/28/2018  . SOB (shortness  of breath)   . Pyelonephritis 04/15/2016  . Normochromic normocytic anemia 04/15/2016  . Elevated LFTs 04/15/2016  . HNP (herniated nucleus pulposus), lumbar 07/04/2015    Class: Acute  . Anxiety state 08/08/2013  . INSOMNIA 05/20/2008  . Asthma with bronchitis 05/15/2007  . PLEURISY 05/15/2007  . HYPERVENTILATION 05/15/2007  . COUGH 05/15/2007   Past Medical History:  Diagnosis Date  . Anxiety   . Asthma   . Chronic kidney disease    patient denies - 07/09/2019  . Complication of anesthesia    was slow to wake up with one of her surgeries  . Cough   . Headache    hx. migraines  . Hyperventilation   . Insomnia   . Pleurisy   . UTI (urinary tract infection)     Family History  Problem Relation Age of Onset  . Prostate cancer Father   . Allergic rhinitis Mother   . Breast cancer Mother 67  . Angioedema Neg Hx   . Eczema Neg Hx   . Urticaria Neg Hx   . Asthma Neg Hx     Past Surgical History:  Procedure Laterality Date  . ABDOMINAL HYSTERECTOMY    . ANKLE FUSION Right 07/11/2019   Procedure: RIGHT TALONAVICULAR AND SUBTALAR FUSION;  Surgeon: Nadara Mustard, MD;  Location: [facility] OR;  Service: Orthopedics;  Laterality: Right;  . LUMBAR LAMINECTOMY N/A 07/04/2015   Procedure: Left L5-S1 MICRODISCECTOMY;  Surgeon: Kerrin Champagne, MD;  Location: [facility] OR;  Service: Orthopedics;  Laterality: N/A;  . LUMBAR SPINE SURGERY    . UTERINE FIBROID SURGERY    . VESICOVAGINAL FISTULA CLOSURE W/ TAH     Social History   Occupational History  . Occupation: marketing/sales  Tobacco Use  . Smoking status: Never Smoker  . Smokeless tobacco: Never Used  Vaping Use  . Vaping Use: Never used  Substance and Sexual Activity  . Alcohol use: No  . Drug use: No  . Sexual activity: Not on file

## 2020-04-18 ENCOUNTER — Ambulatory Visit: Payer: Managed Care, Other (non HMO) | Admitting: Physician Assistant

## 2020-04-27 ENCOUNTER — Other Ambulatory Visit: Payer: Self-pay | Admitting: Family

## 2020-04-28 ENCOUNTER — Other Ambulatory Visit: Payer: Self-pay | Admitting: Physician Assistant

## 2020-04-28 NOTE — Telephone Encounter (Signed)
Please see message attached to this rx. I do not know if this is something that we gave the pt?

## 2020-05-01 ENCOUNTER — Other Ambulatory Visit: Payer: Self-pay | Admitting: Physician Assistant

## 2020-05-09 ENCOUNTER — Ambulatory Visit: Payer: Managed Care, Other (non HMO) | Admitting: Physician Assistant

## 2020-05-21 ENCOUNTER — Other Ambulatory Visit: Payer: Self-pay | Admitting: Physician Assistant

## 2020-05-30 ENCOUNTER — Ambulatory Visit (INDEPENDENT_AMBULATORY_CARE_PROVIDER_SITE_OTHER): Payer: Managed Care, Other (non HMO) | Admitting: Physician Assistant

## 2020-05-30 ENCOUNTER — Other Ambulatory Visit: Payer: Self-pay

## 2020-05-30 ENCOUNTER — Encounter: Payer: Self-pay | Admitting: Physician Assistant

## 2020-05-30 VITALS — Ht 65.0 in | Wt 170.0 lb

## 2020-05-30 DIAGNOSIS — Z981 Arthrodesis status: Secondary | ICD-10-CM

## 2020-05-30 DIAGNOSIS — M79671 Pain in right foot: Secondary | ICD-10-CM

## 2020-05-30 MED ORDER — TRAMADOL HCL 50 MG PO TABS: 100.0000 mg | ORAL_TABLET | Freq: Four times a day (QID) | ORAL | 0 refills | Status: DC | PRN

## 2020-05-30 NOTE — Progress Notes (Signed)
Office Visit Note   Patient: [name]           Date of Birth: 10/03/1960           MRN: [id number] Visit Date: 05/30/2020              Requested by: [name], MD [address] [place],  [place] [address] PCP: [name], MD  Chief Complaint  Patient presents with  . Right Foot - Follow-up    S/p talonavicular and subtalar fusion      HPI: Patient is a 59 year old woman who is status post right subtalar and talonavicular fusion.  She has been struggling with pain.  She has tried blocks medication.  Most recently she has completed ketamine infusions.  She is 2 weeks status post the last infusion and she has been told she will not feel the effects for another 2 weeks if it helps.  She states some of the nerve pain has gotten better and she is wearing a regular boot.  She is unable to wear her regular shoes because she says her foot turns and.  Assessment & Plan: Visit Diagnoses:  1. Right foot pain   2. H/O ankle fusion     Plan: We will try another round of physical therapy.  This may be helpful to assess her gait.  We will also call in a small amount of tramadol for her.  We will follow up with Dr. Lajoyce Corners in a month to see the success of the recent infusions.  We briefly talked about chronic pain management but she states so many narcotics besides the tramadol give her significant itching  Follow-Up Instructions: No follow-ups on file.   Ortho Exam  Patient is alert, oriented, no adenopathy, well-dressed, normal affect, normal respiratory effort. Right foot she tolerates manipulation of the ankle she tolerates palpation.  Pulses are intact.  No swelling no cellulitis well-healed surgical incision  Imaging: No results found. No images are attached to the encounter.  Labs: Lab Results  Component Value Date   HGBA1C  05/14/2008    5.6 (NOTE)   The ADA recommends the following therapeutic goal for glycemic   control related to Hgb A1C  measurement:   Goal of Therapy:   < 7.0% Hgb A1C   Reference: American Diabetes Association: Clinical Practice   Recommendations 2008, Diabetes Care,  2008, 31:(Suppl 1).   ESRSEDRATE 11 03/15/2019   LABURIC 6.4 03/15/2019   REPTSTATUS 04/20/2016 FINAL 04/15/2016   REPTSTATUS 04/20/2016 FINAL 04/15/2016   CULT  04/15/2016    NO GROWTH 5 DAYS Performed at [facility]    CULT  04/15/2016    NO GROWTH 5 DAYS Performed at [facility]    [facility] ESCHERICHIA COLI (A) 04/09/2016     Lab Results  Component Value Date   ALBUMIN 3.2 (L) 04/18/2016   ALBUMIN 3.2 (L) 04/16/2016   ALBUMIN 3.3 (L) 04/15/2016   LABURIC 6.4 03/15/2019    Lab Results  Component Value Date   MG 1.9 04/18/2016   MG 2.0 04/16/2016   MG 2.1 05/16/2008   No results found for: VD25OH  No results found for: PREALBUMIN CBC EXTENDED Latest Ref Rng & Units 07/11/2019 03/15/2019 04/10/2018  WBC 4.0 - 10.5 K/uL 5.2 4.3 6.4  RBC 3.87 - 5.11 MIL/uL 4.04 4.19 4.30  HGB 12.0 - 15.0 g/dL 11.7(L) 11.9 12.5  HCT 36 - 46 % 35.8(L) 36.0 37.2  PLT 150 - 400 K/uL  180 232 188.0  NEUTROABS 1.4 - 7.7 K/uL - - 4.2  LYMPHSABS 0.7 - 4.0 K/uL - - 1.9     Body mass index is 28.29 kg/m.  Orders:  Orders Placed This Encounter  Procedures  . Ambulatory referral to Physical Therapy   Meds ordered this encounter  Medications  . traMADol (ULTRAM) 50 MG tablet    Sig: Take 2 tablets (100 mg total) by mouth every 6 (six) hours as needed for moderate pain.    Dispense:  30 tablet    Refill:  0     Procedures: No procedures performed  Clinical Data: No additional findings.  ROS:  All other systems negative, except as noted in the HPI. Review of Systems  Objective: Vital Signs: Ht 5\' 5"  (1.651 m)   Wt 170 lb (77.1 kg)   BMI 28.29 kg/m   Specialty Comments:  No specialty comments available.  PMFS History: Patient Active Problem List   Diagnosis Date Noted  . H/O ankle fusion 07/18/2019  .  Posterior tibial tendinitis, right leg   . Sinusitis 04/10/2018  . Asthma exacerbation 03/28/2018  . SOB (shortness of breath)   . Pyelonephritis 04/15/2016  . Normochromic normocytic anemia 04/15/2016  . Elevated LFTs 04/15/2016  . HNP (herniated nucleus pulposus), lumbar 07/04/2015    Class: Acute  . Anxiety state 08/08/2013  . INSOMNIA 05/20/2008  . Asthma with bronchitis 05/15/2007  . PLEURISY 05/15/2007  . HYPERVENTILATION 05/15/2007  . COUGH 05/15/2007   Past Medical History:  Diagnosis Date  . Anxiety   . Asthma   . Chronic kidney disease    patient denies - 07/09/2019  . Complication of anesthesia    was slow to wake up with one of her surgeries  . Cough   . Headache    hx. migraines  . Hyperventilation   . Insomnia   . Pleurisy   . UTI (urinary tract infection)     Family History  Problem Relation Age of Onset  . Prostate cancer Father   . Allergic rhinitis Mother   . Breast cancer Mother 60  . Angioedema Neg Hx   . Eczema Neg Hx   . Urticaria Neg Hx   . Asthma Neg Hx     Past Surgical History:  Procedure Laterality Date  . ABDOMINAL HYSTERECTOMY    . ANKLE FUSION Right 07/11/2019   Procedure: RIGHT TALONAVICULAR AND SUBTALAR FUSION;  Surgeon: 07/13/2019, MD;  Location: [facility] OR;  Service: Orthopedics;  Laterality: Right;  . LUMBAR LAMINECTOMY N/A 07/04/2015   Procedure: Left L5-S1 MICRODISCECTOMY;  Surgeon: 09/01/2015, MD;  Location: [facility] OR;  Service: Orthopedics;  Laterality: N/A;  . LUMBAR SPINE SURGERY    . UTERINE FIBROID SURGERY    . VESICOVAGINAL FISTULA CLOSURE W/ TAH     Social History   Occupational History  . Occupation: marketing/sales  Tobacco Use  . Smoking status: Never Smoker  . Smokeless tobacco: Never Used  Vaping Use  . Vaping Use: Never used  Substance and Sexual Activity  . Alcohol use: No  . Drug use: No  . Sexual activity: Not on file

## 2020-06-09 ENCOUNTER — Encounter: Payer: Self-pay | Admitting: Physical Therapy

## 2020-06-09 ENCOUNTER — Other Ambulatory Visit: Payer: Self-pay

## 2020-06-09 ENCOUNTER — Ambulatory Visit: Payer: Managed Care, Other (non HMO) | Attending: Physician Assistant | Admitting: Physical Therapy

## 2020-06-09 DIAGNOSIS — R262 Difficulty in walking, not elsewhere classified: Secondary | ICD-10-CM | POA: Diagnosis present

## 2020-06-09 DIAGNOSIS — M25571 Pain in right ankle and joints of right foot: Secondary | ICD-10-CM | POA: Diagnosis not present

## 2020-06-10 ENCOUNTER — Encounter: Payer: Self-pay | Admitting: Physical Therapy

## 2020-06-10 NOTE — Addendum Note (Signed)
Addended by: Dessie Coma on: 06/10/2020 09:30 AM   Modules accepted: Orders

## 2020-06-10 NOTE — Therapy (Signed)
[facility] [address] [place], [place], [address] Phone: [phone]   Fax:  [phone]  Physical Therapy Evaluation  Patient Details  Name: [name] MRN: [id number] Date of Birth: Dec 24, 1960 Referring Provider (PT): West Bali Persons PA   Encounter Date: 06/09/2020   PT End of Session - 06/10/20 0806    Visit Number 1    Number of Visits 12    Authorization Type [employer]    PT Start Time 1625    PT Stop Time 1710    PT Time Calculation (min) 45 min    Activity Tolerance Patient tolerated treatment well    Behavior During Therapy [facility] for tasks assessed/performed           Past Medical History:  Diagnosis Date  . Anxiety   . Asthma   . Chronic kidney disease    patient denies - 07/09/2019  . Complication of anesthesia    was slow to wake up with one of her surgeries  . Cough   . Headache    hx. migraines  . Hyperventilation   . Insomnia   . Pleurisy   . UTI (urinary tract infection)     Past Surgical History:  Procedure Laterality Date  . ABDOMINAL HYSTERECTOMY    . ANKLE FUSION Right 07/11/2019   Procedure: RIGHT TALONAVICULAR AND SUBTALAR FUSION;  Surgeon: Nadara Mustard, MD;  Location: [facility] OR;  Service: Orthopedics;  Laterality: Right;  . LUMBAR LAMINECTOMY N/A 07/04/2015   Procedure: Left L5-S1 MICRODISCECTOMY;  Surgeon: Kerrin Champagne, MD;  Location: [facility] OR;  Service: Orthopedics;  Laterality: N/A;  . LUMBAR SPINE SURGERY    . UTERINE FIBROID SURGERY    . VESICOVAGINAL FISTULA CLOSURE W/ TAH      There were no vitals filed for this visit.    Subjective Assessment - 06/09/20 1628    Subjective Patient had an ankle fusion in January 2021. Since that point she had pain and burning in her lateral foot and also her heel. She feels like her foot is tilted (supinated). Her pain prioer to the surgery was on the inside of her foot. She feels like the pain is about the same. She had PT prior to the fusion but  it was too painful.    Pertinent History anxiety, lumbar laminectomy    How long can you stand comfortably? painful as soon as she stands    How long can you walk comfortably? can walk it is just painful; has to wear flats    Diagnostic tests MRI: end plate osteophytes with eviance of previous laminectomy    Patient Stated Goals walk with less pain    Currently in Pain? Yes    Pain Score 8     Pain Location Ankle    Pain Orientation Right;Lateral    Pain Descriptors / Indicators Aching;Burning    Pain Type Chronic pain    Pain Radiating Towards lateral foot and heel    Pain Onset More than a month ago    Aggravating Factors  standingt, walking    Pain Relieving Factors ice can help at times    Effect of Pain on Daily Activities difficulty perfroming ADL's              [facility] PT Assessment - 06/10/20 0001      Assessment   Medical Diagnosis Right Foot Pain    Referring Provider (PT) West Bali Persons PA    Onset Date/Surgical Date --  January 2021 with current pain   Hand Dominance Right    Next MD Visit 06/26/2020    Prior Therapy Had therapy prior to fusion      Precautions   Precautions None      Restrictions   Weight Bearing Restrictions No      Balance Screen   Has the patient fallen in the past 6 months No    Has the patient had a decrease in activity level because of a fear of falling?  No    Is the patient reluctant to leave their home because of a fear of falling?  No      Home Environment   Living Environment Private residence    Additional Comments has a flight of steps inside. 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 Patient feels that she is overly supinating but it may just she can feel her lateral foot.    Focus on Therapeutic Outcomes (FOTO)  No FOTO      Sensation   Light Touch Appears Intact    Additional Comments has not had numbess or tingling sicne the injections      Coordination   Gross Motor Movements are Fluid and Coordinated Yes    Fine Motor Movements are Fluid and Coordinated Yes      AROM   Overall AROM Comments pain with all active movements    Right Ankle Dorsiflexion 10    Right Ankle Inversion 10    Right Ankle Eversion 20      Strength   Right Ankle Dorsiflexion 4+/5    Right Ankle Plantar Flexion 5/5   noyt tested   Right Ankle Inversion 5/5    Right Ankle Eversion 5/5      Palpation   Palpation comment significant sensetivity in lateral scar area and lateral foot      Ambulation/Gait   Gait Comments decreased weight bearing on right foot. Slight supination of foot.                      Objective measurements completed on examination: See above findings.       [facility] Adult PT Treatment/Exercise - 06/10/20 0001      Exercises   Exercises Ankle      Ankle Exercises: Stretches   Other Stretch see instructions. Given desensitization for home. instructed to work on it for 3 weeks. Also given light DF stretch                  PT Education - 06/09/20 1635    Education Details reviewed HEP and symptom mangement    Person(s) Educated Patient    Methods Explanation;Tactile cues;Verbal cues;Demonstration    Comprehension Verbalized understanding;Returned demonstration;Verbal cues required;Tactile cues required            PT Short Term Goals - 06/10/20 4287  PT SHORT TERM GOAL #1   Title Patient willl be independent with desensitization program    Time 3    Period Weeks     Status New    Target Date 07/01/20      PT SHORT TERM GOAL #2   Title Patient will report a 50% devrease in burning and aching pain    Time 3    Period Weeks    Status New    Target Date 07/01/20      PT SHORT TERM GOAL #3   Title Patient will demonstrate 5/5 gross right LE strength    Time 3    Period Weeks    Status New    Target Date 07/01/20             PT Long Term Goals - 06/10/20 0831      PT LONG TERM GOAL #1   Title Patient will wear whatever shoes she wants without increased pain    Time 6    Period Weeks    Status New    Target Date 07/22/20      PT LONG TERM GOAL #2   Title Patient will go up and down steps without pain    Time 6    Period Weeks    Status New    Target Date 07/22/20      PT LONG TERM GOAL #3   Title Patient will go shopping without increased foot pain    Time 6    Period Weeks    Status New    Target Date 07/22/20                  Plan - 06/10/20 0818    Clinical Impression Statement Patient is a 59 year old female with lateral foot and heel pain. She has good movement given that she has had a fusion. She has extreme sensativity to touch on her lateral scapr. She has pain with all movements and pain with weight bearing. She has minor weakness in inversion/eversion. She has an increased arch on the right and stands in slight supination. On her left foot she stands in slight pronation with mild flat foot. She feels like this is a probelm but her it is likley just because of her sensativity on the lateral aspect of her foot. Without the sensativity I we would say her right fot may be in a better position then the left. 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 She had a positive respose to Ketamine shots. She was given a desensitization program to work on at home. We will also utilize mirror box therapy and progress functional strength and mobility training.    Personal Factors and Comorbidities Comorbidity 1;Comorbidity 2     Comorbidities anxiety, ankle fusion    Examination-Activity Limitations Stairs;Squat;Transfers;Locomotion Level    Examination-Participation Restrictions Church;Community Activity;Cleaning;School    Stability/Clinical Decision Making Evolving/Moderate complexity   decreasing ability to ambualate distances   Clinical Decision Making Moderate    Rehab Potential Good    PT Frequency 2x / week    PT Duration 6 weeks    PT Treatment/Interventions ADLs/Self Care Home Management;Electrical Stimulation;Cryotherapy;Iontophoresis 4mg /ml Dexamethasone;Moist Heat;Ultrasound;Gait training;DME Instruction;Functional mobility training;Therapeutic exercise;Therapeutic activities;Neuromuscular re-education;Patient/family education;Manual techniques;Passive range of motion;Taping;Dry needling   mirror box therapy   PT Next Visit Plan utilize mirro box therapy; review desnsitzation; light ther-ex. Gneral LE strengthening; consider bridging SLR 3 way hip clamshell    PT Home Exercise Plan densensitization, gentle ankle DF  Consulted and Agree with Plan of Care Patient           Patient will benefit from skilled therapeutic intervention in order to improve the following deficits and impairments:  Difficulty walking,Abnormal gait,Increased muscle spasms,Decreased scar mobility,Impaired sensation,Decreased mobility,Decreased strength  Visit Diagnosis: Pain in right ankle and joints of right foot  Difficulty in walking, not elsewhere classified     Problem List Patient Active Problem List   Diagnosis Date Noted  . H/O ankle fusion 07/18/2019  . Posterior tibial tendinitis, right leg   . Sinusitis 04/10/2018  . Asthma exacerbation 03/28/2018  . SOB (shortness of breath)   . Pyelonephritis 04/15/2016  . Normochromic normocytic anemia 04/15/2016  . Elevated LFTs 04/15/2016  . HNP (herniated nucleus pulposus), lumbar 07/04/2015    Class: Acute  . Anxiety state 08/08/2013  . INSOMNIA 05/20/2008  .  Asthma with bronchitis 05/15/2007  . PLEURISY 05/15/2007  . HYPERVENTILATION 05/15/2007  . COUGH 05/15/2007     Dessie Coma PT DPT  06/10/2020, 9:27 AM   [facility] [address] [place], [place], [address] Phone: [phone]   Fax:  [phone]  Name: [name] MRN: [id number] Date of Birth: 01/02/1961

## 2020-06-10 NOTE — Patient Instructions (Signed)
Find the softest material you can find -> rub it on any spot that you feel is sensitive. When it does not feel like it is painful to do move to  slightly rougher object (cotton ball) -> a soft towel -> rougher towel- > roughest towel.

## 2020-06-17 ENCOUNTER — Other Ambulatory Visit: Payer: Self-pay | Admitting: Physician Assistant

## 2020-06-26 ENCOUNTER — Encounter: Payer: Self-pay | Admitting: Orthopedic Surgery

## 2020-06-26 ENCOUNTER — Ambulatory Visit (INDEPENDENT_AMBULATORY_CARE_PROVIDER_SITE_OTHER): Payer: Managed Care, Other (non HMO) | Admitting: Orthopedic Surgery

## 2020-06-26 ENCOUNTER — Ambulatory Visit (INDEPENDENT_AMBULATORY_CARE_PROVIDER_SITE_OTHER): Payer: Managed Care, Other (non HMO)

## 2020-06-26 DIAGNOSIS — M76821 Posterior tibial tendinitis, right leg: Secondary | ICD-10-CM | POA: Diagnosis not present

## 2020-06-26 DIAGNOSIS — M79671 Pain in right foot: Secondary | ICD-10-CM | POA: Diagnosis not present

## 2020-06-26 DIAGNOSIS — M25571 Pain in right ankle and joints of right foot: Secondary | ICD-10-CM | POA: Diagnosis not present

## 2020-06-26 NOTE — Progress Notes (Signed)
Office Visit Note   Patient: [name]           Date of Birth: [birth year]           MRN: [id number] Visit Date: 06/26/2020              Requested by: [name], MD [address] [place],  [place] [address] PCP: [name], MD  Chief Complaint  Patient presents with  . Right Ankle - Follow-up      HPI: Patient is seen in follow-up for her right foot for which she developed a complex regional pain syndrome.  Patient initially was treated with Neurontin Cymbalta and Elavil.  She states she is not taking any of these medicines now she subsequently was treated with a sympathetic block and most recently a series of ketamine injections.  Patient states that what ever was mixed with a ketamine made her nauseated.  She states she has been recommended to proceed with a spinal cord stimulator.  She states she does feel much better than she did with the acute complex regional pain symptoms.  She is only taking Ultram for pain at this time.  Assessment & Plan: Visit Diagnoses:  1. Pain in right foot   2. Pain in right ankle and joints of right foot   3. Posterior tibial tendinitis, right leg     Plan: Patient seems about 75% better than her last examination.  She was placed in a size large medical compression stocking and recommended she wear this around-the-clock to help decrease the swelling.  She is scheduled for an appointment with physical therapy and I think this should provide her the final relief of her symptoms.  I agree with the patient that I would not recommend a spinal cord stimulator at this time.  Follow-Up Instructions: Return in about 4 weeks (around 07/24/2020).   Ortho Exam  Patient is alert, oriented, no adenopathy, well-dressed, normal affect, normal respiratory effort. Examination patient has a good pulse the incisions are well-healed she has no swelling in the foot but she does have swelling in the calf and ankle on the right compared to the  left.  Her dystrophic changes are much improved and does not have hypersensitivity to light touch.  She has no pain on the plantar aspect of her foot.  The fusion sites are nontender to palpation.  There is no redness no cellulitis no signs of infection.  Imaging: XR Foot Complete Right  Result Date: 06/26/2020 Three-view radiographs of the right foot shows stable consolidation of the subtalar and talonavicular fusion.  Comparing films today from her previous films the mottled changes to the bone that was present with her complex regional pain syndrome has completely resolved and the bone mineral density is normal.  No images are attached to the encounter.  Labs: Lab Results  Component Value Date   HGBA1C  05/14/2008    5.6 (NOTE)   The ADA recommends the following therapeutic goal for glycemic   control related to Hgb A1C measurement:   Goal of Therapy:   < 7.0% Hgb A1C   Reference: American Diabetes Association: Clinical Practice   Recommendations 2008, Diabetes Care,  2008, 31:(Suppl 1).   ESRSEDRATE 11 03/15/2019   LABURIC 6.4 03/15/2019   REPTSTATUS 04/20/2016 FINAL 04/15/2016   REPTSTATUS 04/20/2016 FINAL 04/15/2016   CULT  04/15/2016    NO GROWTH 5 DAYS Performed at [facility]    CULT  04/15/2016  NO GROWTH 5 DAYS Performed at [facility] ESCHERICHIA COLI (A) 04/09/2016     Lab Results  Component Value Date   ALBUMIN 3.2 (L) 04/18/2016   ALBUMIN 3.2 (L) 04/16/2016   ALBUMIN 3.3 (L) 04/15/2016   LABURIC 6.4 03/15/2019    Lab Results  Component Value Date   MG 1.9 04/18/2016   MG 2.0 04/16/2016   MG 2.1 05/16/2008   No results found for: VD25OH  No results found for: PREALBUMIN CBC EXTENDED Latest Ref Rng & Units 07/11/2019 03/15/2019 04/10/2018  WBC 4.0 - 10.5 K/uL 5.2 4.3 6.4  RBC 3.87 - 5.11 MIL/uL 4.04 4.19 4.30  HGB 12.0 - 15.0 g/dL 11.7(L) 11.9 12.5  HCT 36.0 - 46.0 % 35.8(L) 36.0 37.2  PLT 150 - 400 K/uL 180 232 188.0   NEUTROABS 1.4 - 7.7 K/uL - - 4.2  LYMPHSABS 0.7 - 4.0 K/uL - - 1.9     There is no height or weight on file to calculate BMI.  Orders:  Orders Placed This Encounter  Procedures  . XR Foot Complete Right   No orders of the defined types were placed in this encounter.    Procedures: No procedures performed  Clinical Data: No additional findings.  ROS:  All other systems negative, except as noted in the HPI. Review of Systems  Objective: Vital Signs: There were no vitals taken for this visit.  Specialty Comments:  No specialty comments available.  PMFS History: Patient Active Problem List   Diagnosis Date Noted  . H/O ankle fusion 07/18/2019  . Posterior tibial tendinitis, right leg   . Sinusitis 04/10/2018  . Asthma exacerbation 03/28/2018  . SOB (shortness of breath)   . Pyelonephritis 04/15/2016  . Normochromic normocytic anemia 04/15/2016  . Elevated LFTs 04/15/2016  . HNP (herniated nucleus pulposus), lumbar 07/04/2015    Class: Acute  . Anxiety state 08/08/2013  . INSOMNIA 05/20/2008  . Asthma with bronchitis 05/15/2007  . PLEURISY 05/15/2007  . HYPERVENTILATION 05/15/2007  . COUGH 05/15/2007   Past Medical History:  Diagnosis Date  . Anxiety   . Asthma   . Chronic kidney disease    patient denies - 07/09/2019  . Complication of anesthesia    was slow to wake up with one of her surgeries  . Cough   . Headache    hx. migraines  . Hyperventilation   . Insomnia   . Pleurisy   . UTI (urinary tract infection)     Family History  Problem Relation Age of Onset  . Prostate cancer Father   . Allergic rhinitis Mother   . Breast cancer Mother 30  . Angioedema Neg Hx   . Eczema Neg Hx   . Urticaria Neg Hx   . Asthma Neg Hx     Past Surgical History:  Procedure Laterality Date  . ABDOMINAL HYSTERECTOMY    . ANKLE FUSION Right 07/11/2019   Procedure: RIGHT TALONAVICULAR AND SUBTALAR FUSION;  Surgeon: Nadara Mustard, MD;  Location: [facility] OR;   Service: Orthopedics;  Laterality: Right;  . LUMBAR LAMINECTOMY N/A 07/04/2015   Procedure: Left L5-S1 MICRODISCECTOMY;  Surgeon: Kerrin Champagne, MD;  Location: [facility] OR;  Service: Orthopedics;  Laterality: N/A;  . LUMBAR SPINE SURGERY    . UTERINE FIBROID SURGERY    . VESICOVAGINAL FISTULA CLOSURE W/ TAH     Social History   Occupational History  . Occupation: marketing/sales  Tobacco Use  . Smoking status: Never Smoker  .  Smokeless tobacco: Never Used  Vaping Use  . Vaping Use: Never used  Substance and Sexual Activity  . Alcohol use: No  . Drug use: No  . Sexual activity: Not on file

## 2020-07-01 ENCOUNTER — Other Ambulatory Visit: Payer: Self-pay

## 2020-07-01 ENCOUNTER — Ambulatory Visit: Payer: Managed Care, Other (non HMO) | Attending: Physician Assistant

## 2020-07-01 DIAGNOSIS — R262 Difficulty in walking, not elsewhere classified: Secondary | ICD-10-CM | POA: Diagnosis present

## 2020-07-01 DIAGNOSIS — M25671 Stiffness of right ankle, not elsewhere classified: Secondary | ICD-10-CM | POA: Insufficient documentation

## 2020-07-01 DIAGNOSIS — M25571 Pain in right ankle and joints of right foot: Secondary | ICD-10-CM | POA: Diagnosis not present

## 2020-07-01 NOTE — Therapy (Signed)
[facility] Outpatient Rehabilitation [facility] [address] [place], [place], [address] Phone: [phone]   Fax:  [phone]  Physical Therapy Treatment  Patient Details  Name: [name] MRN: [id number] Date of Birth: 16-Jan-1961 Referring Provider (PT): West Bali Persons PA   Encounter Date: 07/01/2020   PT End of Session - 07/01/20 1656    Visit Number 2    Number of Visits 12    Date for PT Re-Evaluation 07/22/20    Authorization Type [employer]    PT Start Time 1700    PT Stop Time 1743    PT Time Calculation (min) 43 min    Activity Tolerance Patient tolerated treatment well    Behavior During Therapy [facility] for tasks assessed/performed           Past Medical History:  Diagnosis Date  . Anxiety   . Asthma   . Chronic kidney disease    patient denies - 07/09/2019  . Complication of anesthesia    was slow to wake up with one of her surgeries  . Cough   . Headache    hx. migraines  . Hyperventilation   . Insomnia   . Pleurisy   . UTI (urinary tract infection)     Past Surgical History:  Procedure Laterality Date  . ABDOMINAL HYSTERECTOMY    . ANKLE FUSION Right 07/11/2019   Procedure: RIGHT TALONAVICULAR AND SUBTALAR FUSION;  Surgeon: Nadara Mustard, MD;  Location: [facility] OR;  Service: Orthopedics;  Laterality: Right;  . LUMBAR LAMINECTOMY N/A 07/04/2015   Procedure: Left L5-S1 MICRODISCECTOMY;  Surgeon: Kerrin Champagne, MD;  Location: [facility] OR;  Service: Orthopedics;  Laterality: N/A;  . LUMBAR SPINE SURGERY    . UTERINE FIBROID SURGERY    . VESICOVAGINAL FISTULA CLOSURE W/ TAH      There were no vitals filed for this visit.   Subjective Assessment - 07/01/20 1656    Subjective I saw my doctor last week and he gave me this compression sock that has copper in it. It has really helped a lot. I also saw [facility] and they really want to do the spinal cord stimulator, and I really don't want to do that. I think I would feel better just doing physical  therapy."    Pertinent History anxiety, lumbar laminectomy    How long can you stand comfortably? painful as soon as she stands    How long can you walk comfortably? can walk it is just painful; has to wear flats    Diagnostic tests MRI: end plate osteophytes with eviance of previous laminectomy    Patient Stated Goals walk with less pain    Currently in Pain? Yes    Pain Score 7     Pain Location Ankle    Pain Orientation Right;Lateral;Posterior   lateral but also along achilles   Pain Descriptors / Indicators Aching;Burning    Pain Type Chronic pain    Pain Onset More than a month ago              [facility] PT Assessment - 07/01/20 0001      Assessment   Medical Diagnosis Right Foot Pain    Referring Provider (PT) West Bali Persons PA    Onset Date/Surgical Date --   January 2021     AROM   Overall AROM Comments Pain with motion in all planes    AROM Assessment Site Ankle    Right/Left Ankle Right    Right Ankle  Dorsiflexion 10    Right Ankle Plantar Flexion 30    Right Ankle Inversion 8    Right Ankle Eversion 20      Strength   Overall Strength Comments 5/5 initially then pt backs off due to increased pain    Right Ankle Dorsiflexion 5/5    Right Ankle Plantar Flexion 5/5    Right Ankle Inversion 5/5    Right Ankle Eversion 5/5                         [facility] Adult PT Treatment/Exercise - 07/01/20 0001      Ambulation/Gait   Gait Comments Continues to demonstrate antalgic gait with decreased stance time on RLE      Self-Care   Self-Care Other Self-Care Comments    Other Self-Care Comments  Reviewed HEP and discussed continuation of desensitization program. Discussed potential use of inserts or orthotics for increased support for R foot.      Knee/Hip Exercises: Standing   Hip Abduction Stengthening;Both;20 reps;Knee straight    Forward Step Up Right;20 reps;Step Height: 6"    Functional Squat 2 sets;15 reps    Functional Squat Limitations Chair  taps for optimal mechanics      Knee/Hip Exercises: Supine   Straight Leg Raises Strengthening;Right;2 sets;15 reps    Other Supine Knee/Hip Exercises Prone hip EXT 2 x 15      Manual Therapy   Manual Therapy Soft tissue mobilization;Passive ROM    Soft tissue mobilization Gentle STM and scar mobilization within tolerance. Pt with TTP along all scars and R foot overall.    Passive ROM Gentle PROM in all planes within tolerance      Ankle Exercises: Seated   Towel Crunch Other (comment)   30x   Marble Pickup 20 marbles from towel to cup with R foot    Other Seated Ankle Exercises Mirror therapy - B ankle DF/PF then INV/EV      Ankle Exercises: Standing   Other Standing Ankle Exercises Heel and toe raises - rocking back and forth x 15 each direction                  PT Education - 07/01/20 1953    Education Details Reviewed HEP and discussed continuation of desensitization program. Discussed potential use of inserts or orthotics for increased support for R foot.    Person(s) Educated Patient    Methods Explanation;Demonstration;Tactile cues;Verbal cues    Comprehension Verbalized understanding;Returned demonstration;Verbal cues required;Tactile cues required            PT Short Term Goals - 07/01/20 1656      PT SHORT TERM GOAL #1   Title Patient willl be independent with desensitization program    Time 3    Period Weeks    Status Achieved    Target Date 07/01/20      PT SHORT TERM GOAL #2   Title Patient will report a 50% decrease in burning and aching pain    Baseline 07/01/2020: Pt reports 25% decrease in burning and aching pain    Time 3    Period Weeks    Status On-going    Target Date 07/01/20      PT SHORT TERM GOAL #3   Title Patient will demonstrate 5/5 gross right LE strength    Time 3    Period Weeks    Status Achieved    Target Date 07/01/20  PT Long Term Goals - 06/10/20 0831      PT LONG TERM GOAL #1   Title Patient will wear  whatever shoes she wants without increased pain    Time 6    Period Weeks    Status New    Target Date 07/22/20      PT LONG TERM GOAL #2   Title Patient will go up and down steps without pain    Time 6    Period Weeks    Status New    Target Date 07/22/20      PT LONG TERM GOAL #3   Title Patient will go shopping without increased foot pain    Time 6    Period Weeks    Status New    Target Date 07/22/20                 Plan - 07/01/20 1711    Clinical Impression Statement Patient had good tolerance with interventions but continues to have increased sensitivity along all scars in R foot. She states that sometimes it feels as if her foot is "broken" and she continues to have burning pain. Pt advised to continue with desensitization program at home and will update HEP pending response to today's session. She should benefit from continued skilled PT intervention to allow for improved functional strength and mobility.    Personal Factors and Comorbidities Comorbidity 1;Comorbidity 2    Comorbidities anxiety, ankle fusion    Examination-Activity Limitations Stairs;Squat;Transfers;Locomotion Level    Examination-Participation Restrictions Church;Community Activity;Cleaning;School    Stability/Clinical Decision Making Evolving/Moderate complexity   decreasing ability to ambualate distances   Rehab Potential Good    PT Frequency 2x / week    PT Duration 6 weeks    PT Treatment/Interventions ADLs/Self Care Home Management;Electrical Stimulation;Cryotherapy;Iontophoresis 4mg /ml Dexamethasone;Moist Heat;Ultrasound;Gait training;DME Instruction;Functional mobility training;Therapeutic exercise;Therapeutic activities;Neuromuscular re-education;Patient/family education;Manual techniques;Passive range of motion;Taping;Dry needling   mirror box therapy   PT Next Visit Plan Utilize mirror box therapy; continue desnsitzation; light ther-ex. Gneral LE strengthening to tolerance.    PT Home  Exercise Plan densensitization, gentle ankle DF    Consulted and Agree with Plan of Care Patient           Patient will benefit from skilled therapeutic intervention in order to improve the following deficits and impairments:  Difficulty walking,Abnormal gait,Increased muscle spasms,Decreased scar mobility,Impaired sensation,Decreased mobility,Decreased strength  Visit Diagnosis: Pain in right ankle and joints of right foot  Difficulty in walking, not elsewhere classified  Stiffness of right ankle, not elsewhere classified     Problem List Patient Active Problem List   Diagnosis Date Noted  . H/O ankle fusion 07/18/2019  . Posterior tibial tendinitis, right leg   . Sinusitis 04/10/2018  . Asthma exacerbation 03/28/2018  . SOB (shortness of breath)   . Pyelonephritis 04/15/2016  . Normochromic normocytic anemia 04/15/2016  . Elevated LFTs 04/15/2016  . HNP (herniated nucleus pulposus), lumbar 07/04/2015    Class: Acute  . Anxiety state 08/08/2013  . INSOMNIA 05/20/2008  . Asthma with bronchitis 05/15/2007  . PLEURISY 05/15/2007  . HYPERVENTILATION 05/15/2007  . COUGH 05/15/2007     Haydee Monica, PT, DPT 07/01/20 7:59 PM  [facility] [address] [place], [place], [address] Phone: [phone]   Fax:  [phone]  Name: DIONA PEREGOY MRN: [id number] Date of Birth: 1961-05-20

## 2020-07-03 IMAGING — US US EXTREM LOW VENOUS*R*
1 series · 13 of 24 positions shown · non-contrast
Comparison: None.

CLINICAL DATA: 58-year-old female with right foot pain for the past
3-4 weeks



[Series 1: us extrem low venous*right* · 13 of 33 slices shown]
[im 1/33]
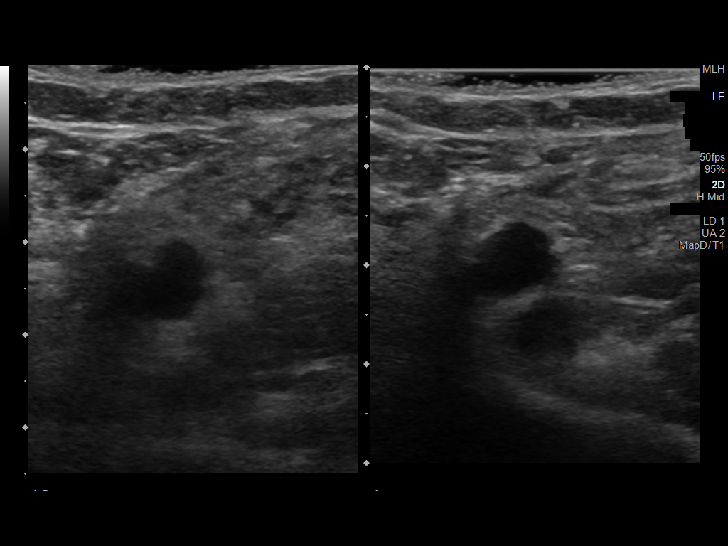
[im 3/33]
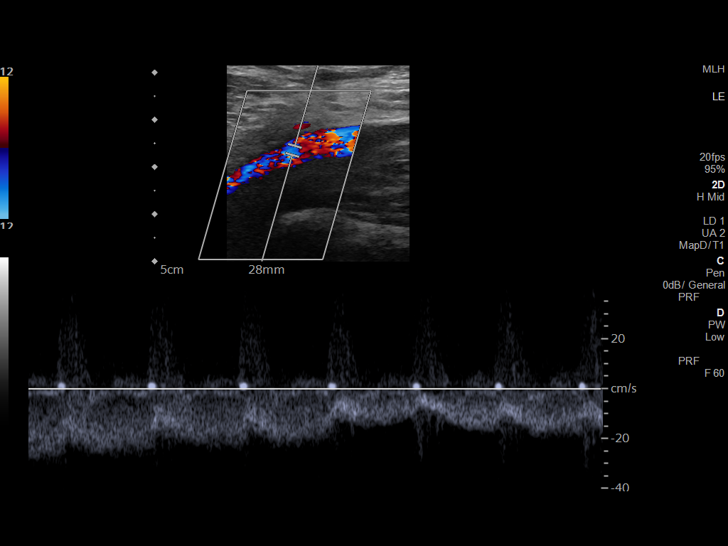
[im 6/33]
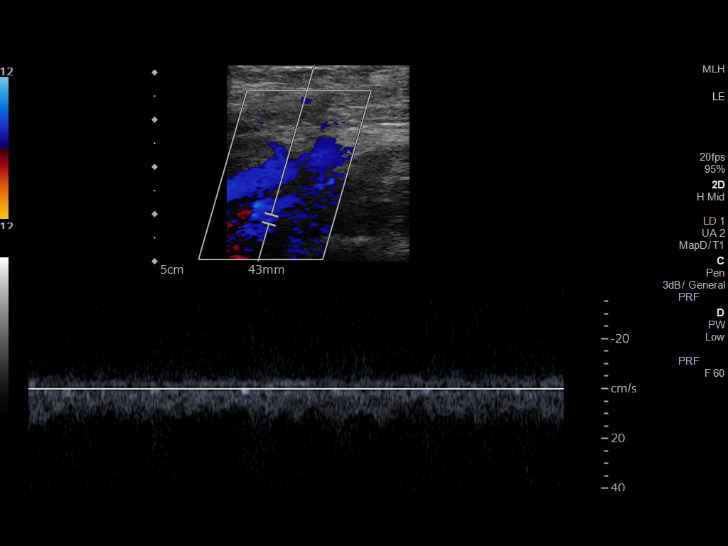
[im 9/33]
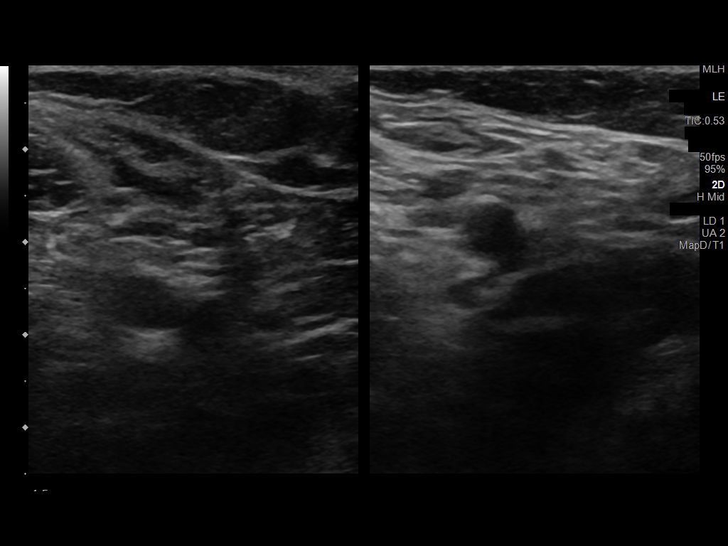
[im 12/33]
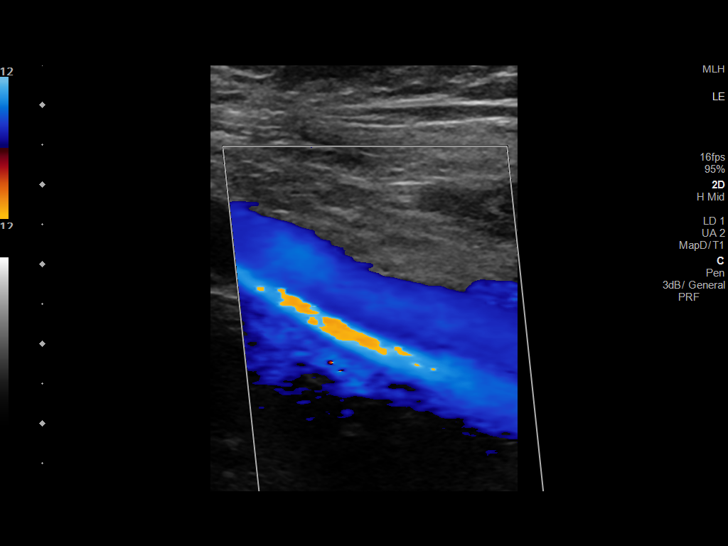
[im 14/33]
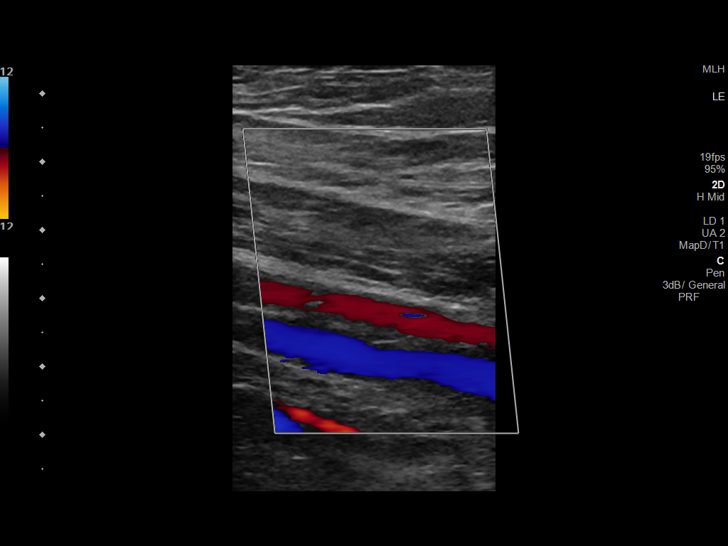
[im 17/33]
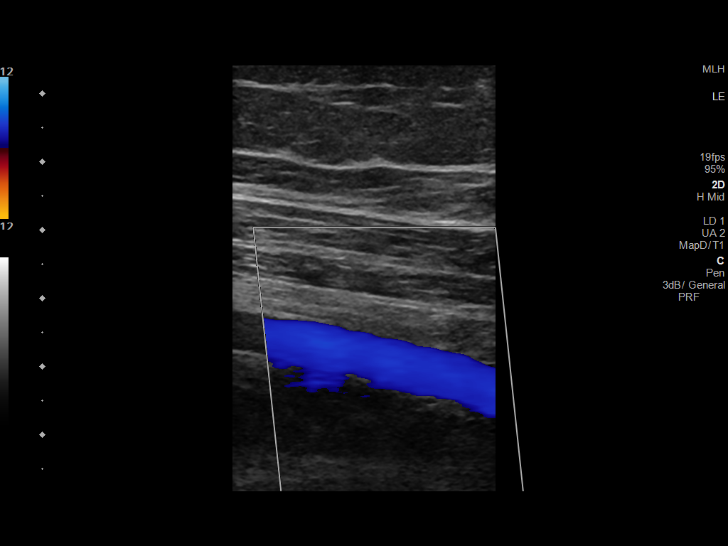
[im 19/33]
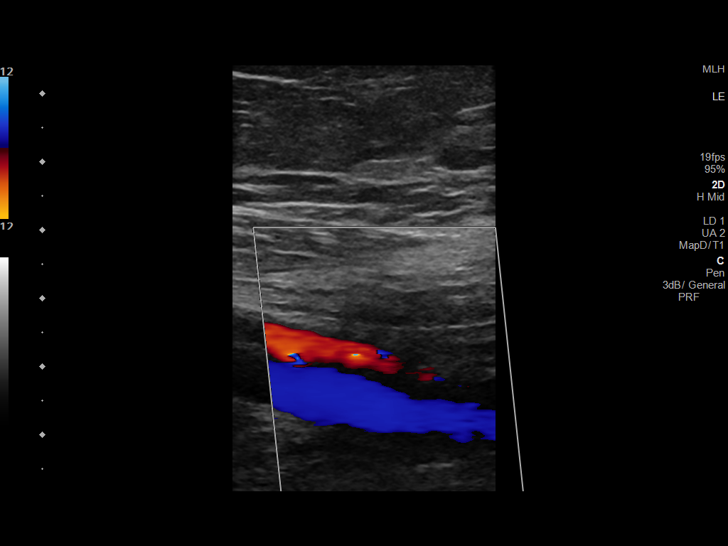
[im 21/33]
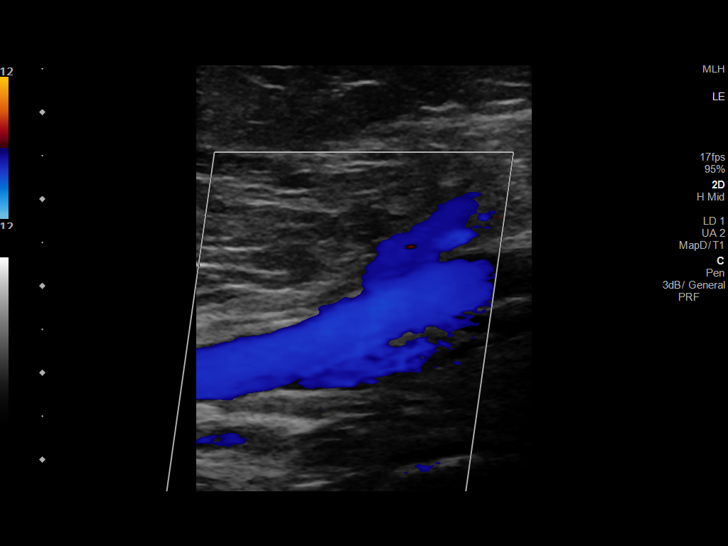
[im 24/33]
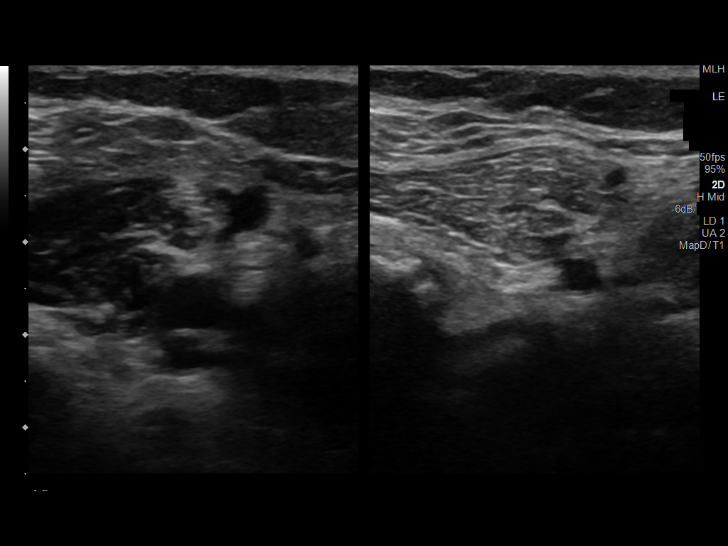
[im 27/33]
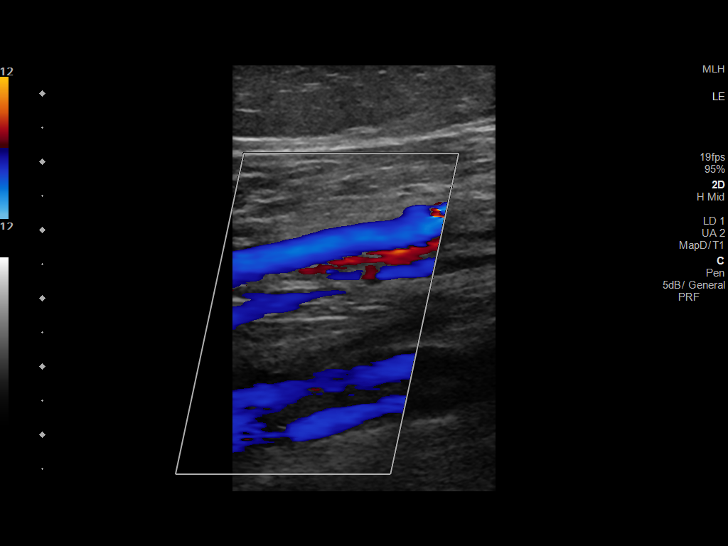
[im 30/33]
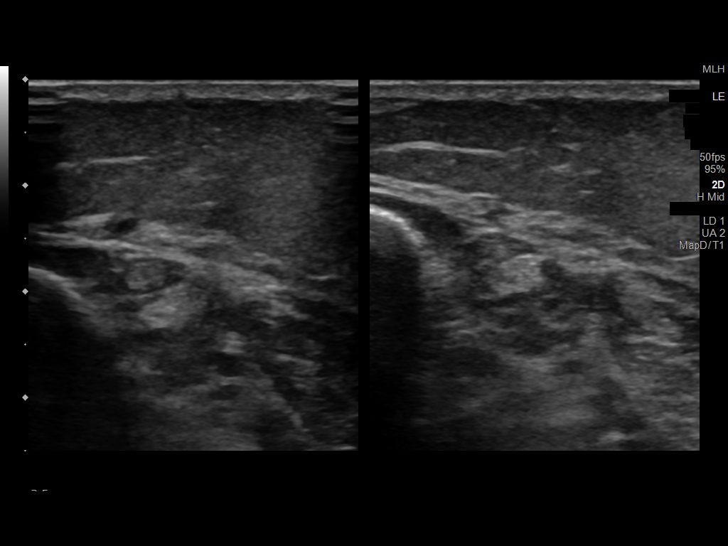
[im 33/33]
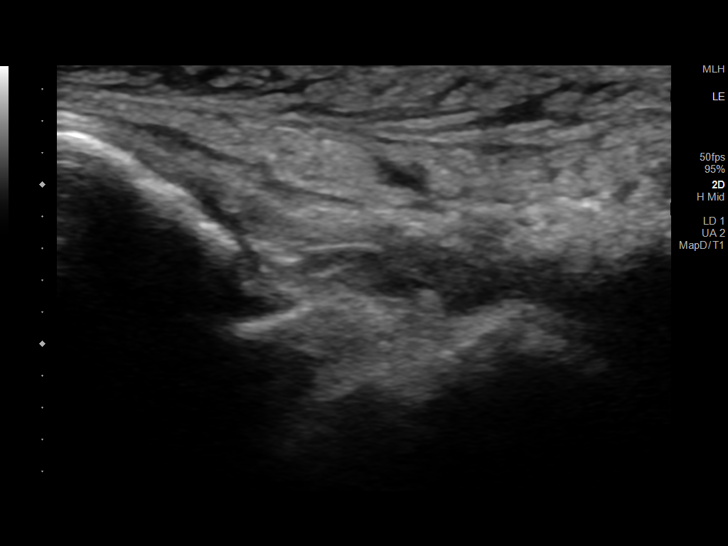

[13 of 24 positions shown; findings below may reference images not displayed]

FINDINGS: Contralateral Common Femoral Vein: Respiratory phasicity is normal
and symmetric with the symptomatic side. No evidence of thrombus.
Normal compressibility.

Common Femoral Vein: No evidence of thrombus. Normal
compressibility, respiratory phasicity and response to augmentation.

Saphenofemoral Junction: No evidence of thrombus. Normal
compressibility and flow on color Doppler imaging.

Profunda Femoral Vein: No evidence of thrombus. Normal
compressibility and flow on color Doppler imaging.

Femoral Vein: No evidence of thrombus. Normal compressibility,
respiratory phasicity and response to augmentation.

Popliteal Vein: No evidence of thrombus. Normal compressibility,
respiratory phasicity and response to augmentation.

Calf Veins: No evidence of thrombus. Normal compressibility and flow
on color Doppler imaging.

Superficial Great Saphenous Vein: No evidence of thrombus. Normal
compressibility.

Venous Reflux:  None.

Other Findings:  None.
IMPRESSION: No evidence of deep venous thrombosis.

## 2020-07-08 ENCOUNTER — Other Ambulatory Visit: Payer: Self-pay

## 2020-07-08 ENCOUNTER — Encounter: Payer: Self-pay | Admitting: Physical Therapy

## 2020-07-08 ENCOUNTER — Ambulatory Visit: Payer: Managed Care, Other (non HMO) | Admitting: Physical Therapy

## 2020-07-08 DIAGNOSIS — M25571 Pain in right ankle and joints of right foot: Secondary | ICD-10-CM

## 2020-07-08 DIAGNOSIS — M25671 Stiffness of right ankle, not elsewhere classified: Secondary | ICD-10-CM

## 2020-07-08 DIAGNOSIS — R262 Difficulty in walking, not elsewhere classified: Secondary | ICD-10-CM

## 2020-07-09 ENCOUNTER — Other Ambulatory Visit: Payer: Self-pay | Admitting: Physician Assistant

## 2020-07-09 NOTE — Therapy (Signed)
[facility] Outpatient Rehabilitation [facility] [address] [place], [place], [address] Phone: [phone]   Fax:  [phone]  Physical Therapy Treatment  Patient Details  Name: MERCIE BALSLEY MRN: [id number] Date of Birth: [birth year] Referring Provider (PT): West Bali Persons PA   Encounter Date: 07/08/2020   PT End of Session - 07/09/20 1439    Visit Number 3    Number of Visits 12    Date for PT Re-Evaluation 07/22/20    Authorization Type [employer]    PT Start Time 1631    PT Stop Time 1712    PT Time Calculation (min) 41 min    Activity Tolerance Patient tolerated treatment well    Behavior During Therapy [facility] for tasks assessed/performed           Past Medical History:  Diagnosis Date  . Anxiety   . Asthma   . Chronic kidney disease    patient denies - 07/09/2019  . Complication of anesthesia    was slow to wake up with one of her surgeries  . Cough   . Headache    hx. migraines  . Hyperventilation   . Insomnia   . Pleurisy   . UTI (urinary tract infection)     Past Surgical History:  Procedure Laterality Date  . ABDOMINAL HYSTERECTOMY    . ANKLE FUSION Right 07/11/2019   Procedure: RIGHT TALONAVICULAR AND SUBTALAR FUSION;  Surgeon: Nadara Mustard, MD;  Location: [facility] OR;  Service: Orthopedics;  Laterality: Right;  . LUMBAR LAMINECTOMY N/A 07/04/2015   Procedure: Left L5-S1 MICRODISCECTOMY;  Surgeon: Kerrin Champagne, MD;  Location: [facility] OR;  Service: Orthopedics;  Laterality: N/A;  . LUMBAR SPINE SURGERY    . UTERINE FIBROID SURGERY    . VESICOVAGINAL FISTULA CLOSURE W/ TAH      There were no vitals filed for this visit.   Subjective Assessment - 07/08/20 1639    Subjective Patient feels it the most in her heel today. She reprots the pain can xhange. Overall she feels like it is imporving. The pain is not as intense. She is using compression socks whcih she feels lis helping the swelling. She has been working on desensitization.    Pertinent  History anxiety, lumbar laminectomy    How long can you stand comfortably? painful as soon as she stands    How long can you walk comfortably? can walk it is just painful; has to wear flats    Diagnostic tests MRI: end plate osteophytes with eviance of previous laminectomy    Patient Stated Goals walk with less pain    Currently in Pain? Yes    Pain Score 4     Pain Location Ankle    Pain Orientation Right;Lateral;Posterior    Pain Type Chronic pain    Pain Onset More than a month ago    Pain Frequency Constant    Aggravating Factors  standing and walking    Pain Relieving Factors ice can help at times    Effect of Pain on Daily Activities difficulty perfroming ADL's    Multiple Pain Sites No                             [facility] Adult PT Treatment/Exercise - 07/09/20 0001      Manual Therapy   Soft tissue mobilization Gentle STM and scar mobilization within tolerance. Pt with TTP along all scars and R foot overall. Gastroc release  Passive ROM Gentle PROM in all planes within tolerance      Ankle Exercises: Standing   Other Standing Ankle Exercises Step up x20 forward 4 inch; side step x20 4 inch    Other Standing Ankle Exercises Heel and toe raises - rocking back and forth x 15 each direction      Ankle Exercises: Seated   Other Seated Ankle Exercises Mirror box: ankle DF/PF 2x20; EV/IV x20; left leg hool press into stoll x20 heel bounce x20; all with left leg while watching mirror                  PT Education - 07/09/20 1438    Education Details reviewed exerciseds and stretching. Reviewed theory behind mirror therapy    Person(s) Educated Patient    Methods Explanation;Demonstration;Tactile cues;Verbal cues    Comprehension Returned demonstration;Tactile cues required;Verbal cues required;Verbalized understanding            PT Short Term Goals - 07/01/20 1656      PT SHORT TERM GOAL #1   Title Patient willl be independent with  desensitization program    Time 3    Period Weeks    Status Achieved    Target Date 07/01/20      PT SHORT TERM GOAL #2   Title Patient will report a 50% decrease in burning and aching pain    Baseline 07/01/2020: Pt reports 25% decrease in burning and aching pain    Time 3    Period Weeks    Status On-going    Target Date 07/01/20      PT SHORT TERM GOAL #3   Title Patient will demonstrate 5/5 gross right LE strength    Time 3    Period Weeks    Status Achieved    Target Date 07/01/20             PT Long Term Goals - 06/10/20 0831      PT LONG TERM GOAL #1   Title Patient will wear whatever shoes she wants without increased pain    Time 6    Period Weeks    Status New    Target Date 07/22/20      PT LONG TERM GOAL #2   Title Patient will go up and down steps without pain    Time 6    Period Weeks    Status New    Target Date 07/22/20      PT LONG TERM GOAL #3   Title Patient will go shopping without increased foot pain    Time 6    Period Weeks    Status New    Target Date 07/22/20                 Plan - 07/09/20 1439    Clinical Impression Statement Patient reported her foot nd ankle felt much looser after her treatment. She was able to tolerate exercises well. She continues to report stairs continue to give her difficulty so we bgsan stair training. We also continue to work on Ship broker therapy. Her rnage of motion appears to be improving per visual inspection. We will continue to progress as tolerated.    Personal Factors and Comorbidities Comorbidity 1;Comorbidity 2    Comorbidities anxiety, ankle fusion    Examination-Activity Limitations Stairs;Squat;Transfers;Locomotion Level    Examination-Participation Restrictions Church;Community Activity;Cleaning;School    Stability/Clinical Decision Making Evolving/Moderate complexity    Clinical Decision Making Moderate    Rehab Potential Good  PT Frequency 2x / week    PT Duration 6 weeks    PT  Treatment/Interventions ADLs/Self Care Home Management;Electrical Stimulation;Cryotherapy;Iontophoresis 4mg /ml Dexamethasone;Moist Heat;Ultrasound;Gait training;DME Instruction;Functional mobility training;Therapeutic exercise;Therapeutic activities;Neuromuscular re-education;Patient/family education;Manual techniques;Passive range of motion;Taping;Dry needling    PT Next Visit Plan Utilize mirror box therapy; continue desnsitzation; light ther-ex. Gneral LE strengthening to tolerance.    PT Home Exercise Plan densensitization, gentle ankle DF    Consulted and Agree with Plan of Care Patient           Patient will benefit from skilled therapeutic intervention in order to improve the following deficits and impairments:  Difficulty walking,Abnormal gait,Increased muscle spasms,Decreased scar mobility,Impaired sensation,Decreased mobility,Decreased strength  Visit Diagnosis: Pain in right ankle and joints of right foot  Difficulty in walking, not elsewhere classified  Stiffness of right ankle, not elsewhere classified     Problem List Patient Active Problem List   Diagnosis Date Noted  . H/O ankle fusion 07/18/2019  . Posterior tibial tendinitis, right leg   . Sinusitis 04/10/2018  . Asthma exacerbation 03/28/2018  . SOB (shortness of breath)   . Pyelonephritis 04/15/2016  . Normochromic normocytic anemia 04/15/2016  . Elevated LFTs 04/15/2016  . HNP (herniated nucleus pulposus), lumbar 07/04/2015    Class: Acute  . Anxiety state 08/08/2013  . INSOMNIA 05/20/2008  . Asthma with bronchitis 05/15/2007  . PLEURISY 05/15/2007  . HYPERVENTILATION 05/15/2007  . COUGH 05/15/2007    05/17/2007  PT DPT  07/09/2020, 4:40 PM  [facility] [address] [place], [place], [place] Phone: [phone]   Fax:  [phone]  Name: JAMARIYA DAVIDOFF MRN: Tera Helper Date of Birth: 1961-03-12

## 2020-07-15 ENCOUNTER — Ambulatory Visit: Payer: Managed Care, Other (non HMO) | Admitting: Physical Therapy

## 2020-07-22 ENCOUNTER — Encounter: Payer: Self-pay | Admitting: Physical Therapy

## 2020-07-22 ENCOUNTER — Ambulatory Visit: Payer: Managed Care, Other (non HMO) | Admitting: Physical Therapy

## 2020-07-22 ENCOUNTER — Other Ambulatory Visit: Payer: Self-pay

## 2020-07-22 DIAGNOSIS — M25571 Pain in right ankle and joints of right foot: Secondary | ICD-10-CM

## 2020-07-22 DIAGNOSIS — M25671 Stiffness of right ankle, not elsewhere classified: Secondary | ICD-10-CM

## 2020-07-22 DIAGNOSIS — R262 Difficulty in walking, not elsewhere classified: Secondary | ICD-10-CM

## 2020-07-23 NOTE — Therapy (Signed)
[facility] [address] [place], [place], [address] Phone: [phone]   Fax:  [phone]  Physical Therapy Treatment  Patient Details  Name: [name] MRN: [id number] Date of Birth: 01/31/1961 Referring Provider (PT): West Bali Persons PA   Encounter Date: 07/22/2020   PT End of Session - 07/23/20 1303    Visit Number 4    Number of Visits 10    Date for PT Re-Evaluation 09/03/20    Authorization Type [employer]    PT Start Time 1630    PT Stop Time 1712    PT Time Calculation (min) 42 min    Activity Tolerance Patient tolerated treatment well    Behavior During Therapy [facility] for tasks assessed/performed           Past Medical History:  Diagnosis Date  . Anxiety   . Asthma   . Chronic kidney disease    patient denies - 07/09/2019  . Complication of anesthesia    was slow to wake up with one of her surgeries  . Cough   . Headache    hx. migraines  . Hyperventilation   . Insomnia   . Pleurisy   . UTI (urinary tract infection)     Past Surgical History:  Procedure Laterality Date  . ABDOMINAL HYSTERECTOMY    . ANKLE FUSION Right 07/11/2019   Procedure: RIGHT TALONAVICULAR AND SUBTALAR FUSION;  Surgeon: Nadara Mustard, MD;  Location: [facility] OR;  Service: Orthopedics;  Laterality: Right;  . LUMBAR LAMINECTOMY N/A 07/04/2015   Procedure: Left L5-S1 MICRODISCECTOMY;  Surgeon: Kerrin Champagne, MD;  Location: [facility] OR;  Service: Orthopedics;  Laterality: N/A;  . LUMBAR SPINE SURGERY    . UTERINE FIBROID SURGERY    . VESICOVAGINAL FISTULA CLOSURE W/ TAH      There were no vitals filed for this visit.   Subjective Assessment - 07/22/20 1635    Subjective Patient has had some pain in her heel but over all it has been better. She had a lot of pain on Friuday but it went away. She has been able to use her stepper now daily for weeks    Pertinent History anxiety, lumbar laminectomy    How long can you stand comfortably? painful as  soon as she stands    How long can you walk comfortably? can walk it is just painful; has to wear flats    Diagnostic tests MRI: end plate osteophytes with eviance of previous laminectomy    Currently in Pain? Yes    Pain Score 5     Pain Location Heel    Pain Orientation Right    Pain Descriptors / Indicators Aching    Pain Type Chronic pain    Pain Onset More than a month ago    Pain Frequency Constant    Aggravating Factors  standing and walking    Pain Relieving Factors ice can help at times    Effect of Pain on Daily Activities difficulty perfroming ADL's              [facility] PT Assessment - 07/23/20 0001      AROM   Right Ankle Dorsiflexion 15                         [facility] Adult PT Treatment/Exercise - 07/23/20 0001      Manual Therapy   Soft tissue mobilization Gentle STM and scar mobilization within tolerance. Pt with  TTP along all scars and R foot overall. Gastroc release    Passive ROM Gentle PROM in all planes within tolerance      Ankle Exercises: Standing   Other Standing Ankle Exercises Step up x20 forward 4 inch; side step x20 4 inch    Other Standing Ankle Exercises Heel and toe raises - rocking back and forth x 15 each direction; slow march x15 each leg with minimal UE support; Air-ex tandem stance eyes closed; step and hold onto air-ex x10      Ankle Exercises: Seated   Other Seated Ankle Exercises Mirror box: ankle DF/PF 2x20; EV/IV x20; left leg hool press into stoll x20 heel bounce x20; all with left leg while watching mirror                  PT Education - 07/23/20 1302    Education Details reviewed gastroic self soft tissu mobilzation    Person(s) Educated Patient    Methods Explanation;Demonstration;Tactile cues;Verbal cues    Comprehension Verbalized understanding;Returned demonstration;Verbal cues required;Tactile cues required            PT Short Term Goals - 07/01/20 1656      PT SHORT TERM GOAL #1   Title Patient  willl be independent with desensitization program    Time 3    Period Weeks    Status Achieved    Target Date 07/01/20      PT SHORT TERM GOAL #2   Title Patient will report a 50% decrease in burning and aching pain    Baseline 07/01/2020: Pt reports 25% decrease in burning and aching pain    Time 3    Period Weeks    Status On-going    Target Date 07/01/20      PT SHORT TERM GOAL #3   Title Patient will demonstrate 5/5 gross right LE strength    Time 3    Period Weeks    Status Achieved    Target Date 07/01/20             PT Long Term Goals - 06/10/20 0831      PT LONG TERM GOAL #1   Title Patient will wear whatever shoes she wants without increased pain    Time 6    Period Weeks    Status New    Target Date 07/22/20      PT LONG TERM GOAL #2   Title Patient will go up and down steps without pain    Time 6    Period Weeks    Status New    Target Date 07/22/20      PT LONG TERM GOAL #3   Title Patient will go shopping without increased foot pain    Time 6    Period Weeks    Status New    Target Date 07/22/20                 Plan - 07/23/20 1303    Clinical Impression Statement Patient is making good progress. At this time it appears that her problem is more of a standerd palntar facititus. She has pain in the medial calceneal trubricle area that is worse when she walsk and when it stiffens up for a while. Her passive motionDF has improved. She continues to have single leg instability ont he right but it is imprpving. At this time she would benefit from skilled therapy 1W6 to continue to work on single leg stability and strengthening. Today  we continued with manual therapy to the gastroc and calcaneal turbricle. We also worked on standing weight shifting and single leg stability activity. She would benefit from further skilled therapy to continue to progress gains in functional mobility.    Personal Factors and Comorbidities Comorbidity 1;Comorbidity 2     Comorbidities anxiety, ankle fusion    Examination-Activity Limitations Stairs;Squat;Transfers;Locomotion Level    Examination-Participation Restrictions Church;Community Activity;Cleaning;School    Stability/Clinical Decision Making Evolving/Moderate complexity    Clinical Decision Making Moderate    Rehab Potential Good    PT Frequency 2x / week    PT Duration 6 weeks    PT Treatment/Interventions ADLs/Self Care Home Management;Electrical Stimulation;Cryotherapy;Iontophoresis 4mg /ml Dexamethasone;Moist Heat;Ultrasound;Gait training;DME Instruction;Functional mobility training;Therapeutic exercise;Therapeutic activities;Neuromuscular re-education;Patient/family education;Manual techniques;Passive range of motion;Taping;Dry needling    PT Next Visit Plan Utilize mirror box therapy; continue desnsitzation; light ther-ex. Gneral LE strengthening to tolerance.    PT Home Exercise Plan densensitization, gentle ankle DF           Patient will benefit from skilled therapeutic intervention in order to improve the following deficits and impairments:  Difficulty walking,Abnormal gait,Increased muscle spasms,Decreased scar mobility,Impaired sensation,Decreased mobility,Decreased strength  Visit Diagnosis: Pain in right ankle and joints of right foot  Difficulty in walking, not elsewhere classified  Stiffness of right ankle, not elsewhere classified     Problem List Patient Active Problem List   Diagnosis Date Noted  . H/O ankle fusion 07/18/2019  . Posterior tibial tendinitis, right leg   . Sinusitis 04/10/2018  . Asthma exacerbation 03/28/2018  . SOB (shortness of breath)   . Pyelonephritis 04/15/2016  . Normochromic normocytic anemia 04/15/2016  . Elevated LFTs 04/15/2016  . HNP (herniated nucleus pulposus), lumbar 07/04/2015    Class: Acute  . Anxiety state 08/08/2013  . INSOMNIA 05/20/2008  . Asthma with bronchitis 05/15/2007  . PLEURISY 05/15/2007  . HYPERVENTILATION  05/15/2007  . COUGH 05/15/2007    05/17/2007 PT DPT  07/23/2020, 1:12 PM  [facility] [address] [place], [place], [place] Phone: [phone]   Fax:  [phone]  Name: [name] MRN: Tera Helper Date of Birth: 01/14/1961

## 2020-07-29 ENCOUNTER — Ambulatory Visit: Payer: Managed Care, Other (non HMO) | Admitting: Physician Assistant

## 2020-07-29 ENCOUNTER — Other Ambulatory Visit: Payer: Self-pay

## 2020-07-29 ENCOUNTER — Ambulatory Visit: Payer: Managed Care, Other (non HMO) | Attending: Physician Assistant

## 2020-07-29 ENCOUNTER — Encounter: Payer: Self-pay | Admitting: Physician Assistant

## 2020-07-29 DIAGNOSIS — M25571 Pain in right ankle and joints of right foot: Secondary | ICD-10-CM | POA: Insufficient documentation

## 2020-07-29 DIAGNOSIS — R262 Difficulty in walking, not elsewhere classified: Secondary | ICD-10-CM | POA: Insufficient documentation

## 2020-07-29 DIAGNOSIS — M25671 Stiffness of right ankle, not elsewhere classified: Secondary | ICD-10-CM | POA: Insufficient documentation

## 2020-07-29 DIAGNOSIS — M79671 Pain in right foot: Secondary | ICD-10-CM

## 2020-07-29 NOTE — Progress Notes (Signed)
Office Visit Note   Patient: [name]           Date of Birth: 15-May-1961           MRN: [id number] Visit Date: 07/29/2020              Requested by: [name], MD [address] [place],  [place] [address] PCP: [name], MD  Chief Complaint  Patient presents with  . 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  She has been working with physical therapy and she thinks it is really helping.  She also got some inserts for flat feet.  She is also wearing compression socks.  She still has some discomfort especially in the posterior aspect of her ankle but over follow-up is improving  Assessment & Plan: Visit Diagnoses: No diagnosis found.  Plan: She will follow up in 1 month.  Follow-Up Instructions: No follow-ups on file.   Ortho Exam  Patient is alert, oriented, no adenopathy, well-dressed, normal affect, normal respiratory effort. Focused examination demonstrates well-healed surgical incision.  She does tolerate me touching her foot shoe.  She has fairly painless range of motion still some pain with manipulation of the subtalar joint.  Compartments are soft and nontender no evidence of cellulitis  Imaging: No results found. No images are attached to the encounter.  Labs: Lab Results  Component Value Date   HGBA1C  05/14/2008    5.6 (NOTE)   The ADA recommends the following therapeutic goal for glycemic   control related to Hgb A1C measurement:   Goal of Therapy:   < 7.0% Hgb A1C   Reference: American Diabetes Association: Clinical Practice   Recommendations 2008, Diabetes Care,  2008, 31:(Suppl 1).   ESRSEDRATE 11 03/15/2019   LABURIC 6.4 03/15/2019   REPTSTATUS 04/20/2016 FINAL 04/15/2016   REPTSTATUS 04/20/2016 FINAL 04/15/2016   CULT  04/15/2016    NO GROWTH 5 DAYS Performed at [facility]    CULT  04/15/2016    NO GROWTH 5 DAYS Performed at [facility]    [facility] ESCHERICHIA COLI (A) 04/09/2016     Lab Results  Component Value Date   ALBUMIN 3.2 (L) 04/18/2016   ALBUMIN 3.2 (L) 04/16/2016   ALBUMIN 3.3 (L) 04/15/2016   LABURIC 6.4 03/15/2019    Lab Results  Component Value Date   MG 1.9 04/18/2016   MG 2.0 04/16/2016   MG 2.1 05/16/2008   No results found for: VD25OH  No results found for: PREALBUMIN CBC EXTENDED Latest Ref Rng & Units 07/11/2019 03/15/2019 04/10/2018  WBC 4.0 - 10.5 K/uL 5.2 4.3 6.4  RBC 3.87 - 5.11 MIL/uL 4.04 4.19 4.30  HGB 12.0 - 15.0 g/dL 11.7(L) 11.9 12.5  HCT 36.0 - 46.0 % 35.8(L) 36.0 37.2  PLT 150 - 400 K/uL 180 232 188.0  NEUTROABS 1.4 - 7.7 K/uL - - 4.2  LYMPHSABS 0.7 - 4.0 K/uL - - 1.9     There is no height or weight on file to calculate BMI.  Orders:  No orders of the defined types were placed in this encounter.  No orders of the defined types were placed in this encounter.    Procedures: No procedures performed  Clinical Data: No additional findings.  ROS:  All other systems negative, except as noted in the HPI. Review of Systems  Objective: Vital Signs: There were no  vitals taken for this visit.  Specialty Comments:  No specialty comments available.  PMFS History: Patient Active Problem List   Diagnosis Date Noted  . H/O ankle fusion 07/18/2019  . Posterior tibial tendinitis, right leg   . Sinusitis 04/10/2018  . Asthma exacerbation 03/28/2018  . SOB (shortness of breath)   . Pyelonephritis 04/15/2016  . Normochromic normocytic anemia 04/15/2016  . Elevated LFTs 04/15/2016  . HNP (herniated nucleus pulposus), lumbar 07/04/2015    Class: Acute  . Anxiety state 08/08/2013  . INSOMNIA 05/20/2008  . Asthma with bronchitis 05/15/2007  . PLEURISY 05/15/2007  . HYPERVENTILATION 05/15/2007  . COUGH 05/15/2007   Past Medical History:  Diagnosis Date  . Anxiety   . Asthma   . Chronic kidney disease    patient denies - 07/09/2019  . Complication of  anesthesia    was slow to wake up with one of her surgeries  . Cough   . Headache    hx. migraines  . Hyperventilation   . Insomnia   . Pleurisy   . UTI (urinary tract infection)     Family History  Problem Relation Age of Onset  . Prostate cancer Father   . Allergic rhinitis Mother   . Breast cancer Mother 92  . Angioedema Neg Hx   . Eczema Neg Hx   . Urticaria Neg Hx   . Asthma Neg Hx     Past Surgical History:  Procedure Laterality Date  . ABDOMINAL HYSTERECTOMY    . ANKLE FUSION Right 07/11/2019   Procedure: RIGHT TALONAVICULAR AND SUBTALAR FUSION;  Surgeon: Nadara Mustard, MD;  Location: [facility] OR;  Service: Orthopedics;  Laterality: Right;  . LUMBAR LAMINECTOMY N/A 07/04/2015   Procedure: Left L5-S1 MICRODISCECTOMY;  Surgeon: Kerrin Champagne, MD;  Location: [facility] OR;  Service: Orthopedics;  Laterality: N/A;  . LUMBAR SPINE SURGERY    . UTERINE FIBROID SURGERY    . VESICOVAGINAL FISTULA CLOSURE W/ TAH     Social History   Occupational History  . Occupation: marketing/sales  Tobacco Use  . Smoking status: Never Smoker  . Smokeless tobacco: Never Used  Vaping Use  . Vaping Use: Never used  Substance and Sexual Activity  . Alcohol use: No  . Drug use: No  . Sexual activity: Not on file

## 2020-07-30 NOTE — Therapy (Signed)
[facility] [address] [place], [place], [address] Phone: [phone]   Fax:  [phone]  Physical Therapy Treatment  Patient Details  Name: [name] MRN: [id number] Date of Birth: 1961-05-22 Referring Provider (PT): West Bali Persons PA   Encounter Date: 07/29/2020   PT End of Session - 07/29/20 1700    Visit Number 5    Number of Visits 10    Date for PT Re-Evaluation 09/03/20    Authorization Type [employer]    PT Start Time 1700    PT Stop Time 1743    PT Time Calculation (min) 43 min    Activity Tolerance Patient tolerated treatment well    Behavior During Therapy [facility] for tasks assessed/performed           Past Medical History:  Diagnosis Date  . Anxiety   . Asthma   . Chronic kidney disease    patient denies - 07/09/2019  . Complication of anesthesia    was slow to wake up with one of her surgeries  . Cough   . Headache    hx. migraines  . Hyperventilation   . Insomnia   . Pleurisy   . UTI (urinary tract infection)     Past Surgical History:  Procedure Laterality Date  . ABDOMINAL HYSTERECTOMY    . ANKLE FUSION Right 07/11/2019   Procedure: RIGHT TALONAVICULAR AND SUBTALAR FUSION;  Surgeon: Nadara Mustard, MD;  Location: [facility] OR;  Service: Orthopedics;  Laterality: Right;  . LUMBAR LAMINECTOMY N/A 07/04/2015   Procedure: Left L5-S1 MICRODISCECTOMY;  Surgeon: Kerrin Champagne, MD;  Location: [facility] OR;  Service: Orthopedics;  Laterality: N/A;  . LUMBAR SPINE SURGERY    . UTERINE FIBROID SURGERY    . VESICOVAGINAL FISTULA CLOSURE W/ TAH      There were no vitals filed for this visit.   Subjective Assessment - 07/29/20 1703    Subjective "I do 45 minutes on the stepper every day. I got a vibration machine for my feet and ordered a handheld massage gun. I am doing everything I can."    Pertinent History anxiety, lumbar laminectomy    How long can you stand comfortably? painful as soon as she stands    How long  can you walk comfortably? can walk it is just painful; has to wear flats    Diagnostic tests MRI: end plate osteophytes with eviance of previous laminectomy    Patient Stated Goals walk with less pain    Currently in Pain? Yes    Pain Score 4     Pain Location Heel    Pain Orientation Right    Pain Descriptors / Indicators Aching;Stabbing    Pain Type Chronic pain    Pain Radiating Towards lateral foot and heel    Pain Onset More than a month ago              [facility] PT Assessment - 07/29/20 0001      Assessment   Medical Diagnosis Right Foot Pain    Referring Provider (PT) West Bali Persons PA                         [facility] Adult PT Treatment/Exercise - 07/29/20 0001      Self-Care   Self-Care Other Self-Care Comments    Other Self-Care Comments  Reviewed HEP, gastroc and soleus stretches, tennis ball for STM along plantar aspect of R foot  Manual Therapy   Soft tissue mobilization Gentle STM and scar mobilization within tolerance. Pt with TTP along all scars and R foot overall. STM and roller along gastroc-soleus.    Passive ROM Gentle PROM in all planes within tolerance.      Ankle Exercises: Standing   SLS SLS 3 x 20 sec on each LE    Rocker Board Other (comment)   AP and ML x 15 in each direction   Other Standing Ankle Exercises Step up 2 x 15 with opposite knee drive (ascending with RLE) on 6" step. Squats with UE support at free motion 2 x 10    Other Standing Ankle Exercises Heel and toe raises rocking back and forth x 20 each direction. Slow alternating marches x 15 each LE while on airex.      Ankle Exercises: Stretches   Soleus Stretch 2 reps;30 seconds    Gastroc Stretch 2 reps;30 seconds      Ankle Exercises: Seated   Other Seated Ankle Exercises Pt returned demonstration of use of tennis ball along plantar fascia x 2 min                  PT Education - 07/30/20 0106    Education Details Reviewed and updated HEP, gastroc and  soleus stretches, tennis ball for STM along plantar aspect of R foot    Person(s) Educated Patient    Methods Explanation;Demonstration;Tactile cues;Verbal cues;Handout    Comprehension Verbalized understanding;Returned demonstration;Verbal cues required;Tactile cues required            PT Short Term Goals - 07/30/20 0122      PT SHORT TERM GOAL #1   Title Patient willl be independent with desensitization program    Time 3    Period Weeks    Status Achieved    Target Date 07/01/20      PT SHORT TERM GOAL #2   Title Patient will report a 50% decrease in burning and aching pain    Baseline 07/01/2020: Pt reports 25% decrease in burning and aching pain    Time 3    Period Weeks    Status On-going    Target Date 07/01/20      PT SHORT TERM GOAL #3   Title Patient will demonstrate 5/5 gross right LE strength    Time 3    Period Weeks    Status Achieved    Target Date 07/01/20             PT Long Term Goals - 07/30/20 0123      PT LONG TERM GOAL #1   Title Patient will wear whatever shoes she wants without increased pain    Time 6    Period Weeks    Status On-going      PT LONG TERM GOAL #2   Title Patient will go up and down steps without pain    Time 6    Period Weeks    Status On-going      PT LONG TERM GOAL #3   Title Patient will go shopping without increased foot pain    Time 6    Period Weeks    Status On-going                 Plan - 07/30/20 0116    Clinical Impression Statement Patient was able to progress standing and balance activities today with tolerable pain that did not limit her ability to complete interventions. She continues to have  TTP along medial calcaneal tubercle and lateral R foot scar. She will continue to benefit from skilled PT intervention to further improve functional mobility.    Personal Factors and Comorbidities Comorbidity 1;Comorbidity 2    Comorbidities anxiety, ankle fusion    Examination-Activity Limitations  Stairs;Squat;Transfers;Locomotion Level    Examination-Participation Restrictions Church;Community Activity;Cleaning;School    Stability/Clinical Decision Making Evolving/Moderate complexity    Rehab Potential Good    PT Frequency 2x / week    PT Duration 6 weeks    PT Treatment/Interventions ADLs/Self Care Home Management;Electrical Stimulation;Cryotherapy;Iontophoresis 4mg /ml Dexamethasone;Moist Heat;Ultrasound;Gait training;DME Instruction;Functional mobility training;Therapeutic exercise;Therapeutic activities;Neuromuscular re-education;Patient/family education;Manual techniques;Passive range of motion;Taping;Dry needling    PT Next Visit Plan FOTO - Utilize mirror box therapy as needed; continue desnsitzation; light ther-ex. General LE strengthening to tolerance. Single leg activities as able    PT Home Exercise Plan [id number] - densensitization, gentle ankle DF    Consulted and Agree with Plan of Care Patient           Patient will benefit from skilled therapeutic intervention in order to improve the following deficits and impairments:  Difficulty walking,Abnormal gait,Increased muscle spasms,Decreased scar mobility,Impaired sensation,Decreased mobility,Decreased strength  Visit Diagnosis: Pain in right ankle and joints of right foot  Difficulty in walking, not elsewhere classified  Stiffness of right ankle, not elsewhere classified     Problem List Patient Active Problem List   Diagnosis Date Noted  . H/O ankle fusion 07/18/2019  . Posterior tibial tendinitis, right leg   . Sinusitis 04/10/2018  . Asthma exacerbation 03/28/2018  . SOB (shortness of breath)   . Pyelonephritis 04/15/2016  . Normochromic normocytic anemia 04/15/2016  . Elevated LFTs 04/15/2016  . HNP (herniated nucleus pulposus), lumbar 07/04/2015    Class: Acute  . Anxiety state 08/08/2013  . INSOMNIA 05/20/2008  . Asthma with bronchitis 05/15/2007  . PLEURISY 05/15/2007  . HYPERVENTILATION 05/15/2007   . COUGH 05/15/2007      05/17/2007, PT, DPT 07/30/20 1:25 AM    [facility] [address] [place], [place], [place] Phone: [phone]   Fax:  [phone]  Name: [name] MRN: Tera Helper Date of Birth: [birth year]

## 2020-08-02 ENCOUNTER — Other Ambulatory Visit: Payer: Self-pay | Admitting: Physician Assistant

## 2020-08-04 ENCOUNTER — Other Ambulatory Visit: Payer: Self-pay | Admitting: Orthopedic Surgery

## 2020-08-12 ENCOUNTER — Other Ambulatory Visit: Payer: Self-pay

## 2020-08-12 ENCOUNTER — Ambulatory Visit: Payer: Managed Care, Other (non HMO) | Admitting: Rehabilitative and Restorative Service Providers"

## 2020-08-12 ENCOUNTER — Encounter: Payer: Self-pay | Admitting: Rehabilitative and Restorative Service Providers"

## 2020-08-12 DIAGNOSIS — M25671 Stiffness of right ankle, not elsewhere classified: Secondary | ICD-10-CM

## 2020-08-12 DIAGNOSIS — M25571 Pain in right ankle and joints of right foot: Secondary | ICD-10-CM

## 2020-08-12 DIAGNOSIS — R262 Difficulty in walking, not elsewhere classified: Secondary | ICD-10-CM

## 2020-08-12 NOTE — Therapy (Signed)
[facility] [address] [place], [place], [address] Phone: [phone]   Fax:  [phone]  Physical Therapy Treatment  Patient Details  Name: [name] MRN: [id number] Date of Birth: 1961/06/18 Referring Provider (PT): West Bali Persons PA   Encounter Date: 08/12/2020   PT End of Session - 08/12/20 1656    Visit Number 6    Number of Visits 10    PT Start Time 0438    PT Stop Time 0520    PT Time Calculation (min) 42 min    Activity Tolerance Patient tolerated treatment well;No increased pain    Behavior During Therapy WFL for tasks assessed/performed           Past Medical History:  Diagnosis Date  . Anxiety   . Asthma   . Chronic kidney disease    patient denies - 07/09/2019  . Complication of anesthesia    was slow to wake up with one of her surgeries  . Cough   . Headache    hx. migraines  . Hyperventilation   . Insomnia   . Pleurisy   . UTI (urinary tract infection)     Past Surgical History:  Procedure Laterality Date  . ABDOMINAL HYSTERECTOMY    . ANKLE FUSION Right 07/11/2019   Procedure: RIGHT TALONAVICULAR AND SUBTALAR FUSION;  Surgeon: Nadara Mustard, MD;  Location: [facility] OR;  Service: Orthopedics;  Laterality: Right;  . LUMBAR LAMINECTOMY N/A 07/04/2015   Procedure: Left L5-S1 MICRODISCECTOMY;  Surgeon: Kerrin Champagne, MD;  Location: [facility] OR;  Service: Orthopedics;  Laterality: N/A;  . LUMBAR SPINE SURGERY    . UTERINE FIBROID SURGERY    . VESICOVAGINAL FISTULA CLOSURE W/ TAH      There were no vitals filed for this visit.   Subjective Assessment - 08/12/20 1640    Subjective Doing good. have to get back in my shoes.    Currently in Pain? Yes    Pain Score 3     Pain Location Heel    Pain Orientation Right    Pain Descriptors / Indicators Discomfort    Pain Type Chronic pain    Pain Onset More than a month ago    Multiple Pain Sites No                             [facility]  Adult PT Treatment/Exercise - 08/12/20 0001      Knee/Hip Exercises: Supine   Other Supine Knee/Hip Exercises 2 lb SLR R with concentration on eccentric control x 15; 2 lb SLR/hip abduct combo x 15 each; bridge bil with bil ankle DF x 20; R SL bridge x 10; Hamstring stretch unilat bil x 30 sec each      Manual Therapy   Soft tissue mobilization Gentle STM and scar mobilization within tolerance. Pt with tingling along all scars.    Passive ROM Gentle PROM in all planes within tolerance.      Ankle Exercises: Supine   Other Supine Ankle Exercises 5 toe splay x 15      Ankle Exercises: Standing   Other Standing Ankle Exercises SLS R x 1 min; L hamstring curl x 10 to increase R LE WB; R single limb toe raise x 20, heel raise x 20; squats x 20 with glute set; iso squat with alternating heel raise x 20; green theradisc R and L hip abduct x 20, hip ext x 20,  hip circles CW/CCW x 20 each; calf step stretch 2x30 sec each side; ankle inversion/PF stretch 1x30 sec each side      Ankle Exercises: Seated   Other Seated Ankle Exercises seated hamstring stretch unilat bil 2x30 sec      Ankle Exercises: Sidelying   Other Sidelying Ankle Exercises 2 lb inversion/eversion x 20 each R LE                    PT Short Term Goals - 08/12/20 1809      PT SHORT TERM GOAL #2   Title Patient will report a 50% decrease in burning and aching pain    Status On-going             PT Long Term Goals - 07/30/20 0123      PT LONG TERM GOAL #1   Title Patient will wear whatever shoes she wants without increased pain    Time 6    Period Weeks    Status On-going      PT LONG TERM GOAL #2   Title Patient will go up and down steps without pain    Time 6    Period Weeks    Status On-going      PT LONG TERM GOAL #3   Title Patient will go shopping without increased foot pain    Time 6    Period Weeks    Status On-going                 Plan - 08/12/20 1806    Clinical Impression  Statement Patient was able to progress standing and balance activities today without pain, only fatigue reported. She continues to have deficits in sensitization along medial calcaneal tubercle and lateral R foot scar. She will continue to benefit from skilled PT intervention to further improve functional mobility.    PT Treatment/Interventions ADLs/Self Care Home Management;Electrical Stimulation;Cryotherapy;Iontophoresis 4mg /ml Dexamethasone;Moist Heat;Ultrasound;Gait training;DME Instruction;Functional mobility training;Therapeutic exercise;Therapeutic activities;Neuromuscular re-education;Patient/family education;Manual techniques;Passive range of motion;Taping;Dry needling    PT Next Visit Plan FOTO - Utilize mirror box therapy as needed; continue densitization; light ther-ex. General LE strengthening to tolerance. Single leg activities as able    Consulted and Agree with Plan of Care Patient           Patient will benefit from skilled therapeutic intervention in order to improve the following deficits and impairments:  Difficulty walking,Abnormal gait,Increased muscle spasms,Decreased scar mobility,Impaired sensation,Decreased mobility,Decreased strength  Visit Diagnosis: Pain in right ankle and joints of right foot  Difficulty in walking, not elsewhere classified  Stiffness of right ankle, not elsewhere classified     Problem List Patient Active Problem List   Diagnosis Date Noted  . H/O ankle fusion 07/18/2019  . Posterior tibial tendinitis, right leg   . Sinusitis 04/10/2018  . Asthma exacerbation 03/28/2018  . SOB (shortness of breath)   . Pyelonephritis 04/15/2016  . Normochromic normocytic anemia 04/15/2016  . Elevated LFTs 04/15/2016  . HNP (herniated nucleus pulposus), lumbar 07/04/2015    Class: Acute  . Anxiety state 08/08/2013  . INSOMNIA 05/20/2008  . Asthma with bronchitis 05/15/2007  . PLEURISY 05/15/2007  . HYPERVENTILATION 05/15/2007  . COUGH 05/15/2007     05/17/2007 , PT 08/12/2020, 6:10 PM  [facility] [address] [place], [place], [place] Phone: [phone]   Fax:  [phone]  Name: LYNNOX GIRTEN MRN: Tera Helper Date of Birth: 19-Nov-1960

## 2020-08-19 ENCOUNTER — Ambulatory Visit: Payer: Managed Care, Other (non HMO) | Admitting: Physical Therapy

## 2020-08-19 ENCOUNTER — Other Ambulatory Visit: Payer: Self-pay

## 2020-08-19 ENCOUNTER — Encounter: Payer: Self-pay | Admitting: Physical Therapy

## 2020-08-19 DIAGNOSIS — M25571 Pain in right ankle and joints of right foot: Secondary | ICD-10-CM

## 2020-08-19 DIAGNOSIS — R262 Difficulty in walking, not elsewhere classified: Secondary | ICD-10-CM

## 2020-08-19 DIAGNOSIS — M25671 Stiffness of right ankle, not elsewhere classified: Secondary | ICD-10-CM

## 2020-08-20 ENCOUNTER — Encounter: Payer: Self-pay | Admitting: Physical Therapy

## 2020-08-20 NOTE — Therapy (Signed)
[facility] [address] [place], [place], [address] Phone: [phone]   Fax:  [phone]  Physical Therapy Treatment  Patient Details  Name: [name] MRN: [id number] Date of Birth: 12-08-1960 Referring Provider (PT): West Bali Persons PA   Encounter Date: 08/19/2020   PT End of Session - 08/19/20 1655    Visit Number 7    Number of Visits 10    Date for PT Re-Evaluation 09/03/20    Authorization Type [employer]    PT Start Time 1630    PT Stop Time 1713    PT Time Calculation (min) 43 min    Activity Tolerance Patient tolerated treatment well;No increased pain    Behavior During Therapy WFL for tasks assessed/performed           Past Medical History:  Diagnosis Date  . Anxiety   . Asthma   . Chronic kidney disease    patient denies - 07/09/2019  . Complication of anesthesia    was slow to wake up with one of her surgeries  . Cough   . Headache    hx. migraines  . Hyperventilation   . Insomnia   . Pleurisy   . UTI (urinary tract infection)     Past Surgical History:  Procedure Laterality Date  . ABDOMINAL HYSTERECTOMY    . ANKLE FUSION Right 07/11/2019   Procedure: RIGHT TALONAVICULAR AND SUBTALAR FUSION;  Surgeon: Nadara Mustard, MD;  Location: [facility] OR;  Service: Orthopedics;  Laterality: Right;  . LUMBAR LAMINECTOMY N/A 07/04/2015   Procedure: Left L5-S1 MICRODISCECTOMY;  Surgeon: Kerrin Champagne, MD;  Location: [facility] OR;  Service: Orthopedics;  Laterality: N/A;  . LUMBAR SPINE SURGERY    . UTERINE FIBROID SURGERY    . VESICOVAGINAL FISTULA CLOSURE W/ TAH      There were no vitals filed for this visit.   Subjective Assessment - 08/20/20 0835    Subjective Patient was more limited today by heel pain. We focused on manual therapy and standing stability exercises. She had improved movement and pain after manual therapy. Her pain is fcused aroudn the medial inserion of thew plantar facia at this time. She had  increased pain weight bearing today,  but this was a one day thing so far. she dosent rememebr doing anthything that may have caused the pain    Pertinent History anxiety, lumbar laminectomy    How long can you stand comfortably? painful as soon as she stands    How long can you walk comfortably? can walk it is just painful; has to wear flats    Diagnostic tests MRI: end plate osteophytes with eviance of previous laminectomy    Patient Stated Goals walk with less pain    Currently in Pain? Yes    Pain Score 5     Pain Location Foot    Pain Orientation Right    Pain Descriptors / Indicators Aching    Pain Type Chronic pain    Pain Onset More than a month ago    Pain Frequency Constant    Aggravating Factors  standing and walking    Pain Relieving Factors stretching and ice                             [facility] Adult PT Treatment/Exercise - 08/20/20 0001      Manual Therapy   Manual therapy comments great toe AP and PA glides  Soft tissue mobilization trigger point release to calf and medial palntar facia. TFM to plantar facia.    Passive ROM Gentle PROM in all planes within tolerance.      Ankle Exercises: Standing   Other Standing Ankle Exercises balance mat narrow base eyes opened and eyes closed 3x30 sec hold each; step onto air-ex and hold right forward and lateral x15; tandem stance 2x30 sec bilateral EO      Ankle Exercises: Seated   Other Seated Ankle Exercises LAQ red 2x20; hamstring curl 2x20 red                  PT Education - 08/20/20 1609    Education Details reviewed self soft tissue mobilization of plantar facia and posterior calf    Person(s) Educated Patient    Methods Explanation;Demonstration;Tactile cues;Verbal cues    Comprehension Verbalized understanding;Returned demonstration;Verbal cues required;Tactile cues required            PT Short Term Goals - 08/12/20 1809      PT SHORT TERM GOAL #2   Title Patient will report a 50%  decrease in burning and aching pain    Status On-going             PT Long Term Goals - 07/30/20 0123      PT LONG TERM GOAL #1   Title Patient will wear whatever shoes she wants without increased pain    Time 6    Period Weeks    Status On-going      PT LONG TERM GOAL #2   Title Patient will go up and down steps without pain    Time 6    Period Weeks    Status On-going      PT LONG TERM GOAL #3   Title Patient will go shopping without increased foot pain    Time 6    Period Weeks    Status On-going                 Plan - 08/20/20 1612    Clinical Impression Statement Patient was limited today by pain in her heel. She reports it just started uesterday and has continued. She had improved motion and pain with manual therapy. We worked more on static stability today on the air-ex. She reported minor pain. She was shown how to do self soft tissue mobilization at home.    Personal Factors and Comorbidities Comorbidity 1;Comorbidity 2    Comorbidities anxiety, ankle fusion    Examination-Activity Limitations Stairs;Squat;Transfers;Locomotion Level    Examination-Participation Restrictions Church;Community Activity;Cleaning;School    Stability/Clinical Decision Making Evolving/Moderate complexity    Clinical Decision Making Moderate    Rehab Potential Good    PT Frequency 2x / week    PT Duration 6 weeks    PT Treatment/Interventions ADLs/Self Care Home Management;Electrical Stimulation;Cryotherapy;Iontophoresis 4mg /ml Dexamethasone;Moist Heat;Ultrasound;Gait training;DME Instruction;Functional mobility training;Therapeutic exercise;Therapeutic activities;Neuromuscular re-education;Patient/family education;Manual techniques;Passive range of motion;Taping;Dry needling    PT Next Visit Plan FOTO - Utilize mirror box therapy as needed; continue densitization; light ther-ex. General LE strengthening to tolerance. Single leg activities as able    PT Home Exercise Plan [id number] -  densensitization, gentle ankle DF    Consulted and Agree with Plan of Care Patient           Patient will benefit from skilled therapeutic intervention in order to improve the following deficits and impairments:  Difficulty walking,Abnormal gait,Increased muscle spasms,Decreased scar mobility,Impaired sensation,Decreased mobility,Decreased strength  Visit Diagnosis: Pain in  right ankle and joints of right foot  Difficulty in walking, not elsewhere classified  Stiffness of right ankle, not elsewhere classified     Problem List Patient Active Problem List   Diagnosis Date Noted  . H/O ankle fusion 07/18/2019  . Posterior tibial tendinitis, right leg   . Sinusitis 04/10/2018  . Asthma exacerbation 03/28/2018  . SOB (shortness of breath)   . Pyelonephritis 04/15/2016  . Normochromic normocytic anemia 04/15/2016  . Elevated LFTs 04/15/2016  . HNP (herniated nucleus pulposus), lumbar 07/04/2015    Class: Acute  . Anxiety state 08/08/2013  . INSOMNIA 05/20/2008  . Asthma with bronchitis 05/15/2007  . PLEURISY 05/15/2007  . HYPERVENTILATION 05/15/2007  . COUGH 05/15/2007    Dessie Coma PT DPT  08/20/2020, 4:16 PM  [facility] [address] [place], [place], [address] Phone: [phone]   Fax:  [phone]  Name: [name] MRN: [id number] Date of Birth: 10-30-1960

## 2020-08-26 ENCOUNTER — Other Ambulatory Visit: Payer: Self-pay | Admitting: Physician Assistant

## 2020-08-26 ENCOUNTER — Ambulatory Visit: Payer: Managed Care, Other (non HMO) | Admitting: Physician Assistant

## 2020-08-26 ENCOUNTER — Encounter: Payer: Self-pay | Admitting: Orthopedic Surgery

## 2020-08-26 DIAGNOSIS — M79671 Pain in right foot: Secondary | ICD-10-CM

## 2020-08-26 NOTE — Progress Notes (Signed)
Office Visit Note   Patient: [name]           Date of Birth: 1961-04-25           MRN: [id number] Visit Date: 08/26/2020              Requested by: [name], MD [address] [place],  [place] [address] PCP: [name], MD  Chief Complaint  Patient presents with  . Right Foot - Follow-up    07/11/19 right talonavicular and subtalar fusion      HPI: Patient presents today for follow-up on her right foot.  She has status post right talonavicular and subtalar fusion which has been slow to heal.  Her recovery has been complicated by complex regional pain syndrome.  She is tried various modalities to even ketamine injections and sympathetic nerve blocks.  Most recently she has been wearing vive compression stocking and working with physical therapy.  She is doing much better.  Her only complaint is she has certain shoes with heels that when she wears them she says her foot is still painful and turns to the right  Assessment & Plan: Visit Diagnoses: No diagnosis found.  Plan: Patient will complete physical therapy and continue to work on proprioceptive strengthening.  She has actually brought a balance board.  I told her I would expect at some point she might be able to wear her heels for limited periods of time but for the most part should wear a supportive stiff shoe  Follow-Up Instructions: No follow-ups on file.   Ortho Exam  Patient is alert, oriented, no adenopathy, well-dressed, normal affect, normal respiratory effort. Focused examination demonstrates well-healed surgical incision she tolerates palpation without any difficulty no cellulitis no swelling she has good plantar flexion and dorsiflexion strength.  No signs of infection or cellulitis.  Imaging: No results found. No images are attached to the encounter.  Labs: Lab Results  Component Value Date   HGBA1C  05/14/2008    5.6 (NOTE)   The ADA recommends the following therapeutic goal  for glycemic   control related to Hgb A1C measurement:   Goal of Therapy:   < 7.0% Hgb A1C   Reference: American Diabetes Association: Clinical Practice   Recommendations 2008, Diabetes Care,  2008, 31:(Suppl 1).   ESRSEDRATE 11 03/15/2019   LABURIC 6.4 03/15/2019   REPTSTATUS 04/20/2016 FINAL 04/15/2016   REPTSTATUS 04/20/2016 FINAL 04/15/2016   CULT  04/15/2016    NO GROWTH 5 DAYS Performed at [facility]    CULT  04/15/2016    NO GROWTH 5 DAYS Performed at [facility]    [facility] ESCHERICHIA COLI (A) 04/09/2016     Lab Results  Component Value Date   ALBUMIN 3.2 (L) 04/18/2016   ALBUMIN 3.2 (L) 04/16/2016   ALBUMIN 3.3 (L) 04/15/2016   LABURIC 6.4 03/15/2019    Lab Results  Component Value Date   MG 1.9 04/18/2016   MG 2.0 04/16/2016   MG 2.1 05/16/2008   No results found for: VD25OH  No results found for: PREALBUMIN CBC EXTENDED Latest Ref Rng & Units 07/11/2019 03/15/2019 04/10/2018  WBC 4.0 - 10.5 K/uL 5.2 4.3 6.4  RBC 3.87 - 5.11 MIL/uL 4.04 4.19 4.30  HGB 12.0 - 15.0 g/dL 11.7(L) 11.9 12.5  HCT 36.0 - 46.0 % 35.8(L) 36.0 37.2  PLT 150 - 400 K/uL 180 232 188.0  NEUTROABS 1.4 - 7.7 K/uL - - 4.2  LYMPHSABS 0.7 -  4.0 K/uL - - 1.9     There is no height or weight on file to calculate BMI.  Orders:  No orders of the defined types were placed in this encounter.  No orders of the defined types were placed in this encounter.    Procedures: No procedures performed  Clinical Data: No additional findings.  ROS:  All other systems negative, except as noted in the HPI. Review of Systems  Objective: Vital Signs: There were no vitals taken for this visit.  Specialty Comments:  No specialty comments available.  PMFS History: Patient Active Problem List   Diagnosis Date Noted  . H/O ankle fusion 07/18/2019  . Posterior tibial tendinitis, right leg   . Sinusitis 04/10/2018  . Asthma exacerbation 03/28/2018  . SOB (shortness of breath)    . Pyelonephritis 04/15/2016  . Normochromic normocytic anemia 04/15/2016  . Elevated LFTs 04/15/2016  . HNP (herniated nucleus pulposus), lumbar 07/04/2015    Class: Acute  . Anxiety state 08/08/2013  . INSOMNIA 05/20/2008  . Asthma with bronchitis 05/15/2007  . PLEURISY 05/15/2007  . HYPERVENTILATION 05/15/2007  . COUGH 05/15/2007   Past Medical History:  Diagnosis Date  . Anxiety   . Asthma   . Chronic kidney disease    patient denies - 07/09/2019  . Complication of anesthesia    was slow to wake up with one of her surgeries  . Cough   . Headache    hx. migraines  . Hyperventilation   . Insomnia   . Pleurisy   . UTI (urinary tract infection)     Family History  Problem Relation Age of Onset  . Prostate cancer Father   . Allergic rhinitis Mother   . Breast cancer Mother 47  . Angioedema Neg Hx   . Eczema Neg Hx   . Urticaria Neg Hx   . Asthma Neg Hx     Past Surgical History:  Procedure Laterality Date  . ABDOMINAL HYSTERECTOMY    . ANKLE FUSION Right 07/11/2019   Procedure: RIGHT TALONAVICULAR AND SUBTALAR FUSION;  Surgeon: Nadara Mustard, MD;  Location: [facility] OR;  Service: Orthopedics;  Laterality: Right;  . LUMBAR LAMINECTOMY N/A 07/04/2015   Procedure: Left L5-S1 MICRODISCECTOMY;  Surgeon: Kerrin Champagne, MD;  Location: [facility] OR;  Service: Orthopedics;  Laterality: N/A;  . LUMBAR SPINE SURGERY    . UTERINE FIBROID SURGERY    . VESICOVAGINAL FISTULA CLOSURE W/ TAH     Social History   Occupational History  . Occupation: marketing/sales  Tobacco Use  . Smoking status: Never Smoker  . Smokeless tobacco: Never Used  Vaping Use  . Vaping Use: Never used  Substance and Sexual Activity  . Alcohol use: No  . Drug use: No  . Sexual activity: Not on file

## 2020-09-01 ENCOUNTER — Ambulatory Visit: Payer: Managed Care, Other (non HMO) | Attending: Physician Assistant

## 2020-09-01 ENCOUNTER — Other Ambulatory Visit: Payer: Self-pay

## 2020-09-01 DIAGNOSIS — M25671 Stiffness of right ankle, not elsewhere classified: Secondary | ICD-10-CM | POA: Diagnosis present

## 2020-09-01 DIAGNOSIS — R262 Difficulty in walking, not elsewhere classified: Secondary | ICD-10-CM | POA: Insufficient documentation

## 2020-09-01 DIAGNOSIS — M25571 Pain in right ankle and joints of right foot: Secondary | ICD-10-CM | POA: Diagnosis not present

## 2020-09-01 NOTE — Therapy (Signed)
[place], [place], [address] Phone: [phone]   Fax:  [phone]  Physical Therapy Treatment/Discharge Summary  Patient Details  Name: [name] MRN: [id number] Date of Birth: [birth year] Referring Provider (PT): Bevely Palmer Persons PA   Encounter Date: 09/01/2020   PT End of Session - 09/01/20 1531    Visit Number 8    Number of Visits 10    Date for PT Re-Evaluation 09/03/20    Authorization Type [employer]    PT Start Time 1532    PT Stop Time 1612    PT Time Calculation (min) 40 min    Activity Tolerance Patient tolerated treatment well;No increased pain    Behavior During Therapy WFL for tasks assessed/performed           Past Medical History:  Diagnosis Date  . Anxiety   . Asthma   . Chronic kidney disease    patient denies - 07/09/2019  . Complication of anesthesia    was slow to wake up with one of her surgeries  . Cough   . Headache    hx. migraines  . Hyperventilation   . Insomnia   . Pleurisy   . UTI (urinary tract infection)     Past Surgical History:  Procedure Laterality Date  . ABDOMINAL HYSTERECTOMY    . ANKLE FUSION Right 07/11/2019   Procedure: RIGHT TALONAVICULAR AND SUBTALAR FUSION;  Surgeon: Newt Minion, MD;  Location: [place];  Service: Orthopedics;  Laterality: Right;  . LUMBAR LAMINECTOMY N/A 07/04/2015   Procedure: Left L5-S1 MICRODISCECTOMY;  Surgeon: Jessy Oto, MD;  Location: [place];  Service: Orthopedics;  Laterality: N/A;  . LUMBAR SPINE SURGERY    . UTERINE FIBROID SURGERY    . VESICOVAGINAL FISTULA CLOSURE W/ TAH      There were no vitals filed for this visit.   Subjective Assessment - 09/01/20 1531    Subjective Patient reports doing well overall lately with continued occasional pain/discomfort in R heel around medial insertion of plantar fascia when walking that is not constant.    Pertinent History anxiety, lumbar laminectomy    How long can you  stand comfortably? painful as soon as she stands    How long can you walk comfortably? can walk it is just painful; has to wear flats    Diagnostic tests MRI: end plate osteophytes with eviance of previous laminectomy    Patient Stated Goals walk with less pain    Currently in Pain? Yes    Pain Score 2     Pain Location Foot    Pain Orientation Right    Pain Descriptors / Indicators Aching    Pain Type Chronic pain    Pain Onset More than a month ago              [facility] PT Assessment - 09/01/20 0001      Assessment   Medical Diagnosis Right Foot Pain    Referring Provider (PT) Bevely Palmer Persons PA      Observation/Other Assessments   Focus on Therapeutic Outcomes (FOTO)  No FOTO      AROM   Right Ankle Dorsiflexion 15    Right Ankle Plantar Flexion 35    Right Ankle Inversion 25    Right Ankle Eversion 20      Strength   Right Ankle Dorsiflexion 5/5    Right Ankle Plantar Flexion 5/5    Right Ankle Inversion 5/5  Right Ankle Eversion 5/5                         [facility] Adult PT Treatment/Exercise - 09/01/20 0001      Ambulation/Gait   Ambulation/Gait Yes    Ambulation/Gait Assistance 7: Independent    Ambulation Distance (Feet) 50 Feet   without shoes   Assistive device None    Gait Pattern Step-through pattern    Ambulation Surface Level;Indoor    Stairs Yes    Stairs Assistance 7: Independent    Stair Management Technique No rails    Number of Stairs 20   20 6" steps and 12 4" steps   Height of Stairs 6   6" x 5 sets and 4" x 2 sets   Gait Comments R heel "pulling" with heel strike and especially noted during gait training without shoes      Self-Care   Self-Care Other Self-Care Comments    Other Self-Care Comments  See patient education      Knee/Hip Exercises: Standing   Hip Flexion Limitations Alternating marches on airex x 30 (15x each LE)    Forward Lunges Both;15 reps    Functional Squat 2 sets;15 reps    Functional Squat  Limitations UE at freemotion PRN    SLS R SLS on airex 10 x 10-15 seconds      Ankle Exercises: Stretches   Soleus Stretch 1 rep;Other (comment)   90 seconds   Gastroc Stretch 1 rep;Other (comment)   90 seconds     Ankle Exercises: Standing   Toe Raise 20 reps   leaning against wall with slight knee FL                 PT Education - 09/01/20 Halfway    Education Details Updated and reviewed final HEP. Reiterated continued self STM/MFR and use of modalities as needed    Person(s) Educated Patient    Methods Explanation;Demonstration;Tactile cues;Verbal cues    Comprehension Verbalized understanding;Returned demonstration            PT Short Term Goals - 09/01/20 1542      PT SHORT TERM GOAL #1   Title Patient willl be independent with desensitization program    Time 3    Period Weeks    Status Achieved    Target Date 07/01/20      PT SHORT TERM GOAL #2   Title Patient will report a 50% decrease in burning and aching pain    Baseline 75% improvement in burning and aching    Time 3    Period Weeks    Status Achieved    Target Date 07/01/20      PT SHORT TERM GOAL #3   Title Patient will demonstrate 5/5 gross right LE strength    Time 3    Period Weeks    Status Achieved    Target Date 07/01/20             PT Long Term Goals - 09/01/20 1543      PT LONG TERM GOAL #1   Title Patient will wear whatever shoes she wants without increased pain    Baseline pt reports she has not tried wearing a variety of shoes    Time 6    Period Weeks    Status Not Met      PT LONG TERM GOAL #2   Title Patient will go up and down steps without pain  Baseline descending causes more pain than ascending but able to perform without significant pain    Time 6    Period Weeks    Status Achieved      PT LONG TERM GOAL #3   Title Patient will go shopping without increased foot pain    Baseline was out shopping for 4 hours but had increased pain afterwards    Time 6     Period Weeks    Status Partially Met                 Plan - 09/01/20 1531    Clinical Impression Statement Patient tolerated session well today with continued minimal pain in R heel but no significant increase or limitation this session. She has progressed well and has met the majority of stated rehab goals though she has not yet tried to wear a variety of shoes and has heel pain with prolonged weightbearing/walking. She expresses that her heel pain "feels like a rubberband" rather than sharp or stabbing majority of the time recently. She plans to continue HEP independently and follow up with her doctor in 5 weeks. She demonstrates R ankle mobility and strength WFL with no pain during AROM or resistance aside from some continued numbness along scars. She has been compliant with exercises and has been using modalities and self STM/MFR as needed and should continue to do well following discharge.    Personal Factors and Comorbidities Comorbidity 1;Comorbidity 2    Comorbidities anxiety, ankle fusion    Examination-Activity Limitations Stairs;Squat;Transfers;Locomotion Level    Examination-Participation Restrictions Church;Community Activity;Cleaning;School    Stability/Clinical Decision Making Evolving/Moderate complexity    Rehab Potential Good    PT Frequency 2x / week    PT Duration 6 weeks    PT Treatment/Interventions ADLs/Self Care Home Management;Electrical Stimulation;Cryotherapy;Iontophoresis 43m/ml Dexamethasone;Moist Heat;Ultrasound;Gait training;DME Instruction;Functional mobility training;Therapeutic exercise;Therapeutic activities;Neuromuscular re-education;Patient/family education;Manual techniques;Passive range of motion;Taping;Dry needling    PT Next Visit Plan D/C today    PT Home Exercise Plan [id number] - densensitization, gentle ankle DF    Consulted and Agree with Plan of Care Patient           Patient will benefit from skilled therapeutic intervention in order to  improve the following deficits and impairments:  Difficulty walking,Abnormal gait,Increased muscle spasms,Decreased scar mobility,Impaired sensation,Decreased mobility,Decreased strength  Visit Diagnosis: Pain in right ankle and joints of right foot  Difficulty in walking, not elsewhere classified  Stiffness of right ankle, not elsewhere classified     Problem List Patient Active Problem List   Diagnosis Date Noted  . H/O ankle fusion 07/18/2019  . Posterior tibial tendinitis, right leg   . Sinusitis 04/10/2018  . Asthma exacerbation 03/28/2018  . SOB (shortness of breath)   . Pyelonephritis 04/15/2016  . Normochromic normocytic anemia 04/15/2016  . Elevated LFTs 04/15/2016  . HNP (herniated nucleus pulposus), lumbar 07/04/2015    Class: Acute  . Anxiety state 08/08/2013  . INSOMNIA 05/20/2008  . Asthma with bronchitis 05/15/2007  . PLEURISY 05/15/2007  . HYPERVENTILATION 05/15/2007  . COUGH 05/15/2007    PHYSICAL THERAPY DISCHARGE SUMMARY  Visits from Start of Care: 8  Current functional level related to goals / functional outcomes: See above   Remaining deficits: See above    Education / Equipment: See above  Plan: Patient agrees to discharge.  Patient goals were met. Patient is being discharged due to meeting the stated rehab goals.  ?????     Patient has met majority of stated rehab  goals and is pleased with her current functional level. She reports compliance with HEP and states she will plan to continue performing exercises independently, and she will request a new referral for PT if needed in the future.   Haydee Monica, PT, DPT 09/01/20 6:38 PM  [facility] [address] [place], [place], [address] Phone: [phone]   Fax:  [phone]  Name: [name] MRN: [id number] Date of Birth: July 25, 1960

## 2020-09-11 ENCOUNTER — Encounter: Payer: Managed Care, Other (non HMO) | Admitting: Physical Therapy

## 2020-09-16 ENCOUNTER — Encounter: Payer: Managed Care, Other (non HMO) | Admitting: Physical Therapy

## 2020-09-19 ENCOUNTER — Other Ambulatory Visit: Payer: Self-pay | Admitting: Physician Assistant

## 2021-02-24 ENCOUNTER — Other Ambulatory Visit: Payer: Self-pay | Admitting: Obstetrics and Gynecology

## 2021-02-24 DIAGNOSIS — Z1231 Encounter for screening mammogram for malignant neoplasm of breast: Secondary | ICD-10-CM

## 2021-03-05 ENCOUNTER — Encounter: Payer: Self-pay | Admitting: Physician Assistant

## 2021-03-05 ENCOUNTER — Ambulatory Visit (INDEPENDENT_AMBULATORY_CARE_PROVIDER_SITE_OTHER): Payer: Managed Care, Other (non HMO)

## 2021-03-05 ENCOUNTER — Ambulatory Visit: Payer: Managed Care, Other (non HMO) | Admitting: Physician Assistant

## 2021-03-05 DIAGNOSIS — M79671 Pain in right foot: Secondary | ICD-10-CM

## 2021-03-05 NOTE — Progress Notes (Signed)
Office Visit Note   Patient: [name]           Date of Birth: [birth year]           MRN: [id number] Visit Date: 03/05/2021              Requested by: [name], MD [address] [place],  [place] [address] PCP: [name], MD  No chief complaint on file.     HPI: Patient presents today she is status post right subtalar and talonavicular fusion.  She struggled for a long time with complex regional pain syndrome and finally with therapy this significantly improved she comes in today because she has a 1 month history of increasing sensitivity across her ankle and the dorsum of her foot.  She denies any injury.  She denies any change in activity.  She is not currently taking gabapentin but took this in the past  Assessment & Plan: Visit Diagnoses:  1. Right foot pain     Plan: We will have her continue with gabapentin.  Follow-up in 2 weeks she we will do a brief immobilization in her boot however I do want to make sure she is coming out of her boot for desensitization.  Follow-Up Instructions: No follow-ups on file.   Ortho Exam  Patient is alert, oriented, no adenopathy, well-dressed, normal affect, normal respiratory effort. Examination she has palpable dorsalis pedis pulses.  She has sensitivity to light touch over the lateral side of her ankle crossing into the dorsum of her foot.  No cellulitis no signs of infection.  Tapping at the fibular head reproduces her symptoms consistent with irritation of the superficial peroneal nerve compartments are soft and compressible no calf tenderness  Imaging: XR Ankle Complete Right  Result Date: 03/05/2021 X-rays of the ankle demonstrate well-maintained alignment through the mortise.  No acute osseous injuries or abnormalities  XR Foot Complete Right  Result Date: 03/05/2021 2 views of the right foot demonstrate stable talonavicular and subtalar arthrodesis.  Hardware is intact and in place with mature  fusions  No images are attached to the encounter.  Labs: Lab Results  Component Value Date   HGBA1C  05/14/2008    5.6 (NOTE)   The ADA recommends the following therapeutic goal for glycemic   control related to Hgb A1C measurement:   Goal of Therapy:   < 7.0% Hgb A1C   Reference: American Diabetes Association: Clinical Practice   Recommendations 2008, Diabetes Care,  2008, 31:(Suppl 1).   ESRSEDRATE 11 03/15/2019   LABURIC 6.4 03/15/2019   REPTSTATUS 04/20/2016 FINAL 04/15/2016   REPTSTATUS 04/20/2016 FINAL 04/15/2016   CULT  04/15/2016    NO GROWTH 5 DAYS Performed at [facility]    CULT  04/15/2016    NO GROWTH 5 DAYS Performed at [facility]    [facility] ESCHERICHIA COLI (A) 04/09/2016     Lab Results  Component Value Date   ALBUMIN 3.2 (L) 04/18/2016   ALBUMIN 3.2 (L) 04/16/2016   ALBUMIN 3.3 (L) 04/15/2016    Lab Results  Component Value Date   MG 1.9 04/18/2016   MG 2.0 04/16/2016   MG 2.1 05/16/2008   No results found for: VD25OH  No results found for: PREALBUMIN CBC EXTENDED Latest Ref Rng & Units 07/11/2019 03/15/2019 04/10/2018  WBC 4.0 - 10.5 K/uL 5.2 4.3 6.4  RBC 3.87 - 5.11 MIL/uL 4.04 4.19 4.30  HGB 12.0 - 15.0 g/dL 11.7(L) 11.9 12.5  HCT 36.0 - 46.0 % 35.8(L) 36.0 37.2  PLT 150 - 400 K/uL 180 232 188.0  NEUTROABS 1.4 - 7.7 K/uL - - 4.2  LYMPHSABS 0.7 - 4.0 K/uL - - 1.9     There is no height or weight on file to calculate BMI.  Orders:  Orders Placed This Encounter  Procedures   XR Ankle Complete Right   XR Foot Complete Right   No orders of the defined types were placed in this encounter.    Procedures: No procedures performed  Clinical Data: No additional findings.  ROS:  All other systems negative, except as noted in the HPI. Review of Systems  Objective: Vital Signs: There were no vitals taken for this visit.  Specialty Comments:  No specialty comments available.  PMFS History: Patient Active Problem  List   Diagnosis Date Noted   H/O ankle fusion 07/18/2019   Posterior tibial tendinitis, right leg    Sinusitis 04/10/2018   Asthma exacerbation 03/28/2018   SOB (shortness of breath)    Pyelonephritis 04/15/2016   Normochromic normocytic anemia 04/15/2016   Elevated LFTs 04/15/2016   HNP (herniated nucleus pulposus), lumbar 07/04/2015    Class: Acute   Anxiety state 08/08/2013   INSOMNIA 05/20/2008   Asthma with bronchitis 05/15/2007   PLEURISY 05/15/2007   HYPERVENTILATION 05/15/2007   COUGH 05/15/2007   Past Medical History:  Diagnosis Date   Anxiety    Asthma    Chronic kidney disease    patient denies - 07/09/2019   Complication of anesthesia    was slow to wake up with one of her surgeries   Cough    Headache    hx. migraines   Hyperventilation    Insomnia    Pleurisy    UTI (urinary tract infection)     Family History  Problem Relation Age of Onset   Prostate cancer Father    Allergic rhinitis Mother    Breast cancer Mother 49   Angioedema Neg Hx    Eczema Neg Hx    Urticaria Neg Hx    Asthma Neg Hx     Past Surgical History:  Procedure Laterality Date   ABDOMINAL HYSTERECTOMY     ANKLE FUSION Right 07/11/2019   Procedure: RIGHT TALONAVICULAR AND SUBTALAR FUSION;  Surgeon: Nadara Mustard, MD;  [facility];  Service: Orthopedics;  Laterality: Right;   LUMBAR LAMINECTOMY N/A 07/04/2015   Procedure: Left L5-S1 MICRODISCECTOMY;  Surgeon: Kerrin Champagne, MD;  [facility];  Service: Orthopedics;  Laterality: N/A;   LUMBAR SPINE SURGERY     UTERINE FIBROID SURGERY     VESICOVAGINAL FISTULA CLOSURE W/ TAH     Social History   Occupational History   Occupation: marketing/sales  Tobacco Use   Smoking status: Never   Smokeless tobacco: Never  Vaping Use   Vaping Use: Never used  Substance and Sexual Activity   Alcohol use: No   Drug use: No   Sexual activity: Not on file

## 2021-03-06 ENCOUNTER — Telehealth: Payer: Self-pay | Admitting: Orthopedic Surgery

## 2021-03-06 ENCOUNTER — Other Ambulatory Visit: Payer: Self-pay | Admitting: Physician Assistant

## 2021-03-06 MED ORDER — GABAPENTIN 300 MG PO CAPS: ORAL_CAPSULE | ORAL | 3 refills | Status: DC

## 2021-03-06 NOTE — Telephone Encounter (Signed)
Pt states that she discussed a refill of her  gabapentin during her office visit yesterday. This has not been  called into her pharmacy.  Can you please send for pt today.   I called and sw pt to advise that per the office note that she was to have a brief time of immobilization but to remove the boot to work on desensitization of the skin ie: massaging the skin rubbing,tapping etc.  Advised that she could discuss this with Dr. Lajoyce Corners at her office visit

## 2021-03-06 NOTE — Telephone Encounter (Signed)
Pt called and asked if she can be seen today to get a cast. Pt states the boot is hurting her and she need to switch to a foot cast. Please call pt about this matter at [phone].

## 2021-03-10 ENCOUNTER — Other Ambulatory Visit: Payer: Self-pay | Admitting: Physician Assistant

## 2021-03-10 ENCOUNTER — Other Ambulatory Visit: Payer: Self-pay

## 2021-03-10 ENCOUNTER — Telehealth: Payer: Self-pay | Admitting: Orthopedic Surgery

## 2021-03-10 MED ORDER — GABAPENTIN 300 MG PO CAPS: ORAL_CAPSULE | ORAL | 3 refills | Status: AC

## 2021-03-10 MED ORDER — PREGABALIN 75 MG PO CAPS: 75.0000 mg | ORAL_CAPSULE | Freq: Two times a day (BID) | ORAL | 0 refills | Status: DC

## 2021-03-10 NOTE — Telephone Encounter (Signed)
Called pt and she asked that her Gabapentin rx is sent to [facility] [address]

## 2021-03-10 NOTE — Telephone Encounter (Signed)
Lyrica called in. 75 BID

## 2021-03-10 NOTE — Telephone Encounter (Signed)
Pt called and states pharmacy does not have gabapentin and do not know when they are going to get it in. Wondering if she could get something else called in?   CB [phone]

## 2021-03-10 NOTE — Telephone Encounter (Signed)
done

## 2021-03-10 NOTE — Telephone Encounter (Signed)
I had called the pt as stated  in the note below and she asked if the Neurontin could be sent to the [facility] pharmacy below.

## 2021-03-16 ENCOUNTER — Telehealth: Payer: Self-pay | Admitting: Orthopedic Surgery

## 2021-03-16 NOTE — Telephone Encounter (Signed)
I called pt and moved up appt from next Thursday to this Thursday to discuss pain medication vs next steps with Dr. Lajoyce Corners.

## 2021-03-16 NOTE — Telephone Encounter (Signed)
Pt called and states that nerve medicaiton is not helping. She needs pain medication. CB [phone]

## 2021-03-18 ENCOUNTER — Other Ambulatory Visit: Payer: Self-pay

## 2021-03-18 ENCOUNTER — Ambulatory Visit
Admission: RE | Admit: 2021-03-18 | Discharge: 2021-03-18 | Disposition: A | Discharge status: Home / Self Care | Payer: Managed Care, Other (non HMO) | Source: Ambulatory Visit | Attending: Obstetrics and Gynecology | Admitting: Obstetrics and Gynecology

## 2021-03-18 DIAGNOSIS — Z1231 Encounter for screening mammogram for malignant neoplasm of breast: Secondary | ICD-10-CM

## 2021-03-19 ENCOUNTER — Encounter: Payer: Self-pay | Admitting: Orthopedic Surgery

## 2021-03-19 ENCOUNTER — Ambulatory Visit: Payer: Managed Care, Other (non HMO) | Admitting: Orthopedic Surgery

## 2021-03-19 DIAGNOSIS — M79671 Pain in right foot: Secondary | ICD-10-CM

## 2021-03-19 MED ORDER — DULOXETINE HCL 30 MG PO CPEP: 30.0000 mg | ORAL_CAPSULE | Freq: Every morning | ORAL | 3 refills | Status: DC

## 2021-03-19 MED ORDER — AMITRIPTYLINE HCL 25 MG PO TABS: 25.0000 mg | ORAL_TABLET | Freq: Every day | ORAL | 3 refills | Status: DC

## 2021-03-26 ENCOUNTER — Ambulatory Visit: Payer: Managed Care, Other (non HMO) | Admitting: Orthopedic Surgery

## 2021-04-05 ENCOUNTER — Encounter: Payer: Self-pay | Admitting: Orthopedic Surgery

## 2021-04-05 NOTE — Progress Notes (Signed)
Office Visit Note   Patient: [name]           Date of Birth: 07-Jun-1961           MRN: [id number] Visit Date: 03/19/2021              Requested by: [name], MD [address] [place],  [place] [address] PCP: [name], MD  Chief Complaint  Patient presents with   Right Foot - Pain      HPI: Patient is a 60 year old woman who presents complaining of global right foot pain she complains of numbness and burning.  She has problems sleeping states the Neurontin is not helping.  She states she has tenderness to light touch.  She has tried a cam boot without relief.  Assessment & Plan: Visit Diagnoses:  1. Right foot pain     Plan: We will place her in a walking cast a prescription for Elavil and Cymbalta.  Patient does seem to have early complex regional pain syndrome.  Remove the cast at follow-up.  Follow-Up Instructions: Return in about 3 weeks (around 04/09/2021).   Ortho Exam  Patient is alert, oriented, no adenopathy, well-dressed, normal affect, normal respiratory effort. Examination patient has tenderness to touch along the Achilles pain to palpation around the midfoot.  Her previous incisions have healed well at the talonavicular and subtalar fusion.  The temperature is symmetric in both feet the color is symmetric she has a strong dorsalis pedis pulse.  Imaging: No results found. No images are attached to the encounter.  Labs: Lab Results  Component Value Date   HGBA1C  05/14/2008    5.6 (NOTE)   The ADA recommends the following therapeutic goal for glycemic   control related to Hgb A1C measurement:   Goal of Therapy:   < 7.0% Hgb A1C   Reference: American Diabetes Association: Clinical Practice   Recommendations 2008, Diabetes Care,  2008, 31:(Suppl 1).   ESRSEDRATE 11 03/15/2019   LABURIC 6.4 03/15/2019   REPTSTATUS 04/20/2016 FINAL 04/15/2016   REPTSTATUS 04/20/2016 FINAL 04/15/2016   CULT  04/15/2016    NO GROWTH 5  DAYS Performed at [facility]    CULT  04/15/2016    NO GROWTH 5 DAYS Performed at [facility]    [facility] ESCHERICHIA COLI (A) 04/09/2016     Lab Results  Component Value Date   ALBUMIN 3.2 (L) 04/18/2016   ALBUMIN 3.2 (L) 04/16/2016   ALBUMIN 3.3 (L) 04/15/2016    Lab Results  Component Value Date   MG 1.9 04/18/2016   MG 2.0 04/16/2016   MG 2.1 05/16/2008   No results found for: VD25OH  No results found for: PREALBUMIN CBC EXTENDED Latest Ref Rng & Units 07/11/2019 03/15/2019 04/10/2018  WBC 4.0 - 10.5 K/uL 5.2 4.3 6.4  RBC 3.87 - 5.11 MIL/uL 4.04 4.19 4.30  HGB 12.0 - 15.0 g/dL 11.7(L) 11.9 12.5  HCT 36.0 - 46.0 % 35.8(L) 36.0 37.2  PLT 150 - 400 K/uL 180 232 188.0  NEUTROABS 1.4 - 7.7 K/uL - - 4.2  LYMPHSABS 0.7 - 4.0 K/uL - - 1.9     There is no height or weight on file to calculate BMI.  Orders:  No orders of the defined types were placed in this encounter.  Meds ordered this encounter  Medications   amitriptyline (ELAVIL) 25 MG tablet    Sig: Take 1 tablet (25 mg total) by mouth at bedtime.  Dispense:  30 tablet    Refill:  3   DULoxetine (CYMBALTA) 30 MG capsule    Sig: Take 1 capsule (30 mg total) by mouth every morning. Qam    Dispense:  30 capsule    Refill:  3     Procedures: No procedures performed  Clinical Data: No additional findings.  ROS:  All other systems negative, except as noted in the HPI. Review of Systems  Objective: Vital Signs: There were no vitals taken for this visit.  Specialty Comments:  No specialty comments available.  PMFS History: Patient Active Problem List   Diagnosis Date Noted   H/O ankle fusion 07/18/2019   Posterior tibial tendinitis, right leg    Sinusitis 04/10/2018   Asthma exacerbation 03/28/2018   SOB (shortness of breath)    Pyelonephritis 04/15/2016   Normochromic normocytic anemia 04/15/2016   Elevated LFTs 04/15/2016   HNP (herniated nucleus pulposus), lumbar 07/04/2015     Class: Acute   Anxiety state 08/08/2013   INSOMNIA 05/20/2008   Asthma with bronchitis 05/15/2007   PLEURISY 05/15/2007   HYPERVENTILATION 05/15/2007   COUGH 05/15/2007   Past Medical History:  Diagnosis Date   Anxiety    Asthma    Chronic kidney disease    patient denies - 07/09/2019   Complication of anesthesia    was slow to wake up with one of her surgeries   Cough    Headache    hx. migraines   Hyperventilation    Insomnia    Pleurisy    UTI (urinary tract infection)     Family History  Problem Relation Age of Onset   Prostate cancer Father    Allergic rhinitis Mother    Breast cancer Mother 74   Angioedema Neg Hx    Eczema Neg Hx    Urticaria Neg Hx    Asthma Neg Hx     Past Surgical History:  Procedure Laterality Date   ABDOMINAL HYSTERECTOMY     ANKLE FUSION Right 07/11/2019   Procedure: RIGHT TALONAVICULAR AND SUBTALAR FUSION;  Surgeon: Nadara Mustard, MD;  Location: [facility];  Service: Orthopedics;  Laterality: Right;   LUMBAR LAMINECTOMY N/A 07/04/2015   Procedure: Left L5-S1 MICRODISCECTOMY;  Surgeon: Kerrin Champagne, MD;  Location: [facility];  Service: Orthopedics;  Laterality: N/A;   LUMBAR SPINE SURGERY     UTERINE FIBROID SURGERY     VESICOVAGINAL FISTULA CLOSURE W/ TAH     Social History   Occupational History   Occupation: marketing/sales  Tobacco Use   Smoking status: Never   Smokeless tobacco: Never  Vaping Use   Vaping Use: Never used  Substance and Sexual Activity   Alcohol use: No   Drug use: No   Sexual activity: Not on file

## 2021-04-09 ENCOUNTER — Ambulatory Visit: Payer: Managed Care, Other (non HMO) | Admitting: Orthopedic Surgery

## 2021-04-10 ENCOUNTER — Other Ambulatory Visit: Payer: Self-pay | Admitting: Orthopedic Surgery

## 2021-04-10 MED ORDER — AMITRIPTYLINE HCL 25 MG PO TABS: 25.0000 mg | ORAL_TABLET | Freq: Every day | ORAL | 3 refills | Status: DC

## 2021-04-10 MED ORDER — DULOXETINE HCL 30 MG PO CPEP: 30.0000 mg | ORAL_CAPSULE | Freq: Every morning | ORAL | 3 refills | Status: DC

## 2021-04-13 ENCOUNTER — Other Ambulatory Visit: Payer: Self-pay | Admitting: Orthopedic Surgery

## 2021-04-13 MED ORDER — DULOXETINE HCL 30 MG PO CPEP: 30.0000 mg | ORAL_CAPSULE | Freq: Every morning | ORAL | 3 refills | Status: DC

## 2021-04-13 MED ORDER — AMITRIPTYLINE HCL 25 MG PO TABS: 25.0000 mg | ORAL_TABLET | Freq: Every day | ORAL | 3 refills | Status: DC

## 2021-04-16 ENCOUNTER — Ambulatory Visit: Payer: Managed Care, Other (non HMO) | Admitting: Orthopedic Surgery

## 2021-04-16 DIAGNOSIS — M76821 Posterior tibial tendinitis, right leg: Secondary | ICD-10-CM

## 2021-04-19 ENCOUNTER — Encounter: Payer: Self-pay | Admitting: Orthopedic Surgery

## 2021-04-19 NOTE — Progress Notes (Signed)
Office Visit Note   Patient: [name]           Date of Birth: 02-28-1961           MRN: [id number] Visit Date: 04/16/2021              Requested by: [name], MD [address] [place],  [place] [address] PCP: [name], MD  Chief Complaint  Patient presents with   Right Foot - Pain, Follow-up      HPI: Patient is a 60 year old woman who was 9 months status post subtalar and talonavicular fusion for posterior tibial tendon insufficiency.  Patient developed complex regional pain symptoms type I.  Patient is felt better with the cast she is currently on Cymbalta and Elavil.  She did not have pain relief in a cam boot.  She is currently asymptomatic.  Having difficulty sleeping at night.  Assessment & Plan: Visit Diagnoses:  1. Posterior tibial tendinitis, right leg     Plan: Recommended that she increase the Elavil to 50 mg at night continue with the Cymbalta continue with the cast.  At follow-up remove the cast and obtain radiographs.  Follow-Up Instructions: Return in about 3 weeks (around 05/07/2021).   Ortho Exam  Patient is alert, oriented, no adenopathy, well-dressed, normal affect, normal respiratory effort. Examination patient is currently in a cast she is not symptomatic at this time.  Patient has shooting pain at night.  Imaging: No results found. No images are attached to the encounter.  Labs: Lab Results  Component Value Date   HGBA1C  05/14/2008    5.6 (NOTE)   The ADA recommends the following therapeutic goal for glycemic   control related to Hgb A1C measurement:   Goal of Therapy:   < 7.0% Hgb A1C   Reference: American Diabetes Association: Clinical Practice   Recommendations 2008, Diabetes Care,  2008, 31:(Suppl 1).   ESRSEDRATE 11 03/15/2019   LABURIC 6.4 03/15/2019   REPTSTATUS 04/20/2016 FINAL 04/15/2016   REPTSTATUS 04/20/2016 FINAL 04/15/2016   CULT  04/15/2016    NO GROWTH 5 DAYS Performed at [facility]    CULT  04/15/2016    NO GROWTH 5 DAYS Performed at [facility]    [facility] ESCHERICHIA COLI (A) 04/09/2016     Lab Results  Component Value Date   ALBUMIN 3.2 (L) 04/18/2016   ALBUMIN 3.2 (L) 04/16/2016   ALBUMIN 3.3 (L) 04/15/2016    Lab Results  Component Value Date   MG 1.9 04/18/2016   MG 2.0 04/16/2016   MG 2.1 05/16/2008   No results found for: VD25OH  No results found for: PREALBUMIN CBC EXTENDED Latest Ref Rng & Units 07/11/2019 03/15/2019 04/10/2018  WBC 4.0 - 10.5 K/uL 5.2 4.3 6.4  RBC 3.87 - 5.11 MIL/uL 4.04 4.19 4.30  HGB 12.0 - 15.0 g/dL 11.7(L) 11.9 12.5  HCT 36.0 - 46.0 % 35.8(L) 36.0 37.2  PLT 150 - 400 K/uL 180 232 188.0  NEUTROABS 1.4 - 7.7 K/uL - - 4.2  LYMPHSABS 0.7 - 4.0 K/uL - - 1.9     There is no height or weight on file to calculate BMI.  Orders:  No orders of the defined types were placed in this encounter.  No orders of the defined types were placed in this encounter.    Procedures: No procedures performed  Clinical Data: No additional findings.  ROS:  All other systems negative, except as noted in the HPI.  Review of Systems  Objective: Vital Signs: There were no vitals taken for this visit.  Specialty Comments:  No specialty comments available.  PMFS History: Patient Active Problem List   Diagnosis Date Noted   H/O ankle fusion 07/18/2019   Posterior tibial tendinitis, right leg    Sinusitis 04/10/2018   Asthma exacerbation 03/28/2018   SOB (shortness of breath)    Pyelonephritis 04/15/2016   Normochromic normocytic anemia 04/15/2016   Elevated LFTs 04/15/2016   HNP (herniated nucleus pulposus), lumbar 07/04/2015    Class: Acute   Anxiety state 08/08/2013   INSOMNIA 05/20/2008   Asthma with bronchitis 05/15/2007   PLEURISY 05/15/2007   HYPERVENTILATION 05/15/2007   COUGH 05/15/2007   Past Medical History:  Diagnosis Date   Anxiety    Asthma    Chronic kidney disease    patient denies -  07/09/2019   Complication of anesthesia    was slow to wake up with one of her surgeries   Cough    Headache    hx. migraines   Hyperventilation    Insomnia    Pleurisy    UTI (urinary tract infection)     Family History  Problem Relation Age of Onset   Prostate cancer Father    Allergic rhinitis Mother    Breast cancer Mother 66   Angioedema Neg Hx    Eczema Neg Hx    Urticaria Neg Hx    Asthma Neg Hx     Past Surgical History:  Procedure Laterality Date   ABDOMINAL HYSTERECTOMY     ANKLE FUSION Right 07/11/2019   Procedure: RIGHT TALONAVICULAR AND SUBTALAR FUSION;  Surgeon: Nadara Mustard, MD;  [facility];  Service: Orthopedics;  Laterality: Right;   LUMBAR LAMINECTOMY N/A 07/04/2015   Procedure: Left L5-S1 MICRODISCECTOMY;  Surgeon: Kerrin Champagne, MD;  Location: [facility];  Service: Orthopedics;  Laterality: N/A;   LUMBAR SPINE SURGERY     UTERINE FIBROID SURGERY     VESICOVAGINAL FISTULA CLOSURE W/ TAH     Social History   Occupational History   Occupation: marketing/sales  Tobacco Use   Smoking status: Never   Smokeless tobacco: Never  Vaping Use   Vaping Use: Never used  Substance and Sexual Activity   Alcohol use: No   Drug use: No   Sexual activity: Not on file

## 2021-05-06 ENCOUNTER — Ambulatory Visit (INDEPENDENT_AMBULATORY_CARE_PROVIDER_SITE_OTHER): Payer: Managed Care, Other (non HMO) | Admitting: Orthopedic Surgery

## 2021-05-06 ENCOUNTER — Ambulatory Visit (INDEPENDENT_AMBULATORY_CARE_PROVIDER_SITE_OTHER): Payer: Managed Care, Other (non HMO)

## 2021-05-06 ENCOUNTER — Encounter: Payer: Self-pay | Admitting: Orthopedic Surgery

## 2021-05-06 DIAGNOSIS — G8929 Other chronic pain: Secondary | ICD-10-CM | POA: Diagnosis not present

## 2021-05-06 DIAGNOSIS — M25571 Pain in right ankle and joints of right foot: Secondary | ICD-10-CM

## 2021-05-06 DIAGNOSIS — M76821 Posterior tibial tendinitis, right leg: Secondary | ICD-10-CM | POA: Diagnosis not present

## 2021-05-06 MED ORDER — METHYLPREDNISOLONE ACETATE 40 MG/ML IJ SUSP
40.0000 mg | INTRAMUSCULAR | Status: AC | PRN
Start: 2021-05-06 — End: 2021-05-06
  Administered 2021-05-06: 40 mg via INTRA_ARTICULAR

## 2021-05-06 MED ORDER — LIDOCAINE HCL 1 % IJ SOLN
2.0000 mL | INTRAMUSCULAR | Status: AC | PRN
  Administered 2021-05-06: 2 mL

## 2021-05-06 NOTE — Progress Notes (Signed)
Office Visit Note   Patient: [name]           Date of Birth: [birth year]           MRN: [id number] Visit Date: 05/06/2021              Requested by: [name], MD [address] [place],  [place] [address] PCP: [name], MD  Chief Complaint  Patient presents with   Right Leg - Follow-up      HPI: Patient is a 60 year old woman who presents in follow-up status post right ankle pain with a talonavicular and subtalar fusion for posterior tibial tendon insufficiency.  Patient states she felt well in the cast she has been using Elavil 50 mg at night and is on Cymbalta in the morning for assistance with pain control.  Assessment & Plan: Visit Diagnoses:  1. Posterior tibial tendinitis, right leg   2. Chronic pain of right ankle     Plan: Patient is fusion site has appeared to completely heal well.  Will advance her to a short fracture boot she will work on range of motion of the ankle daily use the fracture boot for walking only.  Follow-Up Instructions: Return in about 3 weeks (around 05/27/2021).   Ortho Exam  Patient is alert, oriented, no adenopathy, well-dressed, normal affect, normal respiratory effort. Examination there is no dystrophic skin color or temperature changes no hypersensitivity to light touch.  Patient has pain to palpation anteriorly and posteriorly over the ankle.  Imaging: XR Ankle Complete Right  Result Date: 05/06/2021 Three-view radiographs of the right ankle shows a congruent mortise with stable subtalar and talonavicular fusion no fractures.  XR Foot Complete Right  Result Date: 05/06/2021 Three-view radiographs of the right foot shows stable subtalar and talonavicular fusion.  No images are attached to the encounter.  Labs: Lab Results  Component Value Date   HGBA1C  05/14/2008    5.6 (NOTE)   The ADA recommends the following therapeutic goal for glycemic   control related to Hgb A1C measurement:   Goal of  Therapy:   < 7.0% Hgb A1C   Reference: American Diabetes Association: Clinical Practice   Recommendations 2008, Diabetes Care,  2008, 31:(Suppl 1).   ESRSEDRATE 11 03/15/2019   LABURIC 6.4 03/15/2019   REPTSTATUS 04/20/2016 FINAL 04/15/2016   REPTSTATUS 04/20/2016 FINAL 04/15/2016   CULT  04/15/2016    NO GROWTH 5 DAYS Performed at [facility]    CULT  04/15/2016    NO GROWTH 5 DAYS Performed at [facility]    [facility] ESCHERICHIA COLI (A) 04/09/2016     Lab Results  Component Value Date   ALBUMIN 3.2 (L) 04/18/2016   ALBUMIN 3.2 (L) 04/16/2016   ALBUMIN 3.3 (L) 04/15/2016    Lab Results  Component Value Date   MG 1.9 04/18/2016   MG 2.0 04/16/2016   MG 2.1 05/16/2008   No results found for: VD25OH  No results found for: PREALBUMIN CBC EXTENDED Latest Ref Rng & Units 07/11/2019 03/15/2019 04/10/2018  WBC 4.0 - 10.5 K/uL 5.2 4.3 6.4  RBC 3.87 - 5.11 MIL/uL 4.04 4.19 4.30  HGB 12.0 - 15.0 g/dL 11.7(L) 11.9 12.5  HCT 36.0 - 46.0 % 35.8(L) 36.0 37.2  PLT 150 - 400 K/uL 180 232 188.0  NEUTROABS 1.4 - 7.7 K/uL - - 4.2  LYMPHSABS 0.7 - 4.0 K/uL - - 1.9     There is no height or weight on  file to calculate BMI.  Orders:  Orders Placed This Encounter  Procedures   XR Foot Complete Right   XR Ankle Complete Right   No orders of the defined types were placed in this encounter.    Procedures: Medium Joint Inj: R ankle on 05/06/2021 4:39 PM Indications: pain and diagnostic evaluation Details: 22 G 1.5 in needle, anteromedial approach Medications: 2 mL lidocaine 1 %; 40 mg methylPREDNISolone acetate 40 MG/ML Outcome: tolerated well, no immediate complications Procedure, treatment alternatives, risks and benefits explained, specific risks discussed. Consent was given by the patient. Immediately prior to procedure a time out was called to verify the correct patient, procedure, equipment, support staff and site/side marked as required. Patient was prepped and  draped in the usual sterile fashion.     Clinical Data: No additional findings.  ROS:  All other systems negative, except as noted in the HPI. Review of Systems  Objective: Vital Signs: There were no vitals taken for this visit.  Specialty Comments:  No specialty comments available.  PMFS History: Patient Active Problem List   Diagnosis Date Noted   H/O ankle fusion 07/18/2019   Posterior tibial tendinitis, right leg    Sinusitis 04/10/2018   Asthma exacerbation 03/28/2018   SOB (shortness of breath)    Pyelonephritis 04/15/2016   Normochromic normocytic anemia 04/15/2016   Elevated LFTs 04/15/2016   HNP (herniated nucleus pulposus), lumbar 07/04/2015    Class: Acute   Anxiety state 08/08/2013   INSOMNIA 05/20/2008   Asthma with bronchitis 05/15/2007   PLEURISY 05/15/2007   HYPERVENTILATION 05/15/2007   COUGH 05/15/2007   Past Medical History:  Diagnosis Date   Anxiety    Asthma    Chronic kidney disease    patient denies - 07/09/2019   Complication of anesthesia    was slow to wake up with one of her surgeries   Cough    Headache    hx. migraines   Hyperventilation    Insomnia    Pleurisy    UTI (urinary tract infection)     Family History  Problem Relation Age of Onset   Prostate cancer Father    Allergic rhinitis Mother    Breast cancer Mother 68   Angioedema Neg Hx    Eczema Neg Hx    Urticaria Neg Hx    Asthma Neg Hx     Past Surgical History:  Procedure Laterality Date   ABDOMINAL HYSTERECTOMY     ANKLE FUSION Right 07/11/2019   Procedure: RIGHT TALONAVICULAR AND SUBTALAR FUSION;  Surgeon: Nadara Mustard, MD;  Location[facility];  Service: Orthopedics;  Laterality: Right;   LUMBAR LAMINECTOMY N/A 07/04/2015   Procedure: Left L5-S1 MICRODISCECTOMY;  Surgeon: Kerrin Champagne, MD;  Location: [facility];  Service: Orthopedics;  Laterality: N/A;   LUMBAR SPINE SURGERY     UTERINE FIBROID SURGERY     VESICOVAGINAL FISTULA CLOSURE W/ TAH     Social History    Occupational History   Occupation: marketing/sales  Tobacco Use   Smoking status: Never   Smokeless tobacco: Never  Vaping Use   Vaping Use: Never used  Substance and Sexual Activity   Alcohol use: No   Drug use: No   Sexual activity: Not on file

## 2021-05-07 ENCOUNTER — Telehealth: Payer: Self-pay | Admitting: Orthopedic Surgery

## 2021-05-07 ENCOUNTER — Ambulatory Visit: Payer: Managed Care, Other (non HMO) | Admitting: Orthopedic Surgery

## 2021-05-07 NOTE — Telephone Encounter (Signed)
Pt called again

## 2021-05-07 NOTE — Telephone Encounter (Signed)
Patient called. She would like to come by and pick the ankle brace today. Her call back number is [phone]

## 2021-05-08 ENCOUNTER — Ambulatory Visit: Payer: Managed Care, Other (non HMO)

## 2021-05-08 NOTE — Telephone Encounter (Signed)
I called pt to advise that she can come and pick up the ASO today I advised I will be here till 1:30 today and pt states that she will come at 1 pm..

## 2021-05-28 ENCOUNTER — Encounter: Payer: Self-pay | Admitting: Orthopedic Surgery

## 2021-05-28 ENCOUNTER — Ambulatory Visit: Payer: Managed Care, Other (non HMO) | Admitting: Orthopedic Surgery

## 2021-05-28 ENCOUNTER — Other Ambulatory Visit: Payer: Self-pay

## 2021-05-28 DIAGNOSIS — M7661 Achilles tendinitis, right leg: Secondary | ICD-10-CM

## 2021-05-28 NOTE — Progress Notes (Signed)
Office Visit Note   Patient: [name]           Date of Birth: 06-08-1961           MRN: [id number] Visit Date: 05/28/2021              Requested by: [name], MD [address] [place],  [place] [address] PCP: [name], MD  Chief Complaint  Patient presents with   Right Ankle - Follow-up      HPI: Patient is a 60 year old woman status post talonavicular and subtalar fusion for posterior tibial tendon insufficiency.  Radiographs have shown excellent healing of the subtalar and talonavicular fusion.  Patient is back in her short fracture boot with pain over the Achilles.  Assessment & Plan: Visit Diagnoses:  1. Achilles tendinitis, right leg     Plan: Patient was given a 9/16 inch heel lift to use in her boot and also 1 to use in her shoe.  Discussed that at follow-up if she still is symptomatic over the tendon we could proceed with a steroid injection.  Follow-Up Instructions: Return in about 4 weeks (around 06/25/2021).   Ortho Exam  Patient is alert, oriented, no adenopathy, well-dressed, normal affect, normal respiratory effort. Examination there is no redness or swelling around the foot and ankle there is no dystrophic changes no hypersensitivity to light touch.  Patient is point tender to palpation along the Achilles tendon there is no palpable defects.  There is no contractures she has dorsiflexion about 20 degrees past neutral.  Imaging: No results found. No images are attached to the encounter.  Labs: Lab Results  Component Value Date   HGBA1C  05/14/2008    5.6 (NOTE)   The ADA recommends the following therapeutic goal for glycemic   control related to Hgb A1C measurement:   Goal of Therapy:   < 7.0% Hgb A1C   Reference: American Diabetes Association: Clinical Practice   Recommendations 2008, Diabetes Care,  2008, 31:(Suppl 1).   ESRSEDRATE 11 03/15/2019   LABURIC 6.4 03/15/2019   REPTSTATUS 04/20/2016 FINAL 04/15/2016    REPTSTATUS 04/20/2016 FINAL 04/15/2016   CULT  04/15/2016    NO GROWTH 5 DAYS Performed at [facility]    CULT  04/15/2016    NO GROWTH 5 DAYS Performed at [facility]    [facility] ESCHERICHIA COLI (A) 04/09/2016     Lab Results  Component Value Date   ALBUMIN 3.2 (L) 04/18/2016   ALBUMIN 3.2 (L) 04/16/2016   ALBUMIN 3.3 (L) 04/15/2016    Lab Results  Component Value Date   MG 1.9 04/18/2016   MG 2.0 04/16/2016   MG 2.1 05/16/2008   No results found for: VD25OH  No results found for: PREALBUMIN CBC EXTENDED Latest Ref Rng & Units 07/11/2019 03/15/2019 04/10/2018  WBC 4.0 - 10.5 K/uL 5.2 4.3 6.4  RBC 3.87 - 5.11 MIL/uL 4.04 4.19 4.30  HGB 12.0 - 15.0 g/dL 11.7(L) 11.9 12.5  HCT 36.0 - 46.0 % 35.8(L) 36.0 37.2  PLT 150 - 400 K/uL 180 232 188.0  NEUTROABS 1.4 - 7.7 K/uL - - 4.2  LYMPHSABS 0.7 - 4.0 K/uL - - 1.9     There is no height or weight on file to calculate BMI.  Orders:  No orders of the defined types were placed in this encounter.  No orders of the defined types were placed in this encounter.    Procedures: No procedures performed  Clinical  Data: No additional findings.  ROS:  All other systems negative, except as noted in the HPI. Review of Systems  Objective: Vital Signs: There were no vitals taken for this visit.  Specialty Comments:  No specialty comments available.  PMFS History: Patient Active Problem List   Diagnosis Date Noted   H/O ankle fusion 07/18/2019   Posterior tibial tendinitis, right leg    Sinusitis 04/10/2018   Asthma exacerbation 03/28/2018   SOB (shortness of breath)    Pyelonephritis 04/15/2016   Normochromic normocytic anemia 04/15/2016   Elevated LFTs 04/15/2016   HNP (herniated nucleus pulposus), lumbar 07/04/2015    Class: Acute   Anxiety state 08/08/2013   INSOMNIA 05/20/2008   Asthma with bronchitis 05/15/2007   PLEURISY 05/15/2007   HYPERVENTILATION 05/15/2007   COUGH 05/15/2007   Past  Medical History:  Diagnosis Date   Anxiety    Asthma    Chronic kidney disease    patient denies - 07/09/2019   Complication of anesthesia    was slow to wake up with one of her surgeries   Cough    Headache    hx. migraines   Hyperventilation    Insomnia    Pleurisy    UTI (urinary tract infection)     Family History  Problem Relation Age of Onset   Prostate cancer Father    Allergic rhinitis Mother    Breast cancer Mother 33   Angioedema Neg Hx    Eczema Neg Hx    Urticaria Neg Hx    Asthma Neg Hx     Past Surgical History:  Procedure Laterality Date   ABDOMINAL HYSTERECTOMY     ANKLE FUSION Right 07/11/2019   Procedure: RIGHT TALONAVICULAR AND SUBTALAR FUSION;  Surgeon: Nadara Mustard, MD;  [facility];  Service: Orthopedics;  Laterality: Right;   LUMBAR LAMINECTOMY N/A 07/04/2015   Procedure: Left L5-S1 MICRODISCECTOMY;  Surgeon: Kerrin Champagne, MD;  [facility]: [facility];  Service: Orthopedics;  Laterality: N/A;   LUMBAR SPINE SURGERY     UTERINE FIBROID SURGERY     VESICOVAGINAL FISTULA CLOSURE W/ TAH     Social History   Occupational History   Occupation: marketing/sales  Tobacco Use   Smoking status: Never   Smokeless tobacco: Never  Vaping Use   Vaping Use: Never used  Substance and Sexual Activity   Alcohol use: No   Drug use: No   Sexual activity: Not on file

## 2021-07-02 ENCOUNTER — Ambulatory Visit: Payer: Managed Care, Other (non HMO) | Admitting: Orthopedic Surgery

## 2021-07-02 ENCOUNTER — Other Ambulatory Visit: Payer: Self-pay

## 2021-07-02 DIAGNOSIS — M25571 Pain in right ankle and joints of right foot: Secondary | ICD-10-CM | POA: Diagnosis not present

## 2021-07-02 DIAGNOSIS — G8929 Other chronic pain: Secondary | ICD-10-CM

## 2021-07-03 ENCOUNTER — Encounter: Payer: Self-pay | Admitting: Orthopedic Surgery

## 2021-07-03 DIAGNOSIS — G8929 Other chronic pain: Secondary | ICD-10-CM

## 2021-07-03 DIAGNOSIS — M25571 Pain in right ankle and joints of right foot: Secondary | ICD-10-CM

## 2021-07-03 MED ORDER — METHYLPREDNISOLONE ACETATE 40 MG/ML IJ SUSP
40.0000 mg | INTRAMUSCULAR | Status: AC | PRN
  Administered 2021-07-03: 40 mg via INTRA_ARTICULAR

## 2021-07-03 MED ORDER — LIDOCAINE HCL 1 % IJ SOLN
2.0000 mL | INTRAMUSCULAR | Status: AC | PRN
  Administered 2021-07-03: 2 mL

## 2021-07-03 NOTE — Progress Notes (Signed)
Office Visit Note   Patient: [name]           Date of Birth: 12-Jul-1960           MRN: [id number] Visit Date: 07/02/2021              Requested by: Newton Pigg, MD [place],  [place] [address] PCP: Newton Pigg, MD  Chief Complaint  Patient presents with   Right Ankle - Follow-up    S/p talonavicular and subtalar fusion 1 year post op       HPI: Patient is a 61 year old woman who presents in follow-up status post talonavicular and subtalar fusion for posterior tibial tendon insufficiency.  Patient was previously given a 916 since heel lift and she states this still causes pain anteriorly over the tibia.  Patient on her last exam was painful over the Achilles and underwent injection.  She states she is now painful anteriorly over the ankle.  Patient feels like her foot supinates when she walks.  Assessment & Plan: Visit Diagnoses:  1. Chronic pain of right ankle     Plan: Patient underwent an intra-articular injection for the right ankle.  We will need to reevaluate symptoms at follow-up.  Follow-Up Instructions: Return in about 4 weeks (around 07/30/2021).   Ortho Exam  Patient is alert, oriented, no adenopathy, well-dressed, normal affect, normal respiratory effort. Examination with patient standing her foot is plantigrade there is no varus or valgus subtalar changes.  She does walk with her foot more externally rotated than the left foot.  Pain is reproduced palpation anteriorly over the ankle the Achilles is not tender to palpation at this time.  Her ankle is at 90 degrees no signs of equinus deformity.  Imaging: No results found. No images are attached to the encounter.  Labs: Lab Results  Component Value Date   HGBA1C  05/14/2008    5.6 (NOTE)   The ADA recommends the following therapeutic goal for glycemic   control related to Hgb A1C measurement:   Goal of Therapy:   < 7.0% Hgb A1C   Reference: American Diabetes Association:  Clinical Practice   Recommendations 2008, Diabetes Care,  2008, 31:(Suppl 1).   ESRSEDRATE 11 03/15/2019   LABURIC 6.4 03/15/2019   REPTSTATUS 04/20/2016 FINAL 04/15/2016   REPTSTATUS 04/20/2016 FINAL 04/15/2016   CULT  04/15/2016    NO GROWTH 5 DAYS Performed at [place]  04/15/2016    NO GROWTH 5 DAYS Performed at [place] (A) 04/09/2016     Lab Results  Component Value Date   ALBUMIN 3.2 (L) 04/18/2016   ALBUMIN 3.2 (L) 04/16/2016   ALBUMIN 3.3 (L) 04/15/2016    Lab Results  Component Value Date   MG 1.9 04/18/2016   MG 2.0 04/16/2016   MG 2.1 05/16/2008   No results found for: VD25OH  No results found for: PREALBUMIN CBC EXTENDED Latest Ref Rng & Units 07/11/2019 03/15/2019 04/10/2018  WBC 4.0 - 10.5 K/uL 5.2 4.3 6.4  RBC 3.87 - 5.11 MIL/uL 4.04 4.19 4.30  HGB 12.0 - 15.0 g/dL 11.7(L) 11.9 12.5  HCT 36.0 - 46.0 % 35.8(L) 36.0 37.2  PLT 150 - 400 K/uL 180 232 188.0  NEUTROABS 1.4 - 7.7 K/uL - - 4.2  LYMPHSABS 0.7 - 4.0 K/uL - - 1.9     There is no height or weight on file to calculate BMI.  Orders:  No orders of the defined types were placed in this encounter.  No orders of the defined types were placed in this encounter.    Procedures: Medium Joint Inj: R ankle on 07/03/2021 4:21 PM Indications: pain and diagnostic evaluation Details: 22 G 1.5 in needle, anteromedial approach Medications: 2 mL lidocaine 1 %; 40 mg methylPREDNISolone acetate 40 MG/ML Outcome: tolerated well, no immediate complications Procedure, treatment alternatives, risks and benefits explained, specific risks discussed. Consent was given by the patient. Immediately prior to procedure a time out was called to verify the correct patient, procedure, equipment, support staff and site/side marked as required. Patient was prepped and draped in the usual sterile fashion.     Clinical Data: No additional findings.  ROS:  All other  systems negative, except as noted in the HPI. Review of Systems  Objective: Vital Signs: There were no vitals taken for this visit.  Specialty Comments:  No specialty comments available.  PMFS History: Patient Active Problem List   Diagnosis Date Noted   H/O ankle fusion 07/18/2019   Posterior tibial tendinitis, right leg    Sinusitis 04/10/2018   Asthma exacerbation 03/28/2018   SOB (shortness of breath)    Pyelonephritis 04/15/2016   Normochromic normocytic anemia 04/15/2016   Elevated LFTs 04/15/2016   HNP (herniated nucleus pulposus), lumbar 07/04/2015    Class: Acute   Anxiety state 08/08/2013   INSOMNIA 05/20/2008   Asthma with bronchitis 05/15/2007   PLEURISY 05/15/2007   HYPERVENTILATION 05/15/2007   COUGH 05/15/2007   Past Medical History:  Diagnosis Date   Anxiety    Asthma    Chronic kidney disease    patient denies - [id number]   Complication of anesthesia    was slow to wake up with one of her surgeries   Cough    Headache    hx. migraines   Hyperventilation    Insomnia    Pleurisy    UTI (urinary tract infection)     Family History  Problem Relation Age of Onset   Prostate cancer Father    Allergic rhinitis Mother    Breast cancer Mother 37   Angioedema Neg Hx    Eczema Neg Hx    Urticaria Neg Hx    Asthma Neg Hx     Past Surgical History:  Procedure Laterality Date   ABDOMINAL HYSTERECTOMY     ANKLE FUSION Right 07/11/2019   Procedure: RIGHT TALONAVICULAR AND SUBTALAR FUSION;  Surgeon: Newt Minion, MD;  Location: [facility];  Service: Orthopedics;  Laterality: Right;   LUMBAR LAMINECTOMY N/A 07/04/2015   Procedure: Left L5-S1 MICRODISCECTOMY;  Surgeon: Jessy Oto, MD;  Location: [facility];  Service: Orthopedics;  Laterality: N/A;   LUMBAR SPINE SURGERY     UTERINE FIBROID SURGERY     VESICOVAGINAL FISTULA CLOSURE W/ TAH     Social History   Occupational History   Occupation: marketing/sales  Tobacco Use   Smoking status: Never    Smokeless tobacco: Never  Vaping Use   Vaping Use: Never used  Substance and Sexual Activity   Alcohol use: No   Drug use: No   Sexual activity: Not on file

## 2021-07-30 ENCOUNTER — Other Ambulatory Visit: Payer: Self-pay

## 2021-07-30 ENCOUNTER — Encounter: Payer: Self-pay | Admitting: Orthopedic Surgery

## 2021-07-30 ENCOUNTER — Ambulatory Visit: Payer: Managed Care, Other (non HMO) | Admitting: Orthopedic Surgery

## 2021-07-30 DIAGNOSIS — G8929 Other chronic pain: Secondary | ICD-10-CM | POA: Diagnosis not present

## 2021-07-30 DIAGNOSIS — M25571 Pain in right ankle and joints of right foot: Secondary | ICD-10-CM | POA: Diagnosis not present

## 2021-07-30 DIAGNOSIS — M76821 Posterior tibial tendinitis, right leg: Secondary | ICD-10-CM | POA: Diagnosis not present

## 2021-07-30 NOTE — Progress Notes (Signed)
Office Visit Note   Patient: [name]           Date of Birth: [birth year]           MRN: [id number] Visit Date: 07/30/2021              Requested by: [name], MD [address] [place],  [place] [address] PCP: [name], MD  Chief Complaint  Patient presents with   Right Ankle - Follow-up      HPI: Patient is a 61 year old woman who is seen in follow-up for right ankle pain.  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  Patient states that she is having pain posteriorly to the ankle as well as anteriorly.  Patient states that the injection to her right ankle helped for about 2 weeks.  She states she is still dealing with pain with activities of daily living.  Assessment & Plan: Visit Diagnoses:  1. Chronic pain of right ankle   2. Posterior tibial tendinitis, right leg     Plan: We will obtain an MRI scan of the right ankle to evaluate for osteochondral defects or impingement syndrome.  Patient may benefit from arthroscopic debridement.  Follow-Up Instructions: Return in about 2 weeks (around 08/13/2021).   Ortho Exam  Patient is alert, oriented, no adenopathy, well-dressed, normal affect, normal respiratory effort. Examination there is no pain to palpation over the posterior tibial tendon the talonavicular and subtalar fusion is stable and nontender.  Patient has tenderness to palpation the posterior aspect of the ankle as well as anteriorly over the ankle joint.  There is no crepitation with passive range of motion there is no redness cellulitis or swelling.  There is no hypersensitivity to light touch.  Imaging: No results found. No images are attached to the encounter.  Labs: Lab Results  Component Value Date   HGBA1C  05/14/2008    5.6 (NOTE)   The ADA recommends the following therapeutic goal for glycemic   control related to Hgb A1C measurement:   Goal of Therapy:   < 7.0% Hgb A1C    Reference: American Diabetes Association: Clinical Practice   Recommendations 2008, Diabetes Care,  2008, 31:(Suppl 1).   ESRSEDRATE 11 03/15/2019   LABURIC 6.4 03/15/2019   REPTSTATUS 04/20/2016 FINAL 04/15/2016   REPTSTATUS 04/20/2016 FINAL 04/15/2016   CULT  04/15/2016    NO GROWTH 5 DAYS Performed at [facility]    CULT  04/15/2016    NO GROWTH 5 DAYS Performed at [facility]    [facility] ESCHERICHIA COLI (A) 04/09/2016     Lab Results  Component Value Date   ALBUMIN 3.2 (L) 04/18/2016   ALBUMIN 3.2 (L) 04/16/2016   ALBUMIN 3.3 (L) 04/15/2016    Lab Results  Component Value Date   MG 1.9 04/18/2016   MG 2.0 04/16/2016   MG 2.1 05/16/2008   No results found for: VD25OH  No results found for: PREALBUMIN CBC EXTENDED Latest Ref Rng & Units 07/11/2019 03/15/2019 04/10/2018  WBC 4.0 - 10.5 K/uL 5.2 4.3 6.4  RBC 3.87 - 5.11 MIL/uL 4.04 4.19 4.30  HGB 12.0 - 15.0 g/dL 11.7(L) 11.9 12.5  HCT 36.0 - 46.0 % 35.8(L) 36.0 37.2  PLT 150 - 400 K/uL 180 232 188.0  NEUTROABS 1.4 - 7.7 K/uL - - 4.2  LYMPHSABS 0.7 - 4.0 K/uL - - 1.9     There is no height or weight on  file to calculate BMI.  Orders:  No orders of the defined types were placed in this encounter.  No orders of the defined types were placed in this encounter.    Procedures: No procedures performed  Clinical Data: No additional findings.  ROS:  All other systems negative, except as noted in the HPI. Review of Systems  Objective: Vital Signs: There were no vitals taken for this visit.  Specialty Comments:  No specialty comments available.  PMFS History: Patient Active Problem List   Diagnosis Date Noted   H/O ankle fusion 07/18/2019   Posterior tibial tendinitis, right leg    Sinusitis 04/10/2018   Asthma exacerbation 03/28/2018   SOB (shortness of breath)    Pyelonephritis 04/15/2016   Normochromic normocytic anemia 04/15/2016   Elevated LFTs 04/15/2016   HNP (herniated  nucleus pulposus), lumbar 07/04/2015    Class: Acute   Anxiety state 08/08/2013   INSOMNIA 05/20/2008   Asthma with bronchitis 05/15/2007   PLEURISY 05/15/2007   HYPERVENTILATION 05/15/2007   COUGH 05/15/2007   Past Medical History:  Diagnosis Date   Anxiety    Asthma    Chronic kidney disease    patient denies - 07/09/2019   Complication of anesthesia    was slow to wake up with one of her surgeries   Cough    Headache    hx. migraines   Hyperventilation    Insomnia    Pleurisy    UTI (urinary tract infection)     Family History  Problem Relation Age of Onset   Prostate cancer Father    Allergic rhinitis Mother    Breast cancer Mother 60   Angioedema Neg Hx    Eczema Neg Hx    Urticaria Neg Hx    Asthma Neg Hx     Past Surgical History:  Procedure Laterality Date   ABDOMINAL HYSTERECTOMY     ANKLE FUSION Right 07/11/2019   Procedure: RIGHT TALONAVICULAR AND SUBTALAR FUSION;  Surgeon: Nadara Mustard, MD;  Location: [facility] OR;  Service: Orthopedics;  Laterality: Right;   LUMBAR LAMINECTOMY N/A 07/04/2015   Procedure: Left L5-S1 MICRODISCECTOMY;  Surgeon: Kerrin Champagne, MD;  Location: [facility] OR;  Service: Orthopedics;  Laterality: N/A;   LUMBAR SPINE SURGERY     UTERINE FIBROID SURGERY     VESICOVAGINAL FISTULA CLOSURE W/ TAH     Social History   Occupational History   Occupation: marketing/sales  Tobacco Use   Smoking status: Never   Smokeless tobacco: Never  Vaping Use   Vaping Use: Never used  Substance and Sexual Activity   Alcohol use: No   Drug use: No   Sexual activity: Not on file

## 2021-08-19 ENCOUNTER — Inpatient Hospital Stay: Admission: RE | Admit: 2021-08-19 | Payer: Managed Care, Other (non HMO) | Source: Ambulatory Visit

## 2021-08-24 ENCOUNTER — Ambulatory Visit: Payer: Managed Care, Other (non HMO) | Admitting: Orthopedic Surgery

## 2021-12-22 ENCOUNTER — Other Ambulatory Visit: Payer: Self-pay

## 2021-12-22 ENCOUNTER — Emergency Department (HOSPITAL_COMMUNITY)
Admission: EM | Admit: 2021-12-22 | Discharge: 2021-12-22 | Disposition: A | Discharge status: Home / Self Care | Payer: Managed Care, Other (non HMO) | Attending: Emergency Medicine | Admitting: Emergency Medicine

## 2021-12-22 ENCOUNTER — Encounter (HOSPITAL_COMMUNITY): Payer: Self-pay

## 2021-12-22 DIAGNOSIS — R630 Anorexia: Secondary | ICD-10-CM | POA: Insufficient documentation

## 2021-12-22 DIAGNOSIS — R799 Abnormal finding of blood chemistry, unspecified: Secondary | ICD-10-CM | POA: Diagnosis present

## 2021-12-22 DIAGNOSIS — R5383 Other fatigue: Secondary | ICD-10-CM | POA: Diagnosis not present

## 2021-12-22 DIAGNOSIS — N289 Disorder of kidney and ureter, unspecified: Secondary | ICD-10-CM | POA: Diagnosis not present

## 2021-12-22 LAB — URINALYSIS, ROUTINE W REFLEX MICROSCOPIC
Bilirubin Urine: NEGATIVE
Glucose, UA: NEGATIVE mg/dL
Ketones, ur: NEGATIVE mg/dL
Leukocytes,Ua: NEGATIVE
Nitrite: NEGATIVE
Protein, ur: NEGATIVE mg/dL
Specific Gravity, Urine: 1.01 (ref 1.005–1.030)
pH: 5 (ref 5.0–8.0)

## 2021-12-22 LAB — BASIC METABOLIC PANEL
Anion gap: 11 (ref 5–15)
BUN: 26 mg/dL — ABNORMAL HIGH (ref 6–20)
CO2: 23 mmol/L (ref 22–32)
Calcium: 9.7 mg/dL (ref 8.9–10.3)
Chloride: 107 mmol/L (ref 98–111)
Creatinine, Ser: 1.69 mg/dL — ABNORMAL HIGH (ref 0.44–1.00)
GFR, Estimated: 34 mL/min — ABNORMAL LOW (ref >60)
Glucose, Bld: 98 mg/dL (ref 70–99)
Potassium: 4.3 mmol/L (ref 3.5–5.1)
Sodium: 141 mmol/L (ref 135–145)

## 2021-12-22 LAB — CBC
HCT: 40.9 % (ref 36.0–46.0)
Hemoglobin: 13.3 g/dL (ref 12.0–15.0)
MCH: 27.7 pg (ref 26.0–34.0)
MCHC: 32.5 g/dL (ref 30.0–36.0)
MCV: 85.2 fL (ref 80.0–100.0)
Platelets: 270 10*3/uL (ref 150–400)
RBC: 4.8 MIL/uL (ref 3.87–5.11)
RDW: 13.1 % (ref 11.5–15.5)
WBC: 8.6 10*3/uL (ref 4.0–10.5)
nRBC: 0 % (ref 0.0–0.2)

## 2021-12-22 MED ORDER — LACTATED RINGERS IV BOLUS
1000.0000 mL | Freq: Once | INTRAVENOUS | Status: AC
  Administered 2021-12-22: 1000 mL via INTRAVENOUS

## 2021-12-22 NOTE — ED Provider Notes (Signed)
Patient presents for concern of decreased GFR.  Recent lab work shows a creatinine of 1.92.  Chronicity is unknown.  She had a normal creatinine 3 years ago.  She had a recent COVID infection s/p Paxlovid therapy.  COVID symptoms have resolved.  She is currently getting fluid and awaiting urinalysis.  Plan for discharge. Physical Exam  BP 115/74   Pulse 73   Temp 98.1 F (36.7 C) (Oral)   Resp 18   SpO2 100%   Physical Exam Vitals and nursing note reviewed.  Constitutional:      General: She is not in acute distress.    Appearance: Normal appearance. She is well-developed and normal weight. She is not ill-appearing, toxic-appearing or diaphoretic.  HENT:     Head: Normocephalic and atraumatic.     Right Ear: External ear normal.     Left Ear: External ear normal.     Nose: Nose normal.     Mouth/Throat:     Mouth: Mucous membranes are moist.     Pharynx: Oropharynx is clear.  Eyes:     Extraocular Movements: Extraocular movements intact.     Conjunctiva/sclera: Conjunctivae normal.  Cardiovascular:     Rate and Rhythm: Normal rate and regular rhythm.     Heart sounds: No murmur heard. Pulmonary:     Effort: Pulmonary effort is normal. No respiratory distress.  Abdominal:     General: There is no distension.     Palpations: Abdomen is soft.     Tenderness: There is no abdominal tenderness.  Musculoskeletal:        General: No swelling. Normal range of motion.     Cervical back: Normal range of motion and neck supple.     Right lower leg: No edema.     Left lower leg: No edema.  Skin:    General: Skin is warm and dry.     Capillary Refill: Capillary refill takes less than 2 seconds.     Coloration: Skin is not jaundiced or pale.  Neurological:     General: No focal deficit present.     Mental Status: She is alert and oriented to person, place, and time.     Cranial Nerves: No cranial nerve deficit.     Sensory: No sensory deficit.     Motor: No weakness.      Coordination: Coordination normal.  Psychiatric:        Mood and Affect: Mood normal.        Behavior: Behavior normal.        Thought Content: Thought content normal.        Judgment: Judgment normal.     Procedures  Procedures  ED Course / MDM    Medical Decision Making Amount and/or Complexity of Data Reviewed Labs: ordered.   On assessment, patient resting comfortably.  Vital signs are normal.  Although there was hypoxia charted, this does not appear to be accurate.  Patient's SPO2 is normal on assessment.  She has no increased work of breathing.  She denies any recent symptoms.  Findings of elevated creatinine appear to be incidental on a COVID follow-up.  She was advised to follow-up with her primary care doctor for increased creatinine of unknown chronicity.  She was discharged in good condition.       Gloris Manchester, MD 12/22/21 [phone]

## 2021-12-22 NOTE — ED Triage Notes (Signed)
Pt reports she had COVID for about 2 weeks and went to PCP for follow-up. Pt was called today and told to go to ER do to abnormal GFR. Pt denies any symptoms at this time.

## 2022-02-25 ENCOUNTER — Other Ambulatory Visit: Payer: Self-pay | Admitting: Obstetrics and Gynecology

## 2022-02-25 DIAGNOSIS — Z1231 Encounter for screening mammogram for malignant neoplasm of breast: Secondary | ICD-10-CM

## 2022-03-21 MED ORDER — METHYLPREDNISOLONE ACETATE 40 MG/ML IJ SUSP
13.3300 mg | INTRAMUSCULAR | Status: AC | PRN
  Administered 2019-03-29: 13.33 mg

## 2022-03-21 MED ORDER — BUPIVACAINE HCL 0.25 % IJ SOLN
0.3300 mL | INTRAMUSCULAR | Status: AC | PRN
  Administered 2019-03-29: .33 mL

## 2022-03-24 ENCOUNTER — Ambulatory Visit
Admission: RE | Admit: 2022-03-24 | Discharge: 2022-03-24 | Disposition: A | Discharge status: Home / Self Care | Payer: Managed Care, Other (non HMO) | Source: Ambulatory Visit | Attending: Obstetrics and Gynecology | Admitting: Obstetrics and Gynecology

## 2022-03-24 DIAGNOSIS — Z1231 Encounter for screening mammogram for malignant neoplasm of breast: Secondary | ICD-10-CM

## 2022-09-06 ENCOUNTER — Other Ambulatory Visit: Payer: Self-pay

## 2022-09-06 ENCOUNTER — Ambulatory Visit (INDEPENDENT_AMBULATORY_CARE_PROVIDER_SITE_OTHER): Payer: Managed Care, Other (non HMO)

## 2022-09-06 ENCOUNTER — Encounter: Payer: Self-pay | Admitting: Orthopedic Surgery

## 2022-09-06 ENCOUNTER — Ambulatory Visit: Payer: Managed Care, Other (non HMO) | Admitting: Orthopedic Surgery

## 2022-09-06 DIAGNOSIS — M79671 Pain in right foot: Secondary | ICD-10-CM

## 2022-09-06 DIAGNOSIS — M67971 Unspecified disorder of synovium and tendon, right ankle and foot: Secondary | ICD-10-CM | POA: Diagnosis not present

## 2022-09-06 DIAGNOSIS — M7661 Achilles tendinitis, right leg: Secondary | ICD-10-CM

## 2022-09-06 MED ORDER — OXYCODONE-ACETAMINOPHEN 5-325 MG PO TABS: 1.0000 | ORAL_TABLET | Freq: Four times a day (QID) | ORAL | 0 refills | Status: DC | PRN

## 2022-09-06 NOTE — Progress Notes (Signed)
Office Visit Note   Patient: [name]           Date of Birth: [birth year]           MRN: [id number] Visit Date: 09/06/2022              Requested by: Newton Pigg, MD [place],  [place] [address] PCP: Newton Pigg, MD  Chief Complaint  Patient presents with   Right Foot - Pain    Hx talonavicular and subtalar fusion 2021   Right Ankle - Pain      HPI: Patient is a 62 year old woman who presents with extreme pain along her Achilles tendon.  Patient states that it goes from the Achilles up into her calf.  She is 3 years status post talonavicular and subtalar fusion.  Patient has also had 2 lumbar spine surgeries for herniated disc and radicular pain.  Patient states she has tried heel lifts and Voltaren gel without relief.  She has not been on any antibiotics recently.  No history of Cipro.  Assessment & Plan: Visit Diagnoses:  1. Right foot pain   2. Achilles tendon disorder, right     Plan: Will have patient follow-up with Dr. Rolena Infante for evaluation for shockwave therapy.  A prescription for Percocet is provided.  Discussed that if the shockwave therapy does not help we would need to pursue MRI scan of the lumbar spine.  Patient may have recurrent disc pathology.  Follow-Up Instructions: Return if symptoms worsen or fail to improve.   Ortho Exam  Patient is alert, oriented, no adenopathy, well-dressed, normal affect, normal respiratory effort. Examination patient is a good dorsalis pedis pulse there is no swelling or redness in the foot there is no drainage.  Her previous surgical incisions are well-healed.  Patient has minimal tenderness to palpation along the posterior tibial tendon and peroneal tendons.  She is maximally tender to palpation along the course of the Achilles.  She has dorsiflexion to neutral.  She describes this as a burning pain.  Patient has a negative straight leg raise and no radicular symptoms she has focal pain over  the Achilles.  Imaging: XR Foot Complete Right  Result Date: 09/06/2022 Three-view radiographs of the right foot shows a stable well-healed talonavicular and subtalar fusion no lytic changes around the hardware no hardware failure no fibrous union  XR Ankle Complete Right  Result Date: 09/06/2022 2 view radiographs of the right ankle shows a congruent mortise with no osteochondral defects no bony spurs.  No images are attached to the encounter.  Labs: Lab Results  Component Value Date   HGBA1C  05/14/2008    5.6 (NOTE)   The ADA recommends the following therapeutic goal for glycemic   control related to Hgb A1C measurement:   Goal of Therapy:   < 7.0% Hgb A1C   Reference: American Diabetes Association: Clinical Practice   Recommendations 2008, Diabetes Care,  2008, 31:(Suppl 1).   ESRSEDRATE 11 03/15/2019   LABURIC 6.4 03/15/2019   REPTSTATUS 04/20/2016 FINAL 04/15/2016   REPTSTATUS 04/20/2016 FINAL 04/15/2016   CULT  04/15/2016    NO GROWTH 5 DAYS Performed at [place]  04/15/2016    NO GROWTH 5 DAYS Performed at [place] (A) 04/09/2016     Lab Results  Component Value Date   ALBUMIN 3.2 (L) 04/18/2016   ALBUMIN 3.2 (L) 04/16/2016   ALBUMIN  3.3 (L) 04/15/2016    Lab Results  Component Value Date   MG 1.9 04/18/2016   MG 2.0 04/16/2016   MG 2.1 05/16/2008   No results found for: "VD25OH"  No results found for: "PREALBUMIN"    Latest Ref Rng & Units 12/22/2021   12:10 PM 07/11/2019    7:53 AM 03/15/2019   11:34 AM  CBC EXTENDED  WBC 4.0 - 10.5 K/uL 8.6  5.2  4.3   RBC 3.87 - 5.11 MIL/uL 4.80  4.04  4.19   Hemoglobin 12.0 - 15.0 g/dL 13.3  11.7  11.9   HCT 36.0 - 46.0 % 40.9  35.8  36.0   Platelets 150 - 400 K/uL 270  180  232      There is no height or weight on file to calculate BMI.  Orders:  Orders Placed This Encounter  Procedures   XR Foot Complete Right   XR Ankle Complete Right   Meds  ordered this encounter  Medications   oxyCODONE-acetaminophen (PERCOCET/ROXICET) 5-325 MG tablet    Sig: Take 1 tablet by mouth every 6 (six) hours as needed for severe pain.    Dispense:  30 tablet    Refill:  0     Procedures: No procedures performed  Clinical Data: No additional findings.  ROS:  All other systems negative, except as noted in the HPI. Review of Systems  Objective: Vital Signs: There were no vitals taken for this visit.  Specialty Comments:  No specialty comments available.  PMFS History: Patient Active Problem List   Diagnosis Date Noted   H/O ankle fusion 07/18/2019   Posterior tibial tendinitis, right leg    Sinusitis 04/10/2018   Asthma exacerbation 03/28/2018   SOB (shortness of breath)    Pyelonephritis 04/15/2016   Normochromic normocytic anemia 04/15/2016   Elevated LFTs 04/15/2016   HNP (herniated nucleus pulposus), lumbar 07/04/2015    Class: Acute   Anxiety state 08/08/2013   INSOMNIA 05/20/2008   Asthma with bronchitis 05/15/2007   PLEURISY 05/15/2007   HYPERVENTILATION 05/15/2007   COUGH 05/15/2007   Past Medical History:  Diagnosis Date   Anxiety    Asthma    Chronic kidney disease    patient denies - [id number]   Complication of anesthesia    was slow to wake up with one of her surgeries   Cough    Headache    hx. migraines   Hyperventilation    Insomnia    Pleurisy    UTI (urinary tract infection)     Family History  Problem Relation Age of Onset   Prostate cancer Father    Allergic rhinitis Mother    Breast cancer Mother 49   Angioedema Neg Hx    Eczema Neg Hx    Urticaria Neg Hx    Asthma Neg Hx     Past Surgical History:  Procedure Laterality Date   ABDOMINAL HYSTERECTOMY     ANKLE FUSION Right 07/11/2019   Procedure: RIGHT TALONAVICULAR AND SUBTALAR FUSION;  Surgeon: Newt Minion, MD;  Location: [place];  Service: Orthopedics;  Laterality: Right;   LUMBAR LAMINECTOMY N/A 07/04/2015   Procedure: Left L5-S1  MICRODISCECTOMY;  Surgeon: Jessy Oto, MD;  Location: [place];  Service: Orthopedics;  Laterality: N/A;   LUMBAR SPINE SURGERY     UTERINE FIBROID SURGERY     VESICOVAGINAL FISTULA CLOSURE W/ TAH     Social History   Occupational History   Occupation: marketing/sales  Tobacco Use  Smoking status: Never   Smokeless tobacco: Never  Vaping Use   Vaping Use: Never used  Substance and Sexual Activity   Alcohol use: No   Drug use: No   Sexual activity: Not on file

## 2022-09-07 ENCOUNTER — Other Ambulatory Visit: Payer: Self-pay | Admitting: Orthopedic Surgery

## 2022-09-07 MED ORDER — LIDOCAINE 5 % EX PTCH: 1.0000 | MEDICATED_PATCH | CUTANEOUS | 0 refills | Status: AC

## 2022-09-09 ENCOUNTER — Ambulatory Visit: Payer: Managed Care, Other (non HMO) | Admitting: Sports Medicine

## 2022-09-13 ENCOUNTER — Ambulatory Visit: Payer: Managed Care, Other (non HMO) | Admitting: Sports Medicine

## 2022-09-13 ENCOUNTER — Encounter: Payer: Self-pay | Admitting: Sports Medicine

## 2022-09-13 DIAGNOSIS — G90521 Complex regional pain syndrome I of right lower limb: Secondary | ICD-10-CM

## 2022-09-13 DIAGNOSIS — M7661 Achilles tendinitis, right leg: Secondary | ICD-10-CM | POA: Diagnosis not present

## 2022-09-13 DIAGNOSIS — M76821 Posterior tibial tendinitis, right leg: Secondary | ICD-10-CM | POA: Diagnosis not present

## 2022-09-13 DIAGNOSIS — M25571 Pain in right ankle and joints of right foot: Secondary | ICD-10-CM | POA: Diagnosis not present

## 2022-09-13 DIAGNOSIS — G8929 Other chronic pain: Secondary | ICD-10-CM

## 2022-09-13 MED ORDER — MELOXICAM 15 MG PO TABS: 15.0000 mg | ORAL_TABLET | Freq: Every day | ORAL | 0 refills | Status: AC

## 2022-09-13 NOTE — Progress Notes (Signed)
Pain in right foot ever since surgery Worse within the last 6-8 weeks  Has tried injections/insoles/ice/heat with no relief

## 2022-09-13 NOTE — Progress Notes (Signed)
[name] - 62 y.o. female MRN [id number]  Date of birth: 1961-06-15  Office Visit Note: Visit Date: 09/13/2022 PCP: Newton Pigg, MD Referred by: Newt Minion, MD  Subjective: Chief Complaint  Patient presents with   Right Heel - Pain   HPI: [name] is a pleasant 62 y.o. female who presents today for chronic right ankle and achilles pain.  Previously saw Dr. Sharol Given - Patient states that it goes from the Achilles up into her calf. She is 3 years status post talonavicular and subtalar fusion. Patient has also had 2 lumbar spine surgeries for herniated disc and radicular pain. Patient states she has tried heel lifts and Voltaren gel without relief.   Per patient and chart review - she developed a complex regional pain syndrome. Patient initially was treated with Neurontin Cymbalta and Elavil. Tried ketamine infusions without significant relief.  Saw Dr. Junius Roads and had Plantar fascia injection on 02/08/20, also had posterior tibial sheath injection on 06/20/2019.  These helped but only for a short period of time.  She is hopeful for some relief of her pain as she has been dealing with this for the last 3 years.  Pain today is worse over the Achilles medial aspect of the ankle.  Pertinent ROS were reviewed with the patient and found to be negative unless otherwise specified above in HPI.   Assessment & Plan: Visit Diagnoses:  1. Achilles tendinitis, right leg   2. Chronic pain of right ankle   3. Insufficiency of right posterior tibial tendon   4. Complex regional pain syndrome type 1 of right lower extremity    Plan: Discussed with manage the nature of her chronic ankle and foot pain.  It does seem like he likely has a component of CRPS.  She was seen previously by my partner Dr. Sharol Given, he wanted to try to see me for evaluation and consideration of shockwave therapy.  We did proceed with a trial of extracorporeal shockwave therapy to the Achilles and posterior tibial tendon  today.  She will follow-up next week and we will repeat this treatment and see what sort of relief she gets after 2 treatments.  At next visit, I would like to first ultrasound the area to evaluate her posterior tibial tendon and medial ankle/foot tendons, achilles, etc. we will start her back on meloxicam to be taken 15 mg once daily with food.  Discussed good supportive shoe wear.  Other consideration of workup modalities may include EMG/nerve conduction studies of the lower extremity, MRI of the foot/ankle.  Will start with our evaluation and let further eval regarding by her operating surgeon, Dr. Sharol Given.  Follow-up: Return in about 1 week (around 09/20/2022) for for US-evaluation of right ankle (30-min).   Meds & Orders:  Meds ordered this encounter  Medications   meloxicam (MOBIC) 15 MG tablet    Sig: Take 1 tablet (15 mg total) by mouth daily.    Dispense:  30 tablet    Refill:  0   No orders of the defined types were placed in this encounter.    Procedures: Procedure: ECSWT Indications:  Achillodynia, Posterior tibial tendinopathy   Procedure Details Consent: Risks of procedure as well as the alternatives and risks of each were explained to the patient.  Verbal consent for procedure obtained. Time Out: Verified patient identification, verified procedure, site was marked, verified correct patient position. The area was cleaned with alcohol swab.     The achilles tendon and posterior  tibial tendon was targeted for Extracorporeal shockwave therapy.    Preset: Achillodynia Power Level: 90 mJ Frequency: 10 Hz Impulse/cycles: 2500 (1500 at achilles, 1000 at PT) Head size: Regular   Patient tolerated procedure well without immediate complications.        Clinical History: No specialty comments available.  She reports that she has never smoked. She has never used smokeless tobacco. No results for input(s): "HGBA1C", "LABURIC" in the last 8760 hours.  Objective:    Physical  Exam  Gen: Well-appearing, in no acute distress; non-toxic CV:  Well-perfused. Warm.  Resp: Breathing unlabored on room air; no wheezing. Psych: Fluid speech in conversation; appropriate affect; normal thought process Neuro: Sensation intact throughout. No gross coordination deficits.   Ortho Exam - Right foot/ankle: There is a well-healed incision from prior talonavicular and subtalar fusion without evidence of infection.  There is no overlying redness or swelling.  There is positive TTP in the mid belly of the Achilles tendon as well as the course of the posterior tibial tendon as it courses around the medial malleolus and to the medial plantar foot.  She is able to perform bilateral heel raise, posterior tibial tendon does appear to 5 appropriately.  She is able to dorsiflex actively to neutral.  Imaging: *Independent review of three-view foot and ankle x-ray from 09/05/2021 was independently interpreted by myself.  There is a stable talonavicular and subtalar fusion with no hardware abnormalities.  Likely hammertoe deformities noted.  No soft tissue abnormality.  XR Foot Complete Right Three-view radiographs of the right foot shows a stable well-healed  talonavicular and subtalar fusion no lytic changes around the hardware no  hardware failure no fibrous union XR Ankle Complete Right 2 view radiographs of the right ankle shows a congruent mortise with no  osteochondral defects no bony spurs.    Past Medical/Family/Surgical/Social History: Medications & Allergies reviewed per EMR, new medications updated. Patient Active Problem List   Diagnosis Date Noted   H/O ankle fusion 07/18/2019   Posterior tibial tendinitis, right leg    Sinusitis 04/10/2018   Asthma exacerbation 03/28/2018   SOB (shortness of breath)    Pyelonephritis 04/15/2016   Normochromic normocytic anemia 04/15/2016   Elevated LFTs 04/15/2016   HNP (herniated nucleus pulposus), lumbar 07/04/2015    Class: Acute    Anxiety state 08/08/2013   INSOMNIA 05/20/2008   Asthma with bronchitis 05/15/2007   PLEURISY 05/15/2007   HYPERVENTILATION 05/15/2007   COUGH 05/15/2007   Past Medical History:  Diagnosis Date   Anxiety    Asthma    Chronic kidney disease    patient denies - [id number]   Complication of anesthesia    was slow to wake up with one of her surgeries   Cough    Headache    hx. migraines   Hyperventilation    Insomnia    Pleurisy    UTI (urinary tract infection)    Family History  Problem Relation Age of Onset   Prostate cancer Father    Allergic rhinitis Mother    Breast cancer Mother 37   Angioedema Neg Hx    Eczema Neg Hx    Urticaria Neg Hx    Asthma Neg Hx    Past Surgical History:  Procedure Laterality Date   ABDOMINAL HYSTERECTOMY     ANKLE FUSION Right 07/11/2019   Procedure: RIGHT TALONAVICULAR AND SUBTALAR FUSION;  Surgeon: Newt Minion, MD;  Location: [place];  Service: Orthopedics;  Laterality: Right;   LUMBAR  LAMINECTOMY N/A 07/04/2015   Procedure: Left L5-S1 MICRODISCECTOMY;  Surgeon: Jessy Oto, MD;  Location: [facility];  Service: Orthopedics;  Laterality: N/A;   LUMBAR SPINE SURGERY     UTERINE FIBROID SURGERY     VESICOVAGINAL FISTULA CLOSURE W/ TAH     Social History   Occupational History   Occupation: marketing/sales  Tobacco Use   Smoking status: Never   Smokeless tobacco: Never  Vaping Use   Vaping Use: Never used  Substance and Sexual Activity   Alcohol use: No   Drug use: No   Sexual activity: Not on file

## 2022-09-20 ENCOUNTER — Ambulatory Visit: Payer: Managed Care, Other (non HMO) | Admitting: Sports Medicine

## 2022-09-21 ENCOUNTER — Ambulatory Visit: Payer: Managed Care, Other (non HMO) | Admitting: Sports Medicine

## 2022-09-23 ENCOUNTER — Ambulatory Visit: Payer: Managed Care, Other (non HMO) | Admitting: Sports Medicine

## 2022-09-23 ENCOUNTER — Other Ambulatory Visit: Payer: Self-pay

## 2022-09-23 ENCOUNTER — Encounter: Payer: Self-pay | Admitting: Sports Medicine

## 2022-09-23 DIAGNOSIS — M25571 Pain in right ankle and joints of right foot: Secondary | ICD-10-CM | POA: Diagnosis not present

## 2022-09-23 DIAGNOSIS — G8929 Other chronic pain: Secondary | ICD-10-CM

## 2022-09-23 DIAGNOSIS — M7661 Achilles tendinitis, right leg: Secondary | ICD-10-CM | POA: Diagnosis not present

## 2022-09-23 DIAGNOSIS — G90521 Complex regional pain syndrome I of right lower limb: Secondary | ICD-10-CM | POA: Diagnosis not present

## 2022-09-23 NOTE — Progress Notes (Signed)
First few days after last treatment seemed to be doing good; pain is back to where it was.

## 2022-09-23 NOTE — Progress Notes (Signed)
[name] - 62 y.o. female MRN [id number]  Date of birth: 12/28/1960  Office Visit Note: Visit Date: 09/23/2022 PCP: Carol Pigg, MD Referred by: Carol Pigg, MD  Subjective: Chief Complaint  Patient presents with   Right Leg - Pain   HPI: [name] is a pleasant 62 y.o. female who presents today for chronic right ankle pain and Achillodynia.  Previously saw Dr. Sharol Mcdonald - Patient states that it goes from the Achilles up into her calf. She is 3 years status post talonavicular and subtalar fusion. Patient has also had 2 lumbar spine surgeries for herniated disc and radicular pain. Patient states she has tried heel lifts and Voltaren gel without relief.    After our last visit and trial of extracorporeal shockwave therapy, she did note she had some improvement.  Felt pretty good for a few days, still the pain has somewhat returned but feels like she is about 25% improved.  She is back on meloxicam 15 mg daily. Pertinent ROS were reviewed with the patient and found to be negative unless otherwise specified above in HPI.   Assessment & Plan: Visit Diagnoses:  1. Achilles tendinitis, right leg   2. Chronic pain of right ankle   3. Complex regional pain syndrome type 1 of right lower extremity    Plan: Both Tiarra and I were pleased that she feels she got about 25% improvement after the first extracorporeal shockwave treatment, however she is still having pain over the Achilles, improved over the posterior tibial tendon.  Discussed that she does have CRPS and I do not believe shockwave will be super helpful for this.  Mcdonald her improvement, I would like to see [facility] the fact of the shockwave therapy.  We did proceed with repeat treatment today.  She will follow-up next week for reevaluation.  She will continue on her meloxicam 15 mg once daily for the next few weeks.  If she continues to get treatment, we will repeat shockwave therapy going forward.  Other treatment options may be  consideration of low-dose naltrexone.   Other consideration of workup modalities may include EMG/nerve conduction studies of the lower extremity, MRI of the foot/ankle. Will start with our evaluation and let further eval regarding by her operating surgeon, Dr. Sharol Mcdonald.  Especially with discussion regarding hardware removal.  Follow-up: Return in about 1 week (around 09/30/2022) for for right ankle pain.   Meds & Orders: No orders of the defined types were placed in this encounter.   Orders Placed This Encounter  Procedures   [place] Extrem Low Right Ltd     Procedures: Procedure: ECSWT Indications:  Achillodynia, Posterior tibial tendinopathy   Procedure Details Consent: Risks of procedure as well as the alternatives and risks of each were explained to the patient.  Verbal consent for procedure obtained. Time Out: Verified patient identification, verified procedure, site was marked, verified correct patient position. The area was cleaned with alcohol swab.     The achilles tendon and posterior tibial tendon was targeted for Extracorporeal shockwave therapy.    Preset: Achillodynia Power Level: 90 mJ Frequency: 10 Hz Impulse/cycles: 2800 (1500 at achilles, 1000 at PT, 300 over lateral scar on ankle) Head size: Regular   Patient tolerated procedure well without immediate complications.        Clinical History: No specialty comments available.  She reports that she has never smoked. She has never used smokeless tobacco. No results for input(s): "HGBA1C", "LABURIC" in the last 8760 hours.  Objective:  Physical Exam  Gen: Well-appearing, in no acute distress; non-toxic CV:  Well-perfused. Warm.  Resp: Breathing unlabored on room air; no wheezing. Psych: Fluid speech in conversation; appropriate affect; normal thought process Neuro: Sensation intact throughout. No gross coordination deficits.   Ortho Exam - Right ankle: There is a well-healed incision from prior talonavicular and  subtalar fusion without evidence of infection. There is no overlying redness or swelling. There is positive TTP in the mid belly of the Achilles tendon as well as the course of the posterior tibial tendon (although improved from last visit) as it courses around the medial malleolus and to the medial plantar foot. She is able to perform bilateral heel raise, posterior tibial tendon does appear to fire appropriately. She is able to dorsiflex actively to neutral.   Imaging: [place] Extrem Low Right Ltd  Result Date: 09/23/2022 Limited musculoskeletal ultrasound of the right lower extremity, right ankle was performed today.  Evaluation of the medial malleolus demonstrates no cortical irregularity.  Posterior tibial tendon was evaluated in short and long axis without evidence of tearing or tenosynovitis.  Flexor houses longus was evaluated both static and dynamically without evidence of any tearing.  The Achilles was noted with proper insertion on the superior calcaneus.  There is no evidence of tearing, tendinopathy or thickening of the tendon.  Diameter was 0.638 cm in nature.   Past Medical/Family/Surgical/Social History: Medications & Allergies reviewed per EMR, new medications updated. Patient Active Problem List   Diagnosis Date Noted   H/O ankle fusion 07/18/2019   Posterior tibial tendinitis, right leg    Sinusitis 04/10/2018   Asthma exacerbation 03/28/2018   SOB (shortness of breath)    Pyelonephritis 04/15/2016   Normochromic normocytic anemia 04/15/2016   Elevated LFTs 04/15/2016   HNP (herniated nucleus pulposus), lumbar 07/04/2015    Class: Acute   Anxiety state 08/08/2013   INSOMNIA 05/20/2008   Asthma with bronchitis 05/15/2007   PLEURISY 05/15/2007   HYPERVENTILATION 05/15/2007   COUGH 05/15/2007   Past Medical History:  Diagnosis Date   Anxiety    Asthma    Chronic kidney disease    patient denies - [id number]   Complication of anesthesia    was slow to wake up with one of  her surgeries   Cough    Headache    hx. migraines   Hyperventilation    Insomnia    Pleurisy    UTI (urinary tract infection)    Family History  Problem Relation Age of Onset   Prostate cancer Father    Allergic rhinitis Mother    Breast cancer Mother 50   Angioedema Neg Hx    Eczema Neg Hx    Urticaria Neg Hx    Asthma Neg Hx    Past Surgical History:  Procedure Laterality Date   ABDOMINAL HYSTERECTOMY     ANKLE FUSION Right 07/11/2019   Procedure: RIGHT TALONAVICULAR AND SUBTALAR FUSION;  Surgeon: Newt Minion, MD;  Location: [place];  Service: Orthopedics;  Laterality: Right;   LUMBAR LAMINECTOMY N/A 07/04/2015   Procedure: Left L5-S1 MICRODISCECTOMY;  Surgeon: Jessy Oto, MD;  Location: [place];  Service: Orthopedics;  Laterality: N/A;   LUMBAR SPINE SURGERY     UTERINE FIBROID SURGERY     VESICOVAGINAL FISTULA CLOSURE W/ TAH     Social History   Occupational History   Occupation: marketing/sales  Tobacco Use   Smoking status: Never   Smokeless tobacco: Never  Vaping Use   Vaping Use:  Never used  Substance and Sexual Activity   Alcohol use: No   Drug use: No   Sexual activity: Not on file

## 2022-09-30 ENCOUNTER — Ambulatory Visit: Payer: Managed Care, Other (non HMO) | Admitting: Sports Medicine

## 2022-09-30 DIAGNOSIS — M25571 Pain in right ankle and joints of right foot: Secondary | ICD-10-CM

## 2022-09-30 DIAGNOSIS — G8929 Other chronic pain: Secondary | ICD-10-CM

## 2022-09-30 DIAGNOSIS — G90521 Complex regional pain syndrome I of right lower limb: Secondary | ICD-10-CM | POA: Diagnosis not present

## 2022-09-30 DIAGNOSIS — M7661 Achilles tendinitis, right leg: Secondary | ICD-10-CM | POA: Diagnosis not present

## 2022-09-30 NOTE — Progress Notes (Signed)
She is still having pain.  She stated some days are worse than prior to being seen here. Today is a bad day of pain.  She said the first shockwave did help the one last week the pain worsened.  Meloxicam for pain she can't tell If it is helping or not.

## 2022-09-30 NOTE — Progress Notes (Signed)
[name] - 62 y.o. female MRN [id number]  Date of birth: Feb 15, 1961  Office Visit Note: Visit Date: 09/30/2022 PCP: [name], MD Referred by: [name], MD  Subjective: Chief Complaint  Patient presents with   Right Ankle - Pain   HPI: [name] is a pleasant 62 y.o. female who presents today for follow-up of chronic right ankle pain.  She is a patient of Dr. Lajoyce Corners. She is  S/p Right talonavicular and subtalar fusion - subsequently developed CRPS. Has had chronic pain and failed multiple treatment modalities.  After first session of extracorporeal shockwave therapy, she got about 20-25% relief, however repeated a second session last week feels like this did not help and her pain is about back at her baseline.  Still taking the meloxicam but not noticing a big difference.  Pertinent ROS were reviewed with the patient and found to be negative unless otherwise specified above in HPI.   Assessment & Plan: Visit Diagnoses:  1. Chronic pain of right ankle   2. Complex regional pain syndrome type 1 of right lower extremity   3. Achilles tendinitis, right leg    Plan: Discussed with [name] that after 2 treatments of extracorporeal shockwave therapy, she only received temporary relief and her pain is about back to where it was before hand.  We do not think that this would be significantly beneficial for her moving forward.  Her ankle pain is quite complex given her prior surgery with development of CRPS.  We discussed other workup modalities such as MRI of the ankle/foot, EMG/nerve conduction studies.  Ultimately think it is best to get her back to see Dr. Lajoyce Corners for him to decide on next steps.  She did have the question about hardware removal, she will inquire about this with him.  One option I did discuss with her was low-dose naltrexone for chronic pain.  She will do some research on this and if she wishes to proceed with this, she can see me back in one month after  starting her dosing.  Follow-up: Return for with Dr. Lajoyce Corners for chronic R-ankle pain.   Meds & Orders: No orders of the defined types were placed in this encounter.  No orders of the defined types were placed in this encounter.    Procedures: No procedures performed      Clinical History: No specialty comments available.  She reports that she has never smoked. She has never used smokeless tobacco. No results for input(s): "HGBA1C", "LABURIC" in the last 8760 hours.  Objective:    Physical Exam  Gen: Well-appearing, in no acute distress; non-toxic CV: Well-perfused. Warm.  Resp: Breathing unlabored on room air; no wheezing. Psych: Fluid speech in conversation; appropriate affect; normal thought process Neuro: Sensation intact throughout. No gross coordination deficits.   Ortho Exam -  Right foot/ankle: There is a well-healed incision from prior talonavicular and subtalar fusion without evidence of infection.  There is no overlying redness or swelling.  There is positive TTP in the mid belly of the Achilles tendon as well as the course of the posterior tibial tendon as it courses around the medial malleolus and to the medial plantar foot.  She is able to perform bilateral heel raise, posterior tibial tendon does appear to fire appropriately.  She is able to dorsiflex actively to neutral.   Imaging: No results found.  Past Medical/Family/Surgical/Social History: Medications & Allergies reviewed per EMR, new medications updated. Patient Active Problem List   Diagnosis Date  Noted   H/O ankle fusion 07/18/2019   Posterior tibial tendinitis, right leg    Sinusitis 04/10/2018   Asthma exacerbation 03/28/2018   SOB (shortness of breath)    Pyelonephritis 04/15/2016   Normochromic normocytic anemia 04/15/2016   Elevated LFTs 04/15/2016   HNP (herniated nucleus pulposus), lumbar 07/04/2015    Class: Acute   Anxiety state 08/08/2013   INSOMNIA 05/20/2008   Asthma with bronchitis  05/15/2007   PLEURISY 05/15/2007   HYPERVENTILATION 05/15/2007   COUGH 05/15/2007   Past Medical History:  Diagnosis Date   Anxiety    Asthma    Chronic kidney disease    patient denies - 07/09/2019   Complication of anesthesia    was slow to wake up with one of her surgeries   Cough    Headache    hx. migraines   Hyperventilation    Insomnia    Pleurisy    UTI (urinary tract infection)    Family History  Problem Relation Age of Onset   Prostate cancer Father    Allergic rhinitis Mother    Breast cancer Mother 61   Angioedema Neg Hx    Eczema Neg Hx    Urticaria Neg Hx    Asthma Neg Hx    Past Surgical History:  Procedure Laterality Date   ABDOMINAL HYSTERECTOMY     ANKLE FUSION Right 07/11/2019   Procedure: RIGHT TALONAVICULAR AND SUBTALAR FUSION;  Surgeon: Nadara Mustard, MD;  [facility];  Service: Orthopedics;  Laterality: Right;   LUMBAR LAMINECTOMY N/A 07/04/2015   Procedure: Left L5-S1 MICRODISCECTOMY;  Surgeon: Kerrin Champagne, MD;  Location[facility];  Service: Orthopedics;  Laterality: N/A;   LUMBAR SPINE SURGERY     UTERINE FIBROID SURGERY     VESICOVAGINAL FISTULA CLOSURE W/ TAH     Social History   Occupational History   Occupation: marketing/sales  Tobacco Use   Smoking status: Never   Smokeless tobacco: Never  Vaping Use   Vaping Use: Never used  Substance and Sexual Activity   Alcohol use: No   Drug use: No   Sexual activity: Not on file

## 2022-10-01 ENCOUNTER — Encounter: Payer: Self-pay | Admitting: Sports Medicine

## 2022-10-14 ENCOUNTER — Ambulatory Visit: Payer: Managed Care, Other (non HMO) | Admitting: Orthopedic Surgery

## 2022-10-14 ENCOUNTER — Encounter: Payer: Self-pay | Admitting: Orthopedic Surgery

## 2022-10-14 DIAGNOSIS — G90521 Complex regional pain syndrome I of right lower limb: Secondary | ICD-10-CM

## 2022-10-14 DIAGNOSIS — M7661 Achilles tendinitis, right leg: Secondary | ICD-10-CM

## 2022-10-14 DIAGNOSIS — M25571 Pain in right ankle and joints of right foot: Secondary | ICD-10-CM

## 2022-10-14 DIAGNOSIS — G8929 Other chronic pain: Secondary | ICD-10-CM

## 2022-10-14 MED ORDER — NITROGLYCERIN 0.2 MG/HR TD PT24: 0.2000 mg | MEDICATED_PATCH | Freq: Every day | TRANSDERMAL | 12 refills | Status: DC

## 2022-10-14 NOTE — Progress Notes (Signed)
Office Visit Note   Patient: [name]           Date of Birth: 1961-04-28           MRN: [id number] Visit Date: 10/14/2022              Requested by: [name], MD [address] [place],  [place] [address] PCP: [name], MD  Chief Complaint  Patient presents with   Right Ankle - Pain      HPI: Patient is a 62 year old woman who is seen in follow-up for chronic Achilles tendinitis.  Patient has had symptoms consistent with complex regional pain syndrome.  She has had no relief with Elavil Cymbalta or Neurontin.  She recently underwent shockwave therapy without relief.  Patient states she occasionally uses a lidocaine patch.  She has tried Voltaren gel without relief.  Assessment & Plan: Visit Diagnoses:  1. Chronic pain of right ankle   2. Complex regional pain syndrome type 1 of right lower extremity   3. Achilles tendinitis, right leg     Plan: Will try nitroglycerin patch she will start with half a patch a day change daily.  Follow-up in 4 weeks.  While there were no skin color or temperature changes may consider a sympathetic block to help.  Will obtain a uric acid at follow-up.  Follow-Up Instructions: Return in about 4 weeks (around 11/11/2022).   Ortho Exam  Patient is alert, oriented, no adenopathy, well-dressed, normal affect, normal respiratory effort. Examination patient is a good foot she has good range of motion of the ankle with good dorsiflexion no Achilles contracture her previous incisions are well-healed her foot is plantigrade she has no foot pain.  She has pain to palpation directly over the Achilles tendon there is no nodular changes there is no swelling.  Patient states she still doing her physical therapy exercises.  Imaging: No results found. No images are attached to the encounter.  Labs: Lab Results  Component Value Date   HGBA1C  05/14/2008    5.6 (NOTE)   The ADA recommends the following therapeutic goal for  glycemic   control related to Hgb A1C measurement:   Goal of Therapy:   < 7.0% Hgb A1C   Reference: American Diabetes Association: Clinical Practice   Recommendations 2008, Diabetes Care,  2008, 31:(Suppl 1).   ESRSEDRATE 11 03/15/2019   LABURIC 6.4 03/15/2019   REPTSTATUS 04/20/2016 FINAL 04/15/2016   REPTSTATUS 04/20/2016 FINAL 04/15/2016   CULT  04/15/2016    NO GROWTH 5 DAYS Performed at [facility]    CULT  04/15/2016    NO GROWTH 5 DAYS Performed at [facility]    [facility] ESCHERICHIA COLI (A) 04/09/2016     Lab Results  Component Value Date   ALBUMIN 3.2 (L) 04/18/2016   ALBUMIN 3.2 (L) 04/16/2016   ALBUMIN 3.3 (L) 04/15/2016    Lab Results  Component Value Date   MG 1.9 04/18/2016   MG 2.0 04/16/2016   MG 2.1 05/16/2008   No results found for: "VD25OH"  No results found for: "PREALBUMIN"    Latest Ref Rng & Units 12/22/2021   12:10 PM 07/11/2019    7:53 AM 03/15/2019   11:34 AM  CBC EXTENDED  WBC 4.0 - 10.5 K/uL 8.6  5.2  4.3   RBC 3.87 - 5.11 MIL/uL 4.80  4.04  4.19   Hemoglobin 12.0 - 15.0 g/dL 08.6  57.8  46.9   HCT  36.0 - 46.0 % 40.9  35.8  36.0   Platelets 150 - 400 K/uL 270  180  232      There is no height or weight on file to calculate BMI.  Orders:  No orders of the defined types were placed in this encounter.  No orders of the defined types were placed in this encounter.    Procedures: No procedures performed  Clinical Data: No additional findings.  ROS:  All other systems negative, except as noted in the HPI. Review of Systems  Objective: Vital Signs: There were no vitals taken for this visit.  Specialty Comments:  No specialty comments available.  PMFS History: Patient Active Problem List   Diagnosis Date Noted   H/O ankle fusion 07/18/2019   Posterior tibial tendinitis, right leg    Sinusitis 04/10/2018   Asthma exacerbation 03/28/2018   SOB (shortness of breath)    Pyelonephritis 04/15/2016    Normochromic normocytic anemia 04/15/2016   Elevated LFTs 04/15/2016   HNP (herniated nucleus pulposus), lumbar 07/04/2015    Class: Acute   Anxiety state 08/08/2013   INSOMNIA 05/20/2008   Asthma with bronchitis 05/15/2007   PLEURISY 05/15/2007   HYPERVENTILATION 05/15/2007   COUGH 05/15/2007   Past Medical History:  Diagnosis Date   Anxiety    Asthma    Chronic kidney disease    patient denies - 07/09/2019   Complication of anesthesia    was slow to wake up with one of her surgeries   Cough    Headache    hx. migraines   Hyperventilation    Insomnia    Pleurisy    UTI (urinary tract infection)     Family History  Problem Relation Age of Onset   Prostate cancer Father    Allergic rhinitis Mother    Breast cancer Mother 33   Angioedema Neg Hx    Eczema Neg Hx    Urticaria Neg Hx    Asthma Neg Hx     Past Surgical History:  Procedure Laterality Date   ABDOMINAL HYSTERECTOMY     ANKLE FUSION Right 07/11/2019   Procedure: RIGHT TALONAVICULAR AND SUBTALAR FUSION;  Surgeon: Nadara Mustard, MD;  [facility];  Service: Orthopedics;  Laterality: Right;   LUMBAR LAMINECTOMY N/A 07/04/2015   Procedure: Left L5-S1 MICRODISCECTOMY;  Surgeon: Kerrin Champagne, MD;  [facility];  Service: Orthopedics;  Laterality: N/A;   LUMBAR SPINE SURGERY     UTERINE FIBROID SURGERY     VESICOVAGINAL FISTULA CLOSURE W/ TAH     Social History   Occupational History   Occupation: marketing/sales  Tobacco Use   Smoking status: Never   Smokeless tobacco: Never  Vaping Use   Vaping Use: Never used  Substance and Sexual Activity   Alcohol use: No   Drug use: No   Sexual activity: Not on file

## 2022-11-18 ENCOUNTER — Encounter: Payer: Self-pay | Admitting: Orthopedic Surgery

## 2022-11-18 ENCOUNTER — Ambulatory Visit: Payer: Managed Care, Other (non HMO) | Admitting: Orthopedic Surgery

## 2022-11-18 DIAGNOSIS — M7661 Achilles tendinitis, right leg: Secondary | ICD-10-CM

## 2022-11-18 DIAGNOSIS — G8929 Other chronic pain: Secondary | ICD-10-CM | POA: Diagnosis not present

## 2022-11-18 DIAGNOSIS — M25571 Pain in right ankle and joints of right foot: Secondary | ICD-10-CM

## 2022-11-18 MED ORDER — LIDOCAINE HCL 1 % IJ SOLN
2.0000 mL | INTRAMUSCULAR | Status: AC | PRN
  Administered 2022-11-18: 2 mL

## 2022-11-18 MED ORDER — METHYLPREDNISOLONE ACETATE 40 MG/ML IJ SUSP
40.0000 mg | INTRAMUSCULAR | Status: AC | PRN
  Administered 2022-11-18: 40 mg via INTRA_ARTICULAR

## 2022-11-18 NOTE — Progress Notes (Signed)
Office Visit Note   Patient: [name]           Date of Birth: 07/05/1960           MRN: [id number] Visit Date: 11/18/2022              Requested by: [name], MD [address] [place],  [place] [address] PCP: [name], MD  Chief Complaint  Patient presents with   Right Ankle - Follow-up      HPI: Patient is a 62 year old woman who presents with persistent Achilles tendinopathy that is worse with pushoff.  We have tried anti-inflammatories as well as nitroglycerin patch that caused a headache.  Patient's uric acid was 6.4 and 2020.  Assessment & Plan: Visit Diagnoses:  1. Chronic pain of right ankle   2. Achilles tendinitis, right leg     Plan: Will draw a uric acid level and start medical treatment if uric acid greater than 6.  The Achilles tendon was injected with 1 cc Depo-Medrol and 1 cc of 1% lidocaine.  Follow-Up Instructions: Return in about 4 weeks (around 12/16/2022).   Ortho Exam  Patient is alert, oriented, no adenopathy, well-dressed, normal affect, normal respiratory effort. Examination patient has excellent range of motion of the ankle there is no skin color or temperature changes.  Patient has no palpable defects or nodules in the Achilles but this is the area where she is symptomatic with pushoff.  Imaging: No results found. No images are attached to the encounter.  Labs: Lab Results  Component Value Date   HGBA1C  05/14/2008    5.6 (NOTE)   The ADA recommends the following therapeutic goal for glycemic   control related to Hgb A1C measurement:   Goal of Therapy:   < 7.0% Hgb A1C   Reference: American Diabetes Association: Clinical Practice   Recommendations 2008, Diabetes Care,  2008, 31:(Suppl 1).   ESRSEDRATE 11 03/15/2019   LABURIC 6.4 03/15/2019   REPTSTATUS 04/20/2016 FINAL 04/15/2016   REPTSTATUS 04/20/2016 FINAL 04/15/2016   CULT  04/15/2016    NO GROWTH 5 DAYS Performed at [facility]    CULT   04/15/2016    NO GROWTH 5 DAYS Performed at [facility]    [facility] ESCHERICHIA COLI (A) 04/09/2016     Lab Results  Component Value Date   ALBUMIN 3.2 (L) 04/18/2016   ALBUMIN 3.2 (L) 04/16/2016   ALBUMIN 3.3 (L) 04/15/2016    Lab Results  Component Value Date   MG 1.9 04/18/2016   MG 2.0 04/16/2016   MG 2.1 05/16/2008   No results found for: "VD25OH"  No results found for: "PREALBUMIN"    Latest Ref Rng & Units 12/22/2021   12:10 PM 07/11/2019    7:53 AM 03/15/2019   11:34 AM  CBC EXTENDED  WBC 4.0 - 10.5 K/uL 8.6  5.2  4.3   RBC 3.87 - 5.11 MIL/uL 4.80  4.04  4.19   Hemoglobin 12.0 - 15.0 g/dL 78.2  95.6  21.3   HCT 36.0 - 46.0 % 40.9  35.8  36.0   Platelets 150 - 400 K/uL 270  180  232      There is no height or weight on file to calculate BMI.  Orders:  No orders of the defined types were placed in this encounter.  No orders of the defined types were placed in this encounter.    Procedures: Medium Joint Inj on 11/18/2022 4:37 PM  Indications: pain and diagnostic evaluation Details: 22 G 1.5 in needle, posterior approach Medications: 2 mL lidocaine 1 %; 40 mg methylPREDNISolone acetate 40 MG/ML Outcome: tolerated well, no immediate complications Procedure, treatment alternatives, risks and benefits explained, specific risks discussed. Consent was given by the patient. Immediately prior to procedure a time out was called to verify the correct patient, procedure, equipment, support staff and site/side marked as required. Patient was prepped and draped in the usual sterile fashion.      Clinical Data: No additional findings.  ROS:  All other systems negative, except as noted in the HPI. Review of Systems  Objective: Vital Signs: There were no vitals taken for this visit.  Specialty Comments:  No specialty comments available.  PMFS History: Patient Active Problem List   Diagnosis Date Noted   H/O ankle fusion 07/18/2019   Posterior tibial  tendinitis, right leg    Sinusitis 04/10/2018   Asthma exacerbation 03/28/2018   SOB (shortness of breath)    Pyelonephritis 04/15/2016   Normochromic normocytic anemia 04/15/2016   Elevated LFTs 04/15/2016   HNP (herniated nucleus pulposus), lumbar 07/04/2015    Class: Acute   Anxiety state 08/08/2013   INSOMNIA 05/20/2008   Asthma with bronchitis 05/15/2007   PLEURISY 05/15/2007   HYPERVENTILATION 05/15/2007   COUGH 05/15/2007   Past Medical History:  Diagnosis Date   Anxiety    Asthma    Chronic kidney disease    patient denies - 07/09/2019   Complication of anesthesia    was slow to wake up with one of her surgeries   Cough    Headache    hx. migraines   Hyperventilation    Insomnia    Pleurisy    UTI (urinary tract infection)     Family History  Problem Relation Age of Onset   Prostate cancer Father    Allergic rhinitis Mother    Breast cancer Mother 65   Angioedema Neg Hx    Eczema Neg Hx    Urticaria Neg Hx    Asthma Neg Hx     Past Surgical History:  Procedure Laterality Date   ABDOMINAL HYSTERECTOMY     ANKLE FUSION Right 07/11/2019   Procedure: RIGHT TALONAVICULAR AND SUBTALAR FUSION;  Surgeon: Nadara Mustard, MD;  [facility];  Service: Orthopedics;  Laterality: Right;   LUMBAR LAMINECTOMY N/A 07/04/2015   Procedure: Left L5-S1 MICRODISCECTOMY;  Surgeon: Kerrin Champagne, MD;  [facility];  Service: Orthopedics;  Laterality: N/A;   LUMBAR SPINE SURGERY     UTERINE FIBROID SURGERY     VESICOVAGINAL FISTULA CLOSURE W/ TAH     Social History   Occupational History   Occupation: marketing/sales  Tobacco Use   Smoking status: Never   Smokeless tobacco: Never  Vaping Use   Vaping Use: Never used  Substance and Sexual Activity   Alcohol use: No   Drug use: No   Sexual activity: Not on file

## 2022-11-18 NOTE — Addendum Note (Signed)
Addended by: Javier Glazier on: 11/18/2022 04:45 PM   Modules accepted: Orders

## 2022-11-19 ENCOUNTER — Telehealth: Payer: Self-pay | Admitting: Orthopedic Surgery

## 2022-11-19 LAB — URIC ACID: Uric Acid, Serum: 6.1 mg/dL (ref 2.5–7.0)

## 2022-11-19 MED ORDER — ALLOPURINOL 100 MG PO TABS: 100.0000 mg | ORAL_TABLET | Freq: Every day | ORAL | 3 refills | Status: DC

## 2022-11-19 NOTE — Telephone Encounter (Signed)
Pt informed. Rx sent to [facility].

## 2022-11-19 NOTE — Telephone Encounter (Signed)
Carol Mustard, MD  Forrest, [name], RMA Uric acid elevated at 6.1.  Call patient and start her on allopurinol 100 mg daily.

## 2022-11-19 NOTE — Addendum Note (Signed)
Addended by: Javier Glazier on: 11/19/2022 02:03 PM   Modules accepted: Orders

## 2022-12-23 ENCOUNTER — Encounter: Payer: Self-pay | Admitting: Orthopedic Surgery

## 2022-12-23 ENCOUNTER — Ambulatory Visit: Payer: Managed Care, Other (non HMO) | Admitting: Orthopedic Surgery

## 2022-12-23 DIAGNOSIS — M7661 Achilles tendinitis, right leg: Secondary | ICD-10-CM | POA: Diagnosis not present

## 2022-12-23 MED ORDER — OXYCODONE-ACETAMINOPHEN 5-325 MG PO TABS
1.0000 | ORAL_TABLET | Freq: Three times a day (TID) | ORAL | 0 refills | Status: DC | PRN
Start: 2022-12-23 — End: 2023-07-07

## 2022-12-23 NOTE — Progress Notes (Signed)
Office Visit Note   Patient: [name]           Date of Birth: 27-May-1961           MRN: [id number] Visit Date: 12/23/2022              Requested by: [name], MD [address] [place],  [place] [address] PCP: [name], MD  Chief Complaint  Patient presents with   Right Ankle - Follow-up      HPI: Patient is a 62 year old woman with persistent Achilles tendinitis on the right.  Patient has undergone prolonged conservative therapy she has used nitroglycerin patches without relief she recently had a steroid injection which lasted about 2 weeks.  She has had shockwave therapy that provided temporary relief.  Of note the ultrasound showed normal tendon structure without swelling without thickening without cystic changes.  Patient does wear shoe with a heel lift and this does help.  She still uses Voltaren gel gel.  She did have a slightly elevated uric acid and tried allopurinol but this did not help.  She has tried good feet orthotics without relief.  Assessment & Plan: Visit Diagnoses:  1. Achilles tendinitis, right leg     Plan: Recommended continue conservative therapy with elevated heels Voltaren gel.  Discussed that a surgical intervention would be a gastrocnemius recession to unload pressure from the Achilles.  Discussed that this may provide some relief.  A refill prescription for oxycodone was called in.  Follow-Up Instructions: Return if symptoms worsen or fail to improve.   Ortho Exam  Patient is alert, oriented, no adenopathy, well-dressed, normal affect, normal respiratory effort. Examination patient has good pulses the Achilles tendon has normal size and shape no nodules no skin defects.  She has dorsiflexion 10 degrees past neutral with her knee extended.  Imaging: No results found. No images are attached to the encounter.  Labs: Lab Results  Component Value Date   HGBA1C  05/14/2008    5.6 (NOTE)   The ADA recommends the  following therapeutic goal for glycemic   control related to Hgb A1C measurement:   Goal of Therapy:   < 7.0% Hgb A1C   Reference: American Diabetes Association: Clinical Practice   Recommendations 2008, Diabetes Care,  2008, 31:(Suppl 1).   ESRSEDRATE 11 03/15/2019   LABURIC 6.1 11/18/2022   LABURIC 6.4 03/15/2019   REPTSTATUS 04/20/2016 FINAL 04/15/2016   REPTSTATUS 04/20/2016 FINAL 04/15/2016   CULT  04/15/2016    NO GROWTH 5 DAYS Performed at [facility]    CULT  04/15/2016    NO GROWTH 5 DAYS Performed at [facility]    [facility] ESCHERICHIA COLI (A) 04/09/2016     Lab Results  Component Value Date   ALBUMIN 3.2 (L) 04/18/2016   ALBUMIN 3.2 (L) 04/16/2016   ALBUMIN 3.3 (L) 04/15/2016    Lab Results  Component Value Date   MG 1.9 04/18/2016   MG 2.0 04/16/2016   MG 2.1 05/16/2008   No results found for: "VD25OH"  No results found for: "PREALBUMIN"    Latest Ref Rng & Units 12/22/2021   12:10 PM 07/11/2019    7:53 AM 03/15/2019   11:34 AM  CBC EXTENDED  WBC 4.0 - 10.5 K/uL 8.6  5.2  4.3   RBC 3.87 - 5.11 MIL/uL 4.80  4.04  4.19   Hemoglobin 12.0 - 15.0 g/dL 74.2  59.5  63.8   HCT 36.0 - 46.0 %  40.9  35.8  36.0   Platelets 150 - 400 K/uL 270  180  232      There is no height or weight on file to calculate BMI.  Orders:  No orders of the defined types were placed in this encounter.  Meds ordered this encounter  Medications   oxyCODONE-acetaminophen (PERCOCET/ROXICET) 5-325 MG tablet    Sig: Take 1 tablet by mouth every 8 (eight) hours as needed for severe pain.    Dispense:  20 tablet    Refill:  0     Procedures: No procedures performed  Clinical Data: No additional findings.  ROS:  All other systems negative, except as noted in the HPI. Review of Systems  Objective: Vital Signs: There were no vitals taken for this visit.  Specialty Comments:  No specialty comments available.  PMFS History: Patient Active Problem List    Diagnosis Date Noted   H/O ankle fusion 07/18/2019   Posterior tibial tendinitis, right leg    Sinusitis 04/10/2018   Asthma exacerbation 03/28/2018   SOB (shortness of breath)    Pyelonephritis 04/15/2016   Normochromic normocytic anemia 04/15/2016   Elevated LFTs 04/15/2016   HNP (herniated nucleus pulposus), lumbar 07/04/2015    Class: Acute   Anxiety state 08/08/2013   INSOMNIA 05/20/2008   Asthma with bronchitis 05/15/2007   PLEURISY 05/15/2007   HYPERVENTILATION 05/15/2007   COUGH 05/15/2007   Past Medical History:  Diagnosis Date   Anxiety    Asthma    Chronic kidney disease    patient denies - 07/09/2019   Complication of anesthesia    was slow to wake up with one of her surgeries   Cough    Headache    hx. migraines   Hyperventilation    Insomnia    Pleurisy    UTI (urinary tract infection)     Family History  Problem Relation Age of Onset   Prostate cancer Father    Allergic rhinitis Mother    Breast cancer Mother 19   Angioedema Neg Hx    Eczema Neg Hx    Urticaria Neg Hx    Asthma Neg Hx     Past Surgical History:  Procedure Laterality Date   ABDOMINAL HYSTERECTOMY     ANKLE FUSION Right 07/11/2019   Procedure: RIGHT TALONAVICULAR AND SUBTALAR FUSION;  Surgeon: Nadara Mustard, MD;  Location: [facility] OR;  Service: Orthopedics;  Laterality: Right;   LUMBAR LAMINECTOMY N/A 07/04/2015   Procedure: Left L5-S1 MICRODISCECTOMY;  Surgeon: Kerrin Champagne, MD;  Location: [facility] OR;  Service: Orthopedics;  Laterality: N/A;   LUMBAR SPINE SURGERY     UTERINE FIBROID SURGERY     VESICOVAGINAL FISTULA CLOSURE W/ TAH     Social History   Occupational History   Occupation: marketing/sales  Tobacco Use   Smoking status: Never   Smokeless tobacco: Never  Vaping Use   Vaping Use: Never used  Substance and Sexual Activity   Alcohol use: No   Drug use: No   Sexual activity: Not on file

## 2023-01-20 ENCOUNTER — Ambulatory Visit: Payer: Managed Care, Other (non HMO) | Admitting: Orthopedic Surgery

## 2023-03-30 ENCOUNTER — Other Ambulatory Visit: Payer: Self-pay | Admitting: Obstetrics and Gynecology

## 2023-03-30 DIAGNOSIS — Z0000 Encounter for general adult medical examination without abnormal findings: Secondary | ICD-10-CM

## 2023-03-31 ENCOUNTER — Ambulatory Visit
Admission: RE | Admit: 2023-03-31 | Discharge: 2023-03-31 | Disposition: A | Discharge status: Home / Self Care | Payer: Managed Care, Other (non HMO) | Source: Ambulatory Visit | Attending: Obstetrics and Gynecology | Admitting: Obstetrics and Gynecology

## 2023-03-31 DIAGNOSIS — Z0000 Encounter for general adult medical examination without abnormal findings: Secondary | ICD-10-CM

## 2023-05-23 ENCOUNTER — Other Ambulatory Visit: Payer: Self-pay | Admitting: Family

## 2023-05-23 ENCOUNTER — Ambulatory Visit
Admission: RE | Admit: 2023-05-23 | Discharge: 2023-05-23 | Disposition: A | Discharge status: Home / Self Care | Payer: Managed Care, Other (non HMO) | Source: Ambulatory Visit | Attending: Family | Admitting: Family

## 2023-05-23 DIAGNOSIS — R0602 Shortness of breath: Secondary | ICD-10-CM

## 2023-06-07 ENCOUNTER — Encounter (HOSPITAL_BASED_OUTPATIENT_CLINIC_OR_DEPARTMENT_OTHER): Payer: Self-pay | Admitting: Pulmonary Disease

## 2023-06-07 ENCOUNTER — Other Ambulatory Visit (HOSPITAL_BASED_OUTPATIENT_CLINIC_OR_DEPARTMENT_OTHER): Payer: Self-pay | Admitting: Pulmonary Disease

## 2023-06-07 ENCOUNTER — Ambulatory Visit (HOSPITAL_BASED_OUTPATIENT_CLINIC_OR_DEPARTMENT_OTHER): Payer: Managed Care, Other (non HMO) | Admitting: Pulmonary Disease

## 2023-06-07 VITALS — BP 140/82 | HR 95 | Resp 16 | Ht 65.0 in | Wt 173.0 lb

## 2023-06-07 DIAGNOSIS — J383 Other diseases of vocal cords: Secondary | ICD-10-CM | POA: Diagnosis not present

## 2023-06-07 DIAGNOSIS — R053 Chronic cough: Secondary | ICD-10-CM

## 2023-06-07 DIAGNOSIS — Z23 Encounter for immunization: Secondary | ICD-10-CM | POA: Diagnosis not present

## 2023-06-07 MED ORDER — CHLORPHENIRAMINE MALEATE 4 MG PO TABS: 4.0000 mg | ORAL_TABLET | Freq: Two times a day (BID) | ORAL | 0 refills | Status: AC | PRN

## 2023-06-07 MED ORDER — PANTOPRAZOLE SODIUM 20 MG PO TBEC: 20.0000 mg | DELAYED_RELEASE_TABLET | Freq: Every day | ORAL | 0 refills | Status: DC

## 2023-06-07 NOTE — Progress Notes (Signed)
Subjective:    Patient ID: [name], female    DOB: Sep 30, 1960, 62 y.o.   MRN: [id number]  HPI  62 year old never smoker presents for evaluation of hoarseness and chronic cough  Chief Complaint  Patient presents with   Consult    Asthma-Past 6 weeks she has had hoarseness, former pt of Dr young. She has been doing really good for a long time and then it started with this again.   Discussed the use of AI scribe software for clinical note transcription with the patient, who gave verbal consent to proceed.  History of Present Illness   The patient, with a history of asthma, presents with a seven-week history of hoarseness. The onset was sudden and without any preceding symptoms such as a cold, runny nose, or sore throat. The patient reports that similar episodes have occurred in the past, usually progressing to a severe cough. However, this episode is unique in its duration and the absence of a severe cough. Over the past weekend, the patient developed a cough and chest tightness. The patient has been managing the symptoms with Breztri, a nebulizer, and prednisone. The patient denies any changes in their environment, stress levels, or work situation that could have triggered this episode. The patient also denies any symptoms of reflux or nasal drip.      I reviewed previous visit with APP and Dr. Maple Hudson, there has been a significant component of anxiety noted on previous visits which does not seem to be present today. She works in [employer], as a Merchandiser, retail, lifetime never smoker  Significant tests/ events reviewed  Spirometry 04/2017 normal CT chest 03/2018 normal  Past Medical History:  Diagnosis Date   Anxiety    Asthma    Chronic kidney disease    patient denies - 07/09/2019   Complication of anesthesia    was slow to wake up with one of her surgeries   Cough    Headache    hx. migraines   Hyperventilation    Insomnia    Pleurisy    UTI (urinary tract infection)      Past Surgical History:  Procedure Laterality Date   ABDOMINAL HYSTERECTOMY     ANKLE FUSION Right 07/11/2019   Procedure: RIGHT TALONAVICULAR AND SUBTALAR FUSION;  Surgeon: Nadara Mustard, MD;  Location: [facility] OR;  Service: Orthopedics;  Laterality: Right;   LUMBAR LAMINECTOMY N/A 07/04/2015   Procedure: Left L5-S1 MICRODISCECTOMY;  Surgeon: Kerrin Champagne, MD;  Location: [facility] OR;  Service: Orthopedics;  Laterality: N/A;   LUMBAR SPINE SURGERY     UTERINE FIBROID SURGERY     VESICOVAGINAL FISTULA CLOSURE W/ TAH      Allergies  Allergen Reactions   Doxycycline Anaphylaxis   Metoclopramide Hcl Shortness Of Breath and Swelling   Prochlorperazine Edisylate Anaphylaxis   Erythromycin Base    Macrobid  [Nitrofurantoin Macrocrystal]    Social History   Socioeconomic History   Marital status: Single    Spouse name: Not on file   Number of children: 0   Years of education: Not on file   Highest education level: Not on file  Occupational History   Occupation: marketing/sales  Tobacco Use   Smoking status: Never   Smokeless tobacco: Never  Vaping Use   Vaping status: Never Used  Substance and Sexual Activity   Alcohol use: No   Drug use: No   Sexual activity: Not on file  Other Topics Concern   Not on file  Social History  Narrative   Not on file   Social Determinants of Health   Financial Resource Strain: Not on file  Food Insecurity: Not on file  Transportation Needs: Not on file  Physical Activity: Not on file  Stress: Not on file  Social Connections: Unknown (11/08/2021)   Received from [facility], [facility]   Social Network    Social Network: Not on file  Intimate Partner Violence: Unknown (09/30/2021)   Received from [facility], [facility]   HITS    Physically Hurt: Not on file    Insult or Talk Down To: Not on file    Threaten Physical Harm: Not on file    Scream or Curse: Not on file   Family History  Problem Relation Age of Onset   Prostate cancer  Father    Allergic rhinitis Mother    Breast cancer Mother 65   Angioedema Neg Hx    Eczema Neg Hx    Urticaria Neg Hx    Asthma Neg Hx      Review of Systems Constitutional: negative for anorexia, fevers and sweats  Eyes: negative for irritation, redness and visual disturbance  Ears, nose, mouth, throat, and face: negative for earaches, epistaxis, nasal congestion and sore throat  Respiratory: negative for sputum and wheezing  Cardiovascular: negative for chest pain, dyspnea, lower extremity edema, orthopnea, palpitations and syncope  Gastrointestinal: negative for abdominal pain, constipation, diarrhea, melena, nausea and vomiting  Genitourinary:negative for dysuria, frequency and hematuria  Hematologic/lymphatic: negative for bleeding, easy bruising and lymphadenopathy  Musculoskeletal:negative for arthralgias, muscle weakness and stiff joints  Neurological: negative for coordination problems, gait problems, headaches and weakness  Endocrine: negative for diabetic symptoms including polydipsia, polyuria and weight loss     Objective:   Physical Exam   Gen. Pleasant, well-nourished, in no distress, normal affect ENT - no pallor,icterus, no post nasal drip Neck: No JVD, no thyromegaly, no carotid bruits, no stridor on panting Lungs: no use of accessory muscles, no dullness to percussion, clear without rales or rhonchi  Cardiovascular: Rhythm regular, heart sounds  normal, no murmurs or gallops, no peripheral edema Abdomen: soft and non-tender, no hepatosplenomegaly, BS normal. Musculoskeletal: No deformities, no cyanosis or clubbing Neuro:  alert, non focal        Assessment & Plan:   Assessment and Plan    Vocal Cord Dysfunction Chronic hoarseness for seven weeks without preceding cold symptoms. Recent cough with chest tightness and headache. Similar past episodes resolved with nebulizer, inhaler, and Tussion X. Current episode is more prolonged. Lungs clear on  examination. Likely vocal cord dysfunction exacerbation. Discussed voice rest, warm gargles, inhalers, potential reflux management with Protonix or Prilosec, and post-nasal drip management with chlortrimeton. ENT referral if symptoms persist. - Use Symbicort or Breztri, two puffs twice daily - Rest voice - Perform warm gargles - Use nebulizer if wheezing develops - Prescribe Protonix or Prilosec for potential reflux for three to four weeks - Prescribe chlortrimeton for potential post-nasal drip - Use Delsym OTC for cough - Consider stronger cough syrup if cough worsens - Refer to ENT if hoarseness persists beyond Christmas  Asthma Intermittent use of Symbicort for asthma symptoms. Recent onset of wheezing. Discussed nebulizer use and potential need for prednisone if symptoms do not improve. - Continue Symbicort or Breztri, two puffs twice daily - Use nebulizer if wheezing develops - Consider prednisone if nebulizer is ineffective  General Health Maintenance No significant changes in health status or environment. No allergies or heartburn. Last  significant medical event was foot surgery in 2021. - Monitor for new symptoms or changes in health status  Follow-up - Contact if cough worsens - Contact if hoarseness persists beyond Christmas - Consider ENT referral if symptoms do not improve.

## 2023-06-07 NOTE — Patient Instructions (Addendum)
X  Prescribe Protonix or Prilosec for potential reflux for three to four weeks X  Prescribe chlortrimeton 4mg  at bedtime  for potential post-nasal drip - Use Delsym OTC for cough - Consider stronger cough syrup if cough worsens - Refer to ENT  (Dr Delford Field at [facility]) if hoarseness persists beyond Christmas

## 2023-06-07 NOTE — Telephone Encounter (Signed)
Pharmacy states the patient is requesting 90 day supply.

## 2023-07-07 ENCOUNTER — Other Ambulatory Visit (HOSPITAL_BASED_OUTPATIENT_CLINIC_OR_DEPARTMENT_OTHER): Payer: Self-pay

## 2023-07-07 ENCOUNTER — Ambulatory Visit (HOSPITAL_BASED_OUTPATIENT_CLINIC_OR_DEPARTMENT_OTHER): Payer: Managed Care, Other (non HMO) | Admitting: Pulmonary Disease

## 2023-07-07 ENCOUNTER — Encounter (HOSPITAL_BASED_OUTPATIENT_CLINIC_OR_DEPARTMENT_OTHER): Payer: Self-pay | Admitting: Pulmonary Disease

## 2023-07-07 VITALS — BP 124/80 | HR 88 | Ht 65.0 in | Wt 206.4 lb

## 2023-07-07 DIAGNOSIS — J383 Other diseases of vocal cords: Secondary | ICD-10-CM | POA: Diagnosis not present

## 2023-07-07 DIAGNOSIS — R053 Chronic cough: Secondary | ICD-10-CM | POA: Diagnosis not present

## 2023-07-07 MED ORDER — AREXVY 120 MCG/0.5ML IM SUSR
0.5000 mL | Freq: Once | INTRAMUSCULAR | 0 refills | Status: AC
  Filled 2023-07-07: qty 0.5, 1d supply, fill #0

## 2023-07-07 MED ORDER — HYDROCOD POLI-CHLORPHE POLI ER 10-8 MG/5ML PO SUER: 5.0000 mL | Freq: Two times a day (BID) | ORAL | 0 refills | Status: AC | PRN

## 2023-07-07 NOTE — Progress Notes (Signed)
   Subjective:    Patient ID: [name], female    DOB: [birth year], 63 y.o.   MRN: [id number]  HPI  63 yo never smoker for FU of hoarseness and chronic cough   Initial OV 05/2023 Chronic hoarseness for seven weeks without preceding cold symptoms. Recent cough with chest tightness and headache. Similar past episodes resolved with nebulizer, inhaler, and TussioneX. Current episode is more prolonged. Lungs clear on examination. Likely vocal cord dysfunction exacerbation. Discussed voice rest, warm gargles, inhalers, potential reflux management with Protonix  or Prilosec, and post-nasal drip management with chlortrimeton. ENT referral if symptoms persist. - Use Symbicort  or Breztri, two puffs twice daily  Discussed the use of AI scribe software for clinical note transcription with the patient, who gave verbal consent to proceed.  History of Present Illness   The patient, with a history of chronic cough and hoarseness, presents with a persistent dry cough and hoarseness that has been improving. They report that the cough has become more prevalent and they are trying to avoid it worsening as it has previously led to the rupture of blood vessels due to its severity. The hoarseness has been improving and is now only affected when they are out in the cold for extended periods. They have been managing their symptoms with Breyna , breathing treatments, Mucinex ,  Delsym , and chlortramatone. However, they are unsure if the chlortramatone helped. They also take reflux medication regularly.      Significant tests/ events reviewed   Spirometry 04/2017 normal CT chest 03/2018 normal  Review of Systems neg for any significant sore throat, dysphagia, itching, sneezing, nasal congestion or excess/ purulent secretions, fever, chills, sweats, unintended wt loss, pleuritic or exertional cp, hempoptysis, orthopnea pnd or change in chronic leg swelling. Also denies presyncope, palpitations, heartburn, abdominal  pain, nausea, vomiting, diarrhea or change in bowel or urinary habits, dysuria,hematuria, rash, arthralgias, visual complaints, headache, numbness weakness or ataxia.     Objective:   Physical Exam  Gen. Pleasant, obese, in no distress ENT - no lesions, no post nasal drip Neck: No JVD, no thyromegaly, no carotid bruits Lungs: no use of accessory muscles, no dullness to percussion, decreased without rales or rhonchi  Cardiovascular: Rhythm regular, heart sounds  normal, no murmurs or gallops, no peripheral edema Musculoskeletal: No deformities, no cyanosis or clubbing , no tremors       Assessment & Plan:     Assessment and Plan    Chronic Cough with Hoarseness Chronic, nonproductive cough with hoarseness persisting over a month, exacerbated by cold exposure. Previous treatments include Breyna , Mucinex , , Rituxan, Delsom, and chlortramatone. Vocal cord dysfunction noted with past ENT consultations without significant improvement. Discussed Tussionex for acute management, effective in the past but to be avoided long-term due to narcotic nature. Emphasized Symbicort  during flare-ups and continued Protonix  for reflux management. - Prescribe Tussionex for acute cough management for 1-2 weeks. - Advise use of Symbicort  during flare-ups. - Continue Protonix  for reflux management.  General Health Maintenance Over a year since last COVID-19 booster and has not received the RSV vaccine. Discussed the importance and efficacy of these vaccinations, noting the RSV vaccine's two-year duration. - Recommend COVID-19 booster. - Recommend RSV vaccine.

## 2023-07-07 NOTE — Patient Instructions (Signed)
 Tussionex prescription will be sent to pharmacy.  Continue on Symbicort, rinse mouth after use  COVID and RSV shots recommended

## 2024-01-31 ENCOUNTER — Ambulatory Visit (INDEPENDENT_AMBULATORY_CARE_PROVIDER_SITE_OTHER)

## 2024-01-31 ENCOUNTER — Encounter (HOSPITAL_BASED_OUTPATIENT_CLINIC_OR_DEPARTMENT_OTHER): Payer: Self-pay | Admitting: Physician Assistant

## 2024-01-31 ENCOUNTER — Ambulatory Visit (HOSPITAL_BASED_OUTPATIENT_CLINIC_OR_DEPARTMENT_OTHER): Admitting: Physician Assistant

## 2024-01-31 DIAGNOSIS — M79671 Pain in right foot: Secondary | ICD-10-CM

## 2024-01-31 MED ORDER — OXYCODONE-ACETAMINOPHEN 5-325 MG PO TABS: 1.0000 | ORAL_TABLET | Freq: Three times a day (TID) | ORAL | 0 refills | Status: AC | PRN

## 2024-01-31 MED ORDER — LIDOCAINE 5 % EX PTCH: 1.0000 | MEDICATED_PATCH | CUTANEOUS | 0 refills | Status: AC

## 2024-01-31 MED ORDER — MELOXICAM 7.5 MG PO TABS
7.5000 mg | ORAL_TABLET | Freq: Every day | ORAL | 0 refills | Status: AC
Start: 2024-01-31 — End: ?

## 2024-01-31 NOTE — Progress Notes (Signed)
 Office Visit Note   Patient: [name]           Date of Birth: 1960/10/18           MRN: [id number] Visit Date: 01/31/2024              Requested by: Austin Ned, MD [address] [place],  [place] [id number] PCP: Austin Ned, MD  Chief Complaint  Patient presents with   Right Foot - Pain    07/11/19 right talonavicular and subtalar fusion      HPI: [name] is a pleasant 63 year old woman who have seen in the past comes in today complaining of right foot lateral pain.  No new injury she is status post subtalar and talonavicular arthrodesis in 2021.  She had continued problems with pain including her heel and Achilles following this.  She has had immobilizations in cast and boots.  She said that she does not have much pain at rest but she cannot bear weight without pain   Assessment & Plan: Visit Diagnoses:  1. Right foot pain     Plan: [name] is a 63 year old woman who comes in today with a chief complaint of right foot pain.  She has a history of right subtalar and talonavicular arthrodesis with Dr. Harden in 2021.  She has had significant struggles with pain in her Achilles as well as CRPS.  She has had no new injury.  She comes in today she started having pain Thursday and worse every day.  She is try gabapentin  and naproxen  she gets a little tingling and some numbness.  X-rays are reassuring today her exam shows some tenderness over the lateral ankle but no sign of fracture.  She has no pain with palpation more with weightbearing.  I did suggest going back in her boot for a short period of time.  I will give her some lidocaine  patches per her request and we will try her on meloxicam .  She is not to take other anti-inflammatories with the I will give her some Percocet she understands this will be a one-time prescription and if she is to take it sparingly I encouraged her to follow-up with Dr. Harden  Follow-Up Instructions: Return if symptoms worsen or fail to  improve.   Ortho Exam  Patient is alert, oriented, no adenopathy, well-dressed, normal affect, normal respiratory effort. Examination of her right ankle and foot she has well-healed surgical incisions.  No swelling neurovascular intact strong dorsalis pedis pulse she has good dorsiflexion and plantarflexion of her foot and ankle.  Obviously subtalar motion is rigid.  She has a minimal pain to palpation.  No swelling no cellulitis    Imaging: No results found. No images are attached to the encounter.  Labs: Lab Results  Component Value Date   HGBA1C  05/14/2008    5.6 (NOTE)   The ADA recommends the following therapeutic goal for glycemic   control related to Hgb A1C measurement:   Goal of Therapy:   < 7.0% Hgb A1C   Reference: American Diabetes Association: Clinical Practice   Recommendations 2008, Diabetes Care,  2008, 31:(Suppl 1).   ESRSEDRATE 11 03/15/2019   LABURIC 6.1 11/18/2022   LABURIC 6.4 03/15/2019   REPTSTATUS 04/20/2016 FINAL 04/15/2016   REPTSTATUS 04/20/2016 FINAL 04/15/2016   CULT  04/15/2016    NO GROWTH 5 DAYS Performed at [facility]    CULT  04/15/2016    NO GROWTH 5 DAYS Performed at [facility]    LABORGA ESCHERICHIA COLI (A) 04/09/2016     Lab Results  Component Value Date   ALBUMIN 3.2 (L) 04/18/2016   ALBUMIN 3.2 (L) 04/16/2016   ALBUMIN 3.3 (L) 04/15/2016    Lab Results  Component Value Date   MG 1.9 04/18/2016   MG 2.0 04/16/2016   MG 2.1 05/16/2008   No results found for: VD25OH  No results found for: PREALBUMIN    Latest Ref Rng & Units 12/22/2021   12:10 PM 07/11/2019    7:53 AM 03/15/2019   11:34 AM  CBC EXTENDED  WBC 4.0 - 10.5 K/uL 8.6  5.2  4.3   RBC 3.87 - 5.11 MIL/uL 4.80  4.04  4.19   Hemoglobin 12.0 - 15.0 g/dL 86.6  88.2  88.0   HCT 36.0 - 46.0 % 40.9  35.8  36.0   Platelets 150 - 400 K/uL 270  180  232      There is no height or weight on file to calculate BMI.  Orders:  Orders Placed This  Encounter  Procedures   DG Ankle Complete Right   DG Foot Complete Right   Meds ordered this encounter  Medications   meloxicam  (MOBIC ) 7.5 MG tablet    Sig: Take 1 tablet (7.5 mg total) by mouth daily.    Dispense:  30 tablet    Refill:  0   oxyCODONE -acetaminophen  (PERCOCET/ROXICET) 5-325 MG tablet    Sig: Take 1 tablet by mouth every 8 (eight) hours as needed for severe pain (pain score 7-10).    Dispense:  30 tablet    Refill:  0   lidocaine  (LIDODERM ) 5 %    Sig: Place 1 patch onto the skin daily. Remove & Discard patch within 12 hours or as directed by MD    Dispense:  30 patch    Refill:  0     Procedures: No procedures performed  Clinical Data: No additional findings.  ROS:  All other systems negative, except as noted in the HPI. Review of Systems  Objective: Vital Signs: There were no vitals taken for this visit.  Specialty Comments:  No specialty comments available.  PMFS History: Patient Active Problem List   Diagnosis Date Noted   H/O ankle fusion 07/18/2019   Posterior tibial tendinitis, right leg    Sinusitis 04/10/2018   Asthma exacerbation 03/28/2018   SOB (shortness of breath)    Pyelonephritis 04/15/2016   Normochromic normocytic anemia 04/15/2016   Elevated LFTs 04/15/2016   HNP (herniated nucleus pulposus), lumbar 07/04/2015    Class: Acute   Anxiety state 08/08/2013   INSOMNIA 05/20/2008   Asthma with bronchitis 05/15/2007   PLEURISY 05/15/2007   HYPERVENTILATION 05/15/2007   COUGH 05/15/2007   Past Medical History:  Diagnosis Date   Anxiety    Asthma    Chronic kidney disease    patient denies - 07/09/2019   Complication of anesthesia    was slow to wake up with one of her surgeries   Cough    Headache    hx. migraines   Hyperventilation    Insomnia    Pleurisy    UTI (urinary tract infection)     Family History  Problem Relation Age of Onset   Prostate cancer Father    Allergic rhinitis Mother    Breast cancer Mother  52   Angioedema Neg Hx    Eczema Neg Hx    Urticaria Neg Hx    Asthma Neg Hx  Past Surgical History:  Procedure Laterality Date   ABDOMINAL HYSTERECTOMY     ANKLE FUSION Right 07/11/2019   Procedure: RIGHT TALONAVICULAR AND SUBTALAR FUSION;  Surgeon: Carol Carol GAILS, MD;  Location: [facility] OR;  Service: Orthopedics;  Laterality: Right;   LUMBAR LAMINECTOMY N/A 07/04/2015   Procedure: Left L5-S1 MICRODISCECTOMY;  Surgeon: Lynwood FORBES Better, MD;  Location: [facility] OR;  Service: Orthopedics;  Laterality: N/A;   LUMBAR SPINE SURGERY     UTERINE FIBROID SURGERY     VESICOVAGINAL FISTULA CLOSURE W/ TAH     Social History   Occupational History   Occupation: marketing/sales  Tobacco Use   Smoking status: Never   Smokeless tobacco: Never  Vaping Use   Vaping status: Never Used  Substance and Sexual Activity   Alcohol use: No   Drug use: No   Sexual activity: Not on file

## 2024-02-02 ENCOUNTER — Encounter (HOSPITAL_BASED_OUTPATIENT_CLINIC_OR_DEPARTMENT_OTHER): Payer: Self-pay

## 2024-02-03 ENCOUNTER — Encounter (HOSPITAL_BASED_OUTPATIENT_CLINIC_OR_DEPARTMENT_OTHER): Payer: Self-pay

## 2024-02-07 ENCOUNTER — Other Ambulatory Visit (HOSPITAL_BASED_OUTPATIENT_CLINIC_OR_DEPARTMENT_OTHER): Payer: Self-pay | Admitting: Physician Assistant

## 2024-02-07 MED ORDER — GABAPENTIN 300 MG PO CAPS: 300.0000 mg | ORAL_CAPSULE | Freq: Three times a day (TID) | ORAL | 3 refills | Status: AC

## 2024-03-08 ENCOUNTER — Other Ambulatory Visit (HOSPITAL_BASED_OUTPATIENT_CLINIC_OR_DEPARTMENT_OTHER): Payer: Self-pay | Admitting: Physician Assistant

## 2024-04-05 ENCOUNTER — Other Ambulatory Visit: Payer: Self-pay | Admitting: Obstetrics and Gynecology

## 2024-04-05 DIAGNOSIS — Z1231 Encounter for screening mammogram for malignant neoplasm of breast: Secondary | ICD-10-CM

## 2024-04-24 ENCOUNTER — Other Ambulatory Visit: Payer: Self-pay | Admitting: Obstetrics and Gynecology

## 2024-04-24 ENCOUNTER — Ambulatory Visit
Admission: RE | Admit: 2024-04-24 | Discharge: 2024-04-24 | Disposition: A | Source: Ambulatory Visit | Attending: Obstetrics and Gynecology | Admitting: Obstetrics and Gynecology

## 2024-04-24 DIAGNOSIS — Z1231 Encounter for screening mammogram for malignant neoplasm of breast: Secondary | ICD-10-CM

## 2024-04-30 ENCOUNTER — Encounter: Payer: Self-pay | Admitting: Radiology
